# Patient Record
Sex: Male | Born: 1950 | Race: White | Hispanic: No | Marital: Married | Smoking: Former smoker
Health system: Southern US, Community
[De-identification: ages and names within clinical notes are randomized; demographics above are authoritative.]

## PROBLEM LIST (undated history)

## (undated) DIAGNOSIS — N400 Enlarged prostate without lower urinary tract symptoms: Secondary | ICD-10-CM

## (undated) DIAGNOSIS — K7290 Hepatic failure, unspecified without coma: Secondary | ICD-10-CM

## (undated) DIAGNOSIS — I10 Essential (primary) hypertension: Secondary | ICD-10-CM

## (undated) DIAGNOSIS — C801 Malignant (primary) neoplasm, unspecified: Secondary | ICD-10-CM

## (undated) DIAGNOSIS — J189 Pneumonia, unspecified organism: Secondary | ICD-10-CM

## (undated) DIAGNOSIS — E785 Hyperlipidemia, unspecified: Secondary | ICD-10-CM

## (undated) DIAGNOSIS — I2510 Atherosclerotic heart disease of native coronary artery without angina pectoris: Secondary | ICD-10-CM

## (undated) HISTORY — DX: Benign prostatic hyperplasia without lower urinary tract symptoms: N40.0

## (undated) HISTORY — DX: Essential (primary) hypertension: I10

## (undated) HISTORY — PX: CORONARY ARTERY BYPASS GRAFT: SHX141

## (undated) HISTORY — PX: LIVER TRANSPLANT: SHX410

## (undated) HISTORY — PX: COLONOSCOPY W/ POLYPECTOMY: SHX1380

## (undated) HISTORY — DX: Atherosclerotic heart disease of native coronary artery without angina pectoris: I25.10

## (undated) HISTORY — DX: Hyperlipidemia, unspecified: E78.5

---

## 2009-07-09 HISTORY — PX: CORONARY ARTERY BYPASS GRAFT: SHX141

## 2009-07-26 ENCOUNTER — Encounter: Payer: Self-pay | Admitting: Cardiothoracic Surgery

## 2009-07-26 ENCOUNTER — Ambulatory Visit: Payer: Self-pay | Admitting: Cardiothoracic Surgery

## 2009-07-26 ENCOUNTER — Ambulatory Visit (HOSPITAL_COMMUNITY): Admission: RE | Admit: 2009-07-26 | Discharge: 2009-07-26 | Payer: Self-pay | Admitting: Cardiology

## 2009-07-31 ENCOUNTER — Inpatient Hospital Stay (HOSPITAL_COMMUNITY): Admission: RE | Admit: 2009-07-31 | Discharge: 2009-08-05 | Payer: Self-pay | Admitting: Cardiothoracic Surgery

## 2009-08-10 ENCOUNTER — Ambulatory Visit: Payer: Self-pay | Admitting: Cardiothoracic Surgery

## 2009-08-17 ENCOUNTER — Ambulatory Visit: Payer: Self-pay | Admitting: Cardiothoracic Surgery

## 2009-08-24 ENCOUNTER — Encounter: Admission: RE | Admit: 2009-08-24 | Discharge: 2009-08-24 | Payer: Self-pay | Admitting: Cardiothoracic Surgery

## 2009-08-24 ENCOUNTER — Ambulatory Visit: Payer: Self-pay | Admitting: Cardiothoracic Surgery

## 2009-08-27 ENCOUNTER — Ambulatory Visit: Payer: Self-pay | Admitting: Oncology

## 2009-08-30 ENCOUNTER — Encounter (HOSPITAL_COMMUNITY): Admission: RE | Admit: 2009-08-30 | Discharge: 2009-09-07 | Payer: Self-pay | Admitting: Cardiology

## 2009-08-30 LAB — CBC WITH DIFFERENTIAL/PLATELET
BASO%: 1.5 % (ref 0.0–2.0)
Basophils Absolute: 0.1 10*3/uL (ref 0.0–0.1)
EOS%: 14 % — ABNORMAL HIGH (ref 0.0–7.0)
Eosinophils Absolute: 1.1 10*3/uL — ABNORMAL HIGH (ref 0.0–0.5)
HCT: 38.5 % (ref 38.4–49.9)
HGB: 12.9 g/dL — ABNORMAL LOW (ref 13.0–17.1)
LYMPH%: 27.8 % (ref 14.0–49.0)
MCH: 31.8 pg (ref 27.2–33.4)
MCHC: 33.5 g/dL (ref 32.0–36.0)
MCV: 94.9 fL (ref 79.3–98.0)
MONO#: 0.9 10*3/uL (ref 0.1–0.9)
MONO%: 11.4 % (ref 0.0–14.0)
NEUT#: 3.5 10*3/uL (ref 1.5–6.5)
NEUT%: 45.3 % (ref 39.0–75.0)
Platelets: 240 10*3/uL (ref 140–400)
RBC: 4.05 10*6/uL — ABNORMAL LOW (ref 4.20–5.82)
RDW: 14.3 % (ref 11.0–14.6)
WBC: 7.7 10*3/uL (ref 4.0–10.3)
lymph#: 2.1 10*3/uL (ref 0.9–3.3)

## 2009-09-02 LAB — PROTHROMBIN TIME
INR: 1.28 (ref <1.50)
Prothrombin Time: 15.9 seconds — ABNORMAL HIGH (ref 11.6–15.2)

## 2009-09-02 LAB — HEPATIC FUNCTION PANEL
ALT: 24 U/L (ref 0–53)
AST: 30 U/L (ref 0–37)
Albumin: 3.9 g/dL (ref 3.5–5.2)
Alkaline Phosphatase: 92 U/L (ref 39–117)
Bilirubin, Direct: 0.2 mg/dL (ref 0.0–0.3)
Indirect Bilirubin: 0.4 mg/dL (ref 0.0–0.9)
Total Bilirubin: 0.6 mg/dL (ref 0.3–1.2)
Total Protein: 7.4 g/dL (ref 6.0–8.3)

## 2009-09-02 LAB — VON WILLEBRAND PANEL
Factor-VIII Activity: 120 % (ref 50–150)
Ristocetin-Cofactor: 99 % (ref 50–150)
Von Willebrand Ag: 246 % normal — ABNORMAL HIGH (ref 61–164)

## 2009-09-02 LAB — APTT: aPTT: 33 seconds (ref 24–37)

## 2009-09-02 LAB — GAMMA GT: GGT: 199 U/L — ABNORMAL HIGH (ref 7–51)

## 2009-09-02 LAB — FIBRINOGEN: Fibrinogen: 396 mg/dL (ref 204–475)

## 2009-09-03 ENCOUNTER — Ambulatory Visit: Payer: Self-pay | Admitting: Cardiothoracic Surgery

## 2009-09-27 ENCOUNTER — Ambulatory Visit: Payer: Self-pay | Admitting: Oncology

## 2009-10-03 ENCOUNTER — Ambulatory Visit: Payer: Self-pay | Admitting: Cardiothoracic Surgery

## 2010-12-11 LAB — PREPARE CRYOPRECIPITATE

## 2010-12-11 LAB — CBC
HCT: 22.4 % — ABNORMAL LOW (ref 39.0–52.0)
HCT: 23.1 % — ABNORMAL LOW (ref 39.0–52.0)
HCT: 23.8 % — ABNORMAL LOW (ref 39.0–52.0)
HCT: 24.1 % — ABNORMAL LOW (ref 39.0–52.0)
HCT: 24.4 % — ABNORMAL LOW (ref 39.0–52.0)
HCT: 25.5 % — ABNORMAL LOW (ref 39.0–52.0)
HCT: 25.8 % — ABNORMAL LOW (ref 39.0–52.0)
HCT: 26.1 % — ABNORMAL LOW (ref 39.0–52.0)
HCT: 26.3 % — ABNORMAL LOW (ref 39.0–52.0)
HCT: 26.5 % — ABNORMAL LOW (ref 39.0–52.0)
HCT: 27.1 % — ABNORMAL LOW (ref 39.0–52.0)
HCT: 29.5 % — ABNORMAL LOW (ref 39.0–52.0)
HCT: 43.8 % (ref 39.0–52.0)
Hemoglobin: 10.6 g/dL — ABNORMAL LOW (ref 13.0–17.0)
Hemoglobin: 15.4 g/dL (ref 13.0–17.0)
Hemoglobin: 8 g/dL — ABNORMAL LOW (ref 13.0–17.0)
Hemoglobin: 8.2 g/dL — ABNORMAL LOW (ref 13.0–17.0)
Hemoglobin: 8.5 g/dL — ABNORMAL LOW (ref 13.0–17.0)
Hemoglobin: 8.5 g/dL — ABNORMAL LOW (ref 13.0–17.0)
Hemoglobin: 8.7 g/dL — ABNORMAL LOW (ref 13.0–17.0)
Hemoglobin: 8.9 g/dL — ABNORMAL LOW (ref 13.0–17.0)
Hemoglobin: 9.1 g/dL — ABNORMAL LOW (ref 13.0–17.0)
Hemoglobin: 9.1 g/dL — ABNORMAL LOW (ref 13.0–17.0)
Hemoglobin: 9.3 g/dL — ABNORMAL LOW (ref 13.0–17.0)
Hemoglobin: 9.3 g/dL — ABNORMAL LOW (ref 13.0–17.0)
Hemoglobin: 9.5 g/dL — ABNORMAL LOW (ref 13.0–17.0)
MCHC: 34.7 g/dL (ref 30.0–36.0)
MCHC: 34.8 g/dL (ref 30.0–36.0)
MCHC: 35 g/dL (ref 30.0–36.0)
MCHC: 35 g/dL (ref 30.0–36.0)
MCHC: 35.3 g/dL (ref 30.0–36.0)
MCHC: 35.3 g/dL (ref 30.0–36.0)
MCHC: 35.3 g/dL (ref 30.0–36.0)
MCHC: 35.5 g/dL (ref 30.0–36.0)
MCHC: 35.5 g/dL (ref 30.0–36.0)
MCHC: 35.6 g/dL (ref 30.0–36.0)
MCHC: 35.6 g/dL (ref 30.0–36.0)
MCHC: 35.7 g/dL (ref 30.0–36.0)
MCHC: 35.8 g/dL (ref 30.0–36.0)
MCV: 93.6 fL (ref 78.0–100.0)
MCV: 94.1 fL (ref 78.0–100.0)
MCV: 94.1 fL (ref 78.0–100.0)
MCV: 94.1 fL (ref 78.0–100.0)
MCV: 94.5 fL (ref 78.0–100.0)
MCV: 94.6 fL (ref 78.0–100.0)
MCV: 95 fL (ref 78.0–100.0)
MCV: 95.1 fL (ref 78.0–100.0)
MCV: 95.3 fL (ref 78.0–100.0)
MCV: 95.5 fL (ref 78.0–100.0)
MCV: 95.6 fL (ref 78.0–100.0)
MCV: 96.1 fL (ref 78.0–100.0)
MCV: 96.5 fL (ref 78.0–100.0)
Platelets: 105 10*3/uL — ABNORMAL LOW (ref 150–400)
Platelets: 108 10*3/uL — ABNORMAL LOW (ref 150–400)
Platelets: 113 10*3/uL — ABNORMAL LOW (ref 150–400)
Platelets: 115 10*3/uL — ABNORMAL LOW (ref 150–400)
Platelets: 115 10*3/uL — ABNORMAL LOW (ref 150–400)
Platelets: 119 10*3/uL — ABNORMAL LOW (ref 150–400)
Platelets: 121 10*3/uL — ABNORMAL LOW (ref 150–400)
Platelets: 124 10*3/uL — ABNORMAL LOW (ref 150–400)
Platelets: 130 10*3/uL — ABNORMAL LOW (ref 150–400)
Platelets: 148 10*3/uL — ABNORMAL LOW (ref 150–400)
Platelets: 167 10*3/uL (ref 150–400)
Platelets: 172 10*3/uL (ref 150–400)
Platelets: 92 10*3/uL — ABNORMAL LOW (ref 150–400)
RBC: 2.34 MIL/uL — ABNORMAL LOW (ref 4.22–5.81)
RBC: 2.42 MIL/uL — ABNORMAL LOW (ref 4.22–5.81)
RBC: 2.51 MIL/uL — ABNORMAL LOW (ref 4.22–5.81)
RBC: 2.58 MIL/uL — ABNORMAL LOW (ref 4.22–5.81)
RBC: 2.58 MIL/uL — ABNORMAL LOW (ref 4.22–5.81)
RBC: 2.71 MIL/uL — ABNORMAL LOW (ref 4.22–5.81)
RBC: 2.72 MIL/uL — ABNORMAL LOW (ref 4.22–5.81)
RBC: 2.74 MIL/uL — ABNORMAL LOW (ref 4.22–5.81)
RBC: 2.74 MIL/uL — ABNORMAL LOW (ref 4.22–5.81)
RBC: 2.79 MIL/uL — ABNORMAL LOW (ref 4.22–5.81)
RBC: 2.87 MIL/uL — ABNORMAL LOW (ref 4.22–5.81)
RBC: 3.14 MIL/uL — ABNORMAL LOW (ref 4.22–5.81)
RBC: 4.55 MIL/uL (ref 4.22–5.81)
RDW: 12.1 % (ref 11.5–15.5)
RDW: 12.2 % (ref 11.5–15.5)
RDW: 12.2 % (ref 11.5–15.5)
RDW: 12.3 % (ref 11.5–15.5)
RDW: 12.3 % (ref 11.5–15.5)
RDW: 12.4 % (ref 11.5–15.5)
RDW: 12.5 % (ref 11.5–15.5)
RDW: 13.1 % (ref 11.5–15.5)
RDW: 13.8 % (ref 11.5–15.5)
RDW: 13.8 % (ref 11.5–15.5)
RDW: 13.9 % (ref 11.5–15.5)
RDW: 13.9 % (ref 11.5–15.5)
RDW: 14.1 % (ref 11.5–15.5)
WBC: 10.6 10*3/uL — ABNORMAL HIGH (ref 4.0–10.5)
WBC: 11.4 10*3/uL — ABNORMAL HIGH (ref 4.0–10.5)
WBC: 11.4 10*3/uL — ABNORMAL HIGH (ref 4.0–10.5)
WBC: 5.8 10*3/uL (ref 4.0–10.5)
WBC: 5.9 10*3/uL (ref 4.0–10.5)
WBC: 6.3 10*3/uL (ref 4.0–10.5)
WBC: 6.4 10*3/uL (ref 4.0–10.5)
WBC: 6.6 10*3/uL (ref 4.0–10.5)
WBC: 6.6 10*3/uL (ref 4.0–10.5)
WBC: 7.3 10*3/uL (ref 4.0–10.5)
WBC: 8.7 10*3/uL (ref 4.0–10.5)
WBC: 8.9 10*3/uL (ref 4.0–10.5)
WBC: 9.5 10*3/uL (ref 4.0–10.5)

## 2010-12-11 LAB — POCT I-STAT, CHEM 8
BUN: 11 mg/dL (ref 6–23)
BUN: 12 mg/dL (ref 6–23)
BUN: 18 mg/dL (ref 6–23)
BUN: 9 mg/dL (ref 6–23)
Calcium, Ion: 1.02 mmol/L — ABNORMAL LOW (ref 1.12–1.32)
Calcium, Ion: 1.09 mmol/L — ABNORMAL LOW (ref 1.12–1.32)
Calcium, Ion: 1.09 mmol/L — ABNORMAL LOW (ref 1.12–1.32)
Calcium, Ion: 1.16 mmol/L (ref 1.12–1.32)
Chloride: 103 mEq/L (ref 96–112)
Chloride: 103 mEq/L (ref 96–112)
Chloride: 103 mEq/L (ref 96–112)
Chloride: 95 mEq/L — ABNORMAL LOW (ref 96–112)
Creatinine, Ser: 0.6 mg/dL (ref 0.4–1.5)
Creatinine, Ser: 0.7 mg/dL (ref 0.4–1.5)
Creatinine, Ser: 0.8 mg/dL (ref 0.4–1.5)
Creatinine, Ser: 0.8 mg/dL (ref 0.4–1.5)
Glucose, Bld: 113 mg/dL — ABNORMAL HIGH (ref 70–99)
Glucose, Bld: 116 mg/dL — ABNORMAL HIGH (ref 70–99)
Glucose, Bld: 122 mg/dL — ABNORMAL HIGH (ref 70–99)
Glucose, Bld: 125 mg/dL — ABNORMAL HIGH (ref 70–99)
HCT: 22 % — ABNORMAL LOW (ref 39.0–52.0)
HCT: 22 % — ABNORMAL LOW (ref 39.0–52.0)
HCT: 26 % — ABNORMAL LOW (ref 39.0–52.0)
HCT: 28 % — ABNORMAL LOW (ref 39.0–52.0)
Hemoglobin: 7.5 g/dL — ABNORMAL LOW (ref 13.0–17.0)
Hemoglobin: 7.5 g/dL — ABNORMAL LOW (ref 13.0–17.0)
Hemoglobin: 8.8 g/dL — ABNORMAL LOW (ref 13.0–17.0)
Hemoglobin: 9.5 g/dL — ABNORMAL LOW (ref 13.0–17.0)
Potassium: 3.6 mEq/L (ref 3.5–5.1)
Potassium: 4.1 mEq/L (ref 3.5–5.1)
Potassium: 4.4 mEq/L (ref 3.5–5.1)
Potassium: 4.5 mEq/L (ref 3.5–5.1)
Sodium: 139 mEq/L (ref 135–145)
Sodium: 139 mEq/L (ref 135–145)
Sodium: 139 mEq/L (ref 135–145)
Sodium: 141 mEq/L (ref 135–145)
TCO2: 25 mmol/L (ref 0–100)
TCO2: 26 mmol/L (ref 0–100)
TCO2: 27 mmol/L (ref 0–100)
TCO2: 31 mmol/L (ref 0–100)

## 2010-12-11 LAB — POCT I-STAT 4, (NA,K, GLUC, HGB,HCT)
Glucose, Bld: 102 mg/dL — ABNORMAL HIGH (ref 70–99)
Glucose, Bld: 102 mg/dL — ABNORMAL HIGH (ref 70–99)
Glucose, Bld: 104 mg/dL — ABNORMAL HIGH (ref 70–99)
Glucose, Bld: 114 mg/dL — ABNORMAL HIGH (ref 70–99)
Glucose, Bld: 115 mg/dL — ABNORMAL HIGH (ref 70–99)
Glucose, Bld: 119 mg/dL — ABNORMAL HIGH (ref 70–99)
Glucose, Bld: 151 mg/dL — ABNORMAL HIGH (ref 70–99)
Glucose, Bld: 159 mg/dL — ABNORMAL HIGH (ref 70–99)
Glucose, Bld: 94 mg/dL (ref 70–99)
HCT: 20 % — ABNORMAL LOW (ref 39.0–52.0)
HCT: 28 % — ABNORMAL LOW (ref 39.0–52.0)
HCT: 30 % — ABNORMAL LOW (ref 39.0–52.0)
HCT: 31 % — ABNORMAL LOW (ref 39.0–52.0)
HCT: 31 % — ABNORMAL LOW (ref 39.0–52.0)
HCT: 32 % — ABNORMAL LOW (ref 39.0–52.0)
HCT: 33 % — ABNORMAL LOW (ref 39.0–52.0)
HCT: 41 % (ref 39.0–52.0)
HCT: 43 % (ref 39.0–52.0)
Hemoglobin: 10.2 g/dL — ABNORMAL LOW (ref 13.0–17.0)
Hemoglobin: 10.5 g/dL — ABNORMAL LOW (ref 13.0–17.0)
Hemoglobin: 10.5 g/dL — ABNORMAL LOW (ref 13.0–17.0)
Hemoglobin: 10.9 g/dL — ABNORMAL LOW (ref 13.0–17.0)
Hemoglobin: 11.2 g/dL — ABNORMAL LOW (ref 13.0–17.0)
Hemoglobin: 13.9 g/dL (ref 13.0–17.0)
Hemoglobin: 14.6 g/dL (ref 13.0–17.0)
Hemoglobin: 6.8 g/dL — CL (ref 13.0–17.0)
Hemoglobin: 9.5 g/dL — ABNORMAL LOW (ref 13.0–17.0)
Potassium: 3.9 mEq/L (ref 3.5–5.1)
Potassium: 4.1 mEq/L (ref 3.5–5.1)
Potassium: 4.2 mEq/L (ref 3.5–5.1)
Potassium: 4.7 mEq/L (ref 3.5–5.1)
Potassium: 4.7 mEq/L (ref 3.5–5.1)
Potassium: 4.7 mEq/L (ref 3.5–5.1)
Potassium: 4.8 mEq/L (ref 3.5–5.1)
Potassium: 5.4 mEq/L — ABNORMAL HIGH (ref 3.5–5.1)
Potassium: 5.5 mEq/L — ABNORMAL HIGH (ref 3.5–5.1)
Sodium: 132 mEq/L — ABNORMAL LOW (ref 135–145)
Sodium: 132 mEq/L — ABNORMAL LOW (ref 135–145)
Sodium: 133 mEq/L — ABNORMAL LOW (ref 135–145)
Sodium: 134 mEq/L — ABNORMAL LOW (ref 135–145)
Sodium: 138 mEq/L (ref 135–145)
Sodium: 138 mEq/L (ref 135–145)
Sodium: 138 mEq/L (ref 135–145)
Sodium: 138 mEq/L (ref 135–145)
Sodium: 139 mEq/L (ref 135–145)

## 2010-12-11 LAB — BASIC METABOLIC PANEL
BUN: 11 mg/dL (ref 6–23)
BUN: 15 mg/dL (ref 6–23)
BUN: 9 mg/dL (ref 6–23)
CO2: 27 mEq/L (ref 19–32)
CO2: 28 mEq/L (ref 19–32)
CO2: 33 mEq/L — ABNORMAL HIGH (ref 19–32)
Calcium: 7.2 mg/dL — ABNORMAL LOW (ref 8.4–10.5)
Calcium: 7.5 mg/dL — ABNORMAL LOW (ref 8.4–10.5)
Calcium: 7.5 mg/dL — ABNORMAL LOW (ref 8.4–10.5)
Chloride: 104 mEq/L (ref 96–112)
Chloride: 107 mEq/L (ref 96–112)
Chloride: 99 mEq/L (ref 96–112)
Creatinine, Ser: 0.73 mg/dL (ref 0.4–1.5)
Creatinine, Ser: 0.81 mg/dL (ref 0.4–1.5)
Creatinine, Ser: 0.83 mg/dL (ref 0.4–1.5)
GFR calc Af Amer: >60 mL/min (ref >60)
GFR calc Af Amer: >60 mL/min (ref >60)
GFR calc Af Amer: >60 mL/min (ref >60)
GFR calc non Af Amer: >60 mL/min (ref >60)
GFR calc non Af Amer: >60 mL/min (ref >60)
GFR calc non Af Amer: >60 mL/min (ref >60)
Glucose, Bld: 119 mg/dL — ABNORMAL HIGH (ref 70–99)
Glucose, Bld: 128 mg/dL — ABNORMAL HIGH (ref 70–99)
Glucose, Bld: 146 mg/dL — ABNORMAL HIGH (ref 70–99)
Potassium: 3.3 mEq/L — ABNORMAL LOW (ref 3.5–5.1)
Potassium: 3.6 mEq/L (ref 3.5–5.1)
Potassium: 4.1 mEq/L (ref 3.5–5.1)
Sodium: 137 mEq/L (ref 135–145)
Sodium: 137 mEq/L (ref 135–145)
Sodium: 139 mEq/L (ref 135–145)

## 2010-12-11 LAB — PROTIME-INR
INR: 1.18 (ref 0.00–1.49)
INR: 1.47 (ref 0.00–1.49)
INR: 1.62 — ABNORMAL HIGH (ref 0.00–1.49)
Prothrombin Time: 14.9 seconds (ref 11.6–15.2)
Prothrombin Time: 17.7 seconds — ABNORMAL HIGH (ref 11.6–15.2)
Prothrombin Time: 19.1 seconds — ABNORMAL HIGH (ref 11.6–15.2)

## 2010-12-11 LAB — TYPE AND SCREEN
ABO/RH(D): O POS
Antibody Screen: NEGATIVE

## 2010-12-11 LAB — PREPARE FRESH FROZEN PLASMA

## 2010-12-11 LAB — POCT I-STAT 3, ART BLOOD GAS (G3+)
Acid-Base Excess: 1 mmol/L (ref 0.0–2.0)
Acid-Base Excess: 2 mmol/L (ref 0.0–2.0)
Acid-Base Excess: 2 mmol/L (ref 0.0–2.0)
Acid-Base Excess: 3 mmol/L — ABNORMAL HIGH (ref 0.0–2.0)
Acid-Base Excess: 4 mmol/L — ABNORMAL HIGH (ref 0.0–2.0)
Acid-base deficit: 1 mmol/L (ref 0.0–2.0)
Acid-base deficit: 1 mmol/L (ref 0.0–2.0)
Acid-base deficit: 2 mmol/L (ref 0.0–2.0)
Acid-base deficit: 3 mmol/L — ABNORMAL HIGH (ref 0.0–2.0)
Bicarbonate: 22.4 mEq/L (ref 20.0–24.0)
Bicarbonate: 23.7 mEq/L (ref 20.0–24.0)
Bicarbonate: 24.2 mEq/L — ABNORMAL HIGH (ref 20.0–24.0)
Bicarbonate: 24.6 mEq/L — ABNORMAL HIGH (ref 20.0–24.0)
Bicarbonate: 25.1 mEq/L — ABNORMAL HIGH (ref 20.0–24.0)
Bicarbonate: 25.3 mEq/L — ABNORMAL HIGH (ref 20.0–24.0)
Bicarbonate: 26 mEq/L — ABNORMAL HIGH (ref 20.0–24.0)
Bicarbonate: 26.1 mEq/L — ABNORMAL HIGH (ref 20.0–24.0)
Bicarbonate: 26.7 mEq/L — ABNORMAL HIGH (ref 20.0–24.0)
Bicarbonate: 28.1 mEq/L — ABNORMAL HIGH (ref 20.0–24.0)
Bicarbonate: 28.2 mEq/L — ABNORMAL HIGH (ref 20.0–24.0)
O2 Saturation: 100 %
O2 Saturation: 100 %
O2 Saturation: 100 %
O2 Saturation: 80 %
O2 Saturation: 91 %
O2 Saturation: 92 %
O2 Saturation: 94 %
O2 Saturation: 98 %
O2 Saturation: 98 %
O2 Saturation: 98 %
O2 Saturation: 98 %
Patient temperature: 36.7
Patient temperature: 36.8
Patient temperature: 37
Patient temperature: 37.3
Patient temperature: 37.4
Patient temperature: 37.6
Patient temperature: 99.5
TCO2: 24 mmol/L (ref 0–100)
TCO2: 25 mmol/L (ref 0–100)
TCO2: 25 mmol/L (ref 0–100)
TCO2: 26 mmol/L (ref 0–100)
TCO2: 26 mmol/L (ref 0–100)
TCO2: 26 mmol/L (ref 0–100)
TCO2: 27 mmol/L (ref 0–100)
TCO2: 28 mmol/L (ref 0–100)
TCO2: 28 mmol/L (ref 0–100)
TCO2: 29 mmol/L (ref 0–100)
TCO2: 30 mmol/L (ref 0–100)
pCO2 arterial: 38.6 mmHg (ref 35.0–45.0)
pCO2 arterial: 39 mmHg (ref 35.0–45.0)
pCO2 arterial: 40.3 mmHg (ref 35.0–45.0)
pCO2 arterial: 40.9 mmHg (ref 35.0–45.0)
pCO2 arterial: 40.9 mmHg (ref 35.0–45.0)
pCO2 arterial: 41.8 mmHg (ref 35.0–45.0)
pCO2 arterial: 42.4 mmHg (ref 35.0–45.0)
pCO2 arterial: 42.6 mmHg (ref 35.0–45.0)
pCO2 arterial: 44.8 mmHg (ref 35.0–45.0)
pCO2 arterial: 45 mmHg (ref 35.0–45.0)
pCO2 arterial: 48.9 mmHg — ABNORMAL HIGH (ref 35.0–45.0)
pH, Arterial: 7.33 — ABNORMAL LOW (ref 7.350–7.450)
pH, Arterial: 7.335 — ABNORMAL LOW (ref 7.350–7.450)
pH, Arterial: 7.347 — ABNORMAL LOW (ref 7.350–7.450)
pH, Arterial: 7.371 (ref 7.350–7.450)
pH, Arterial: 7.38 (ref 7.350–7.450)
pH, Arterial: 7.384 (ref 7.350–7.450)
pH, Arterial: 7.408 (ref 7.350–7.450)
pH, Arterial: 7.426 (ref 7.350–7.450)
pH, Arterial: 7.432 (ref 7.350–7.450)
pH, Arterial: 7.432 (ref 7.350–7.450)
pH, Arterial: 7.435 (ref 7.350–7.450)
pO2, Arterial: 103 mmHg — ABNORMAL HIGH (ref 80.0–100.0)
pO2, Arterial: 105 mmHg — ABNORMAL HIGH (ref 80.0–100.0)
pO2, Arterial: 111 mmHg — ABNORMAL HIGH (ref 80.0–100.0)
pO2, Arterial: 112 mmHg — ABNORMAL HIGH (ref 80.0–100.0)
pO2, Arterial: 174 mmHg — ABNORMAL HIGH (ref 80.0–100.0)
pO2, Arterial: 174 mmHg — ABNORMAL HIGH (ref 80.0–100.0)
pO2, Arterial: 323 mmHg — ABNORMAL HIGH (ref 80.0–100.0)
pO2, Arterial: 48 mmHg — ABNORMAL LOW (ref 80.0–100.0)
pO2, Arterial: 64 mmHg — ABNORMAL LOW (ref 80.0–100.0)
pO2, Arterial: 65 mmHg — ABNORMAL LOW (ref 80.0–100.0)
pO2, Arterial: 76 mmHg — ABNORMAL LOW (ref 80.0–100.0)

## 2010-12-11 LAB — URINALYSIS, ROUTINE W REFLEX MICROSCOPIC
Bilirubin Urine: NEGATIVE
Glucose, UA: NEGATIVE mg/dL
Hgb urine dipstick: NEGATIVE
Ketones, ur: NEGATIVE mg/dL
Nitrite: NEGATIVE
Protein, ur: NEGATIVE mg/dL
Specific Gravity, Urine: 1.014 (ref 1.005–1.030)
Urobilinogen, UA: 0.2 mg/dL (ref 0.0–1.0)
pH: 5 (ref 5.0–8.0)

## 2010-12-11 LAB — CROSSMATCH
ABO/RH(D): O POS
Antibody Screen: NEGATIVE

## 2010-12-11 LAB — BLOOD GAS, ARTERIAL
Acid-Base Excess: 0.2 mmol/L (ref 0.0–2.0)
Bicarbonate: 24 mEq/L (ref 20.0–24.0)
Drawn by: 206361
FIO2: 0.21 %
O2 Saturation: 95.5 %
Patient temperature: 98.6
TCO2: 25.1 mmol/L (ref 0–100)
pCO2 arterial: 36.7 mmHg (ref 35.0–45.0)
pH, Arterial: 7.432 (ref 7.350–7.450)
pO2, Arterial: 73.7 mmHg — ABNORMAL LOW (ref 80.0–100.0)

## 2010-12-11 LAB — MAGNESIUM
Magnesium: 2.1 mg/dL (ref 1.5–2.5)
Magnesium: 2.1 mg/dL (ref 1.5–2.5)
Magnesium: 2.1 mg/dL (ref 1.5–2.5)
Magnesium: 2.6 mg/dL — ABNORMAL HIGH (ref 1.5–2.5)
Magnesium: 2.6 mg/dL — ABNORMAL HIGH (ref 1.5–2.5)

## 2010-12-11 LAB — COMPREHENSIVE METABOLIC PANEL
ALT: 45 U/L (ref 0–53)
AST: 50 U/L — ABNORMAL HIGH (ref 0–37)
Albumin: 3.6 g/dL (ref 3.5–5.2)
Alkaline Phosphatase: 56 U/L (ref 39–117)
BUN: 11 mg/dL (ref 6–23)
CO2: 23 mEq/L (ref 19–32)
Calcium: 8.6 mg/dL (ref 8.4–10.5)
Chloride: 110 mEq/L (ref 96–112)
Creatinine, Ser: 0.71 mg/dL (ref 0.4–1.5)
GFR calc Af Amer: >60 mL/min (ref >60)
GFR calc non Af Amer: >60 mL/min (ref >60)
Glucose, Bld: 144 mg/dL — ABNORMAL HIGH (ref 70–99)
Potassium: 3.9 mEq/L (ref 3.5–5.1)
Sodium: 139 mEq/L (ref 135–145)
Total Bilirubin: 1.4 mg/dL — ABNORMAL HIGH (ref 0.3–1.2)
Total Protein: 7.1 g/dL (ref 6.0–8.3)

## 2010-12-11 LAB — CREATININE, SERUM
Creatinine, Ser: 0.73 mg/dL (ref 0.4–1.5)
Creatinine, Ser: 0.78 mg/dL (ref 0.4–1.5)
Creatinine, Ser: 0.82 mg/dL (ref 0.4–1.5)
GFR calc Af Amer: >60 mL/min (ref >60)
GFR calc Af Amer: >60 mL/min (ref >60)
GFR calc Af Amer: >60 mL/min (ref >60)
GFR calc non Af Amer: >60 mL/min (ref >60)
GFR calc non Af Amer: >60 mL/min (ref >60)
GFR calc non Af Amer: >60 mL/min (ref >60)

## 2010-12-11 LAB — GLUCOSE, CAPILLARY
Glucose-Capillary: 109 mg/dL — ABNORMAL HIGH (ref 70–99)
Glucose-Capillary: 111 mg/dL — ABNORMAL HIGH (ref 70–99)
Glucose-Capillary: 113 mg/dL — ABNORMAL HIGH (ref 70–99)
Glucose-Capillary: 115 mg/dL — ABNORMAL HIGH (ref 70–99)
Glucose-Capillary: 116 mg/dL — ABNORMAL HIGH (ref 70–99)
Glucose-Capillary: 118 mg/dL — ABNORMAL HIGH (ref 70–99)
Glucose-Capillary: 121 mg/dL — ABNORMAL HIGH (ref 70–99)
Glucose-Capillary: 123 mg/dL — ABNORMAL HIGH (ref 70–99)
Glucose-Capillary: 123 mg/dL — ABNORMAL HIGH (ref 70–99)
Glucose-Capillary: 124 mg/dL — ABNORMAL HIGH (ref 70–99)
Glucose-Capillary: 129 mg/dL — ABNORMAL HIGH (ref 70–99)
Glucose-Capillary: 130 mg/dL — ABNORMAL HIGH (ref 70–99)
Glucose-Capillary: 134 mg/dL — ABNORMAL HIGH (ref 70–99)
Glucose-Capillary: 139 mg/dL — ABNORMAL HIGH (ref 70–99)
Glucose-Capillary: 144 mg/dL — ABNORMAL HIGH (ref 70–99)
Glucose-Capillary: 145 mg/dL — ABNORMAL HIGH (ref 70–99)
Glucose-Capillary: 88 mg/dL (ref 70–99)
Glucose-Capillary: 97 mg/dL (ref 70–99)

## 2010-12-11 LAB — APTT
aPTT: 34 seconds (ref 24–37)
aPTT: 35 seconds (ref 24–37)
aPTT: 40 seconds — ABNORMAL HIGH (ref 24–37)

## 2010-12-11 LAB — MRSA PCR SCREENING: MRSA by PCR: NEGATIVE

## 2010-12-11 LAB — DIFFERENTIAL
Basophils Absolute: 0 10*3/uL (ref 0.0–0.1)
Basophils Relative: 1 % (ref 0–1)
Eosinophils Absolute: 0.4 10*3/uL (ref 0.0–0.7)
Eosinophils Relative: 6 % — ABNORMAL HIGH (ref 0–5)
Lymphocytes Relative: 24 % (ref 12–46)
Lymphs Abs: 1.8 10*3/uL (ref 0.7–4.0)
Monocytes Absolute: 1.1 10*3/uL — ABNORMAL HIGH (ref 0.1–1.0)
Monocytes Relative: 15 % — ABNORMAL HIGH (ref 3–12)
Neutro Abs: 4 10*3/uL (ref 1.7–7.7)
Neutrophils Relative %: 55 % (ref 43–77)

## 2010-12-11 LAB — HEMOGLOBIN AND HEMATOCRIT, BLOOD
HCT: 29.7 % — ABNORMAL LOW (ref 39.0–52.0)
Hemoglobin: 10.6 g/dL — ABNORMAL LOW (ref 13.0–17.0)

## 2010-12-11 LAB — PREPARE PLATELETS

## 2010-12-11 LAB — DIC (DISSEMINATED INTRAVASCULAR COAGULATION)PANEL
D-Dimer, Quant: 0.79 ug/mL-FEU — ABNORMAL HIGH (ref 0.00–0.48)
Fibrinogen: 184 mg/dL — ABNORMAL LOW (ref 204–475)
INR: 1.41 (ref 0.00–1.49)
Platelets: 116 10*3/uL — ABNORMAL LOW (ref 150–400)
Prothrombin Time: 17.1 seconds — ABNORMAL HIGH (ref 11.6–15.2)
Smear Review: NONE SEEN
aPTT: 38 seconds — ABNORMAL HIGH (ref 24–37)

## 2010-12-11 LAB — PREPARE RBC (CROSSMATCH)

## 2010-12-11 LAB — HEPATIC FUNCTION PANEL
ALT: 36 U/L (ref 0–53)
AST: 40 U/L — ABNORMAL HIGH (ref 0–37)
Albumin: 2.9 g/dL — ABNORMAL LOW (ref 3.5–5.2)
Alkaline Phosphatase: 56 U/L (ref 39–117)
Bilirubin, Direct: 0.5 mg/dL — ABNORMAL HIGH (ref 0.0–0.3)
Indirect Bilirubin: 0.8 mg/dL (ref 0.3–0.9)
Total Bilirubin: 1.3 mg/dL — ABNORMAL HIGH (ref 0.3–1.2)
Total Protein: 5.6 g/dL — ABNORMAL LOW (ref 6.0–8.3)

## 2010-12-11 LAB — ABO/RH: ABO/RH(D): O POS

## 2010-12-11 LAB — HEMOGLOBIN A1C
Hgb A1c MFr Bld: 5.5 % (ref 4.6–6.1)
Mean Plasma Glucose: 111 mg/dL

## 2010-12-11 LAB — PLATELET COUNT: Platelets: 142 10*3/uL — ABNORMAL LOW (ref 150–400)

## 2011-01-21 NOTE — Assessment & Plan Note (Signed)
OFFICE VISIT   Brandon [name], Brandon [name]  DOB:  02/17/1951                                        September 03, 2009  CHART #:  [id number]   HISTORY OF PRESENT ILLNESS:  The patient comes in today for followup  wound check.  He is status post coronary artery bypass grafting on  July 31, 2009, with a return to the operating room the following day  for reexploration for bleeding.  He was seen in the office by Dr. Donata Clay approximately 10 days ago and at that time was noted to have some  mild erythema and tenderness of the leg incision and lower sternal  incision.  He was treated with a 7-day course of Keflex 500 mg t.i.d.  Since that time, the patient has noted significant improvement in his  wounds.  He has been cleaning them daily as directed and had no further  tenderness in the area.  He is ambulating more and actually is attending  his first cardiac rehab session today.  He has seen the cardiologist,  and his amiodarone dose has been decreased to daily.  He is maintaining  normal sinus rhythm.  He does have a followup appointment in January for  an additional cardiology reevaluation and hopefully at that time they  will be able to discontinue the amiodarone altogether.  He also  completed and a course of Lasix and states that since that time he has  had no further issues with lower extremity edema.  Overall, he is  progressing well.   PHYSICAL EXAMINATION:  Vital Signs:  Blood pressure is 129/75, pulse is  70 and regular, respirations 18, O2 sat 95% on room air.  Extremities:  Examination of the sternal and leg wounds reveal no erythema or  drainage.  The wounds are healing well.  The leg wound has almost  completely granulated and is only superficially separated at this point.  Lower extremity show very minimal if any edema.  Heart:  Regular rate  and rhythm without murmurs, rubs or gallops.  Sternum is stable to  palpation.  Lungs:  Clear.   ASSESSMENT  AND PLAN:  The patient is doing well status post coronary  artery bypass grafting and reexploration for bleeding.  His wounds  appear to be healed at this time, and there is no further evidence of  infection.  He is asked to continue his current medication regimen until  he sees the cardiologist in January.  We will plan on seeing him back on  a p.r.n. basis unless any other problems arise.  He may start cardiac  rehab today as planned.  He is also asked to continue  with his weight restriction of no lifting anything over 20 pounds until  a full 3 months after surgery.   Kerin Perna, M.D.  Electronically Signed   GC/MEDQ  D:  09/03/2009  T:  09/04/2009  Job:  [id number]   cc:   Jake Bathe, MD  Emeterio Reeve, MD

## 2011-01-21 NOTE — Assessment & Plan Note (Signed)
OFFICE VISIT   Brandon [name], CART  DOB:  05-17-1951                                        August 10, 2009  CHART #:  [id number]   The patient comes in today to the office complaining of discomfort  around his [facility] site on the right leg.  He was discharged home on  August 05, 2009 and at the time of discharge he was significantly  volume overloaded with marked swelling in his lower extremities.  He was  discharged home on Lasix 40 mg daily x5 days and has completed that  course at this point.  He states that his weight has been trending down,  although he remains about 4-5 pounds above his regular baseline weight.  He does report continued lower extremity edema.  His main complaints are  shooting pains around his EVH site and in the anterior calf between the  [facility] incisions.  He states that when he walks, initially upon  weightbearing, he will have burning and stinging in this area, which  ultimately resolves as he ambulates.  He has had no posterior calf  tenderness.  No fevers, chills, or erythema from the area.   PHYSICAL EXAMINATION:  Vital Signs:  Blood pressure is 110/62, pulse is  68, respirations 16, temperature 98.5, and O2 sat 97%.  Extremities:  His right lower extremity EVH incisions are healing well.  There is an  area of ecchymosis between the incision, just below the knee and the  smaller stab incision of the mid calf, which is tender to palpation.  There is no obvious fluctuance and no posterior calf tenderness.  There  is still 1+ edema of the right lower extremity with only minimal left  lower extremity edema.   ASSESSMENT/PLAN:  The patient is generally doing well, status post  coronary artery bypass grafting.  I feel that the discomfort he is  feeling in his lower leg is related to his EVH tunnel site and there is  probably a fluid collection or small hematoma in the lower leg.  This  does not appear to be infected and it appears to be  exacerbated by his  general volume overload.  I have written him a prescription for an  additional 5 days worth of Lasix and potassium and asked him to keep his  legs elevated when he is not ambulating.  He also has not been taking  his pain medication and I have encouraged him to use that as needed to  help with the discomfort.  I have also asked him to monitor for any  further swelling, any posterior tenderness, shortness of breath, or  fever.  He has a followup appointment with his cardiologist in about 10  days and with Dr. Donata Clay the following week and I have asked him to  keep these appointments.  He also may call our office at any time in the  interim if he experiences further difficulties.   Kerin Perna, M.D.  Electronically Signed   GC/MEDQ  D:  08/10/2009  T:  08/10/2009  Job:  [id number]

## 2011-01-21 NOTE — Assessment & Plan Note (Signed)
OFFICE VISIT   Brandon, [name]  DOB:  1951/01/27                                        August 24, 2009  CHART #:  [id number]   CURRENT PROBLEMS:  1. Status post urgent multivessel bypass grafting, July 31, 2009,      for class III angina and left main with three-vessel coronary      artery disease.  2. Postoperative coagulopathy requiring re-exploration for bleeding.  3. History of hypertension.   PRESENT ILLNESS:  The patient is a very nice 60 year old Caucasian male,  ex-smoker who was found to have left main three-vessel disease, on  cardiac catheterization by Dr. Anne Fu.  He was brought in for bypass  grafting.  At which time, the left IMA was grafted to his LAD and vein  grafts were placed to his posterior descending, obtuse marginal, and  ramus intermediate branch of the left coronary artery.  He had a  significant coagulopathy despite normalization of his clotting factors  and required re-exploration for evacuation of mediastinal hematoma.  Since returning home, he has shown significant improvement.  He has had  no recurrent angina and the surgical incisions are healing.  He has had  no shortness of breath or fluid retention and he has maintained a sinus  rhythm.   MEDICATIONS:  He has been on aspirin 325 mg without any bleeding  problems.  His other discharge medications include amiodarone 200 mg  b.i.d., metoprolol 25 mg b.i.d., Crestor 20 mg daily, lisinopril 10 mg  daily, multivitamin 1 daily, and p.r.n. pain medication.   PHYSICAL EXAMINATION:  Blood pressure 108/68, pulse 63, respirations 18,  and saturation 98% on room air.  He is alert and pleasant.  Breath  sounds are clear and equal.  The sternum is stable and well healed.  There is a small suture granuloma at the lower aspect of the incision,  which is cleaned with peroxide and Neosporin Band-Aid dressing.  His  heart rhythm is regular without S3 gallop or murmur.  The leg  incision  has an eschar with some surrounding erythema and tenderness, but no  drainage.  This is cleaned with peroxide and saline and a Neosporin  dressing applied.  There is minimal if any ankle edema.   PA and lateral chest x-ray reveals clear lung fields, no pleural  effusions and the cardiac silhouette is stable.   IMPRESSION AND PLAN:  The patient has had good progress in the first 3  weeks following discharge from the hospital.  He is instructed to resume  driving and light activities and to begin outpatient rehab.  He knows  not to lift more than 20 pounds until 3 months after surgery.  Because  he had such a significant coagulopathy without any preoperative Plavix  or other unusual precipitating factors, he will be referred to the  Hematology Clinic for evaluation of possible von Willebrand disease.  Otherwise, he will return here in approximately 10 days for a wound  check of his leg and lower sternum.  He was given a course of oral  Keflex 500 mg p.o. t.i.d. for 7 days.   Kerin Perna, M.D.  Electronically Signed   PV/MEDQ  D:  08/24/2009  T:  08/25/2009  Job:  [id number]   cc:   Jake Bathe, MD  Emeterio Reeve, MD

## 2011-01-21 NOTE — Assessment & Plan Note (Signed)
OFFICE VISIT   Brandon [name]  DOB:  01-26-1951                                        October 03, 2009  CHART #:  [id number]   HISTORY:  The patient is a 60 year old white male who underwent coronary  artery bypass grafting on July 31, 2009.  He is seen on today's date  in the office due to a small amount of drainage from his sternal  incision that occurred 2 days ago.  He denies fevers, chills, or other  constitutional symptoms.  He states the drainage was pink and green in  discoloration.  He placed a Band-Aid on it and the drainage stopped.  He  denies noting any sternal instability or clicking.   PHYSICAL EXAMINATION:  Vital Signs:  Blood pressure 138/78, pulse 74,  respirations 16, and oxygen saturation is 97% on room air.  Chest:  The  sternal incision is inspected.  There is a small, approximately 1 mm  area of scabbing in the midportion of the sternal incision.  This scab  was removed and a small portion of suture material was removed.  There  is no obvious purulence.  There is no erythema.  The sternum is stable  to palpation with no click.  Cardiac:  Regular rate and rhythm.  Normal  S1 and S2.  Pulmonary:  Clear breath sounds throughout.  Lower  Extremities:  No edema.  The endoscopic vein harvest site is well  healed.   ASSESSMENT:  A small stitch abscess in the sternal incision, now status  post debridement.   Plan at this time is to follow on a p.r.n. basis.   Brandon [name], P.A.-C.   Brandon [name]  D:  10/03/2009  T:  10/04/2009  Job:  [id number]   cc:   Jake Bathe, MD  Emeterio Reeve, MD

## 2011-06-08 IMAGING — CR DG CHEST 1V PORT
1 series · 1 of 1 positions shown · non-contrast
Comparison: 07/26/2009.

CLINICAL DATA: Coronary artery disease.  CABG.

PORTABLE CHEST - 1 VIEW

[view not recorded]
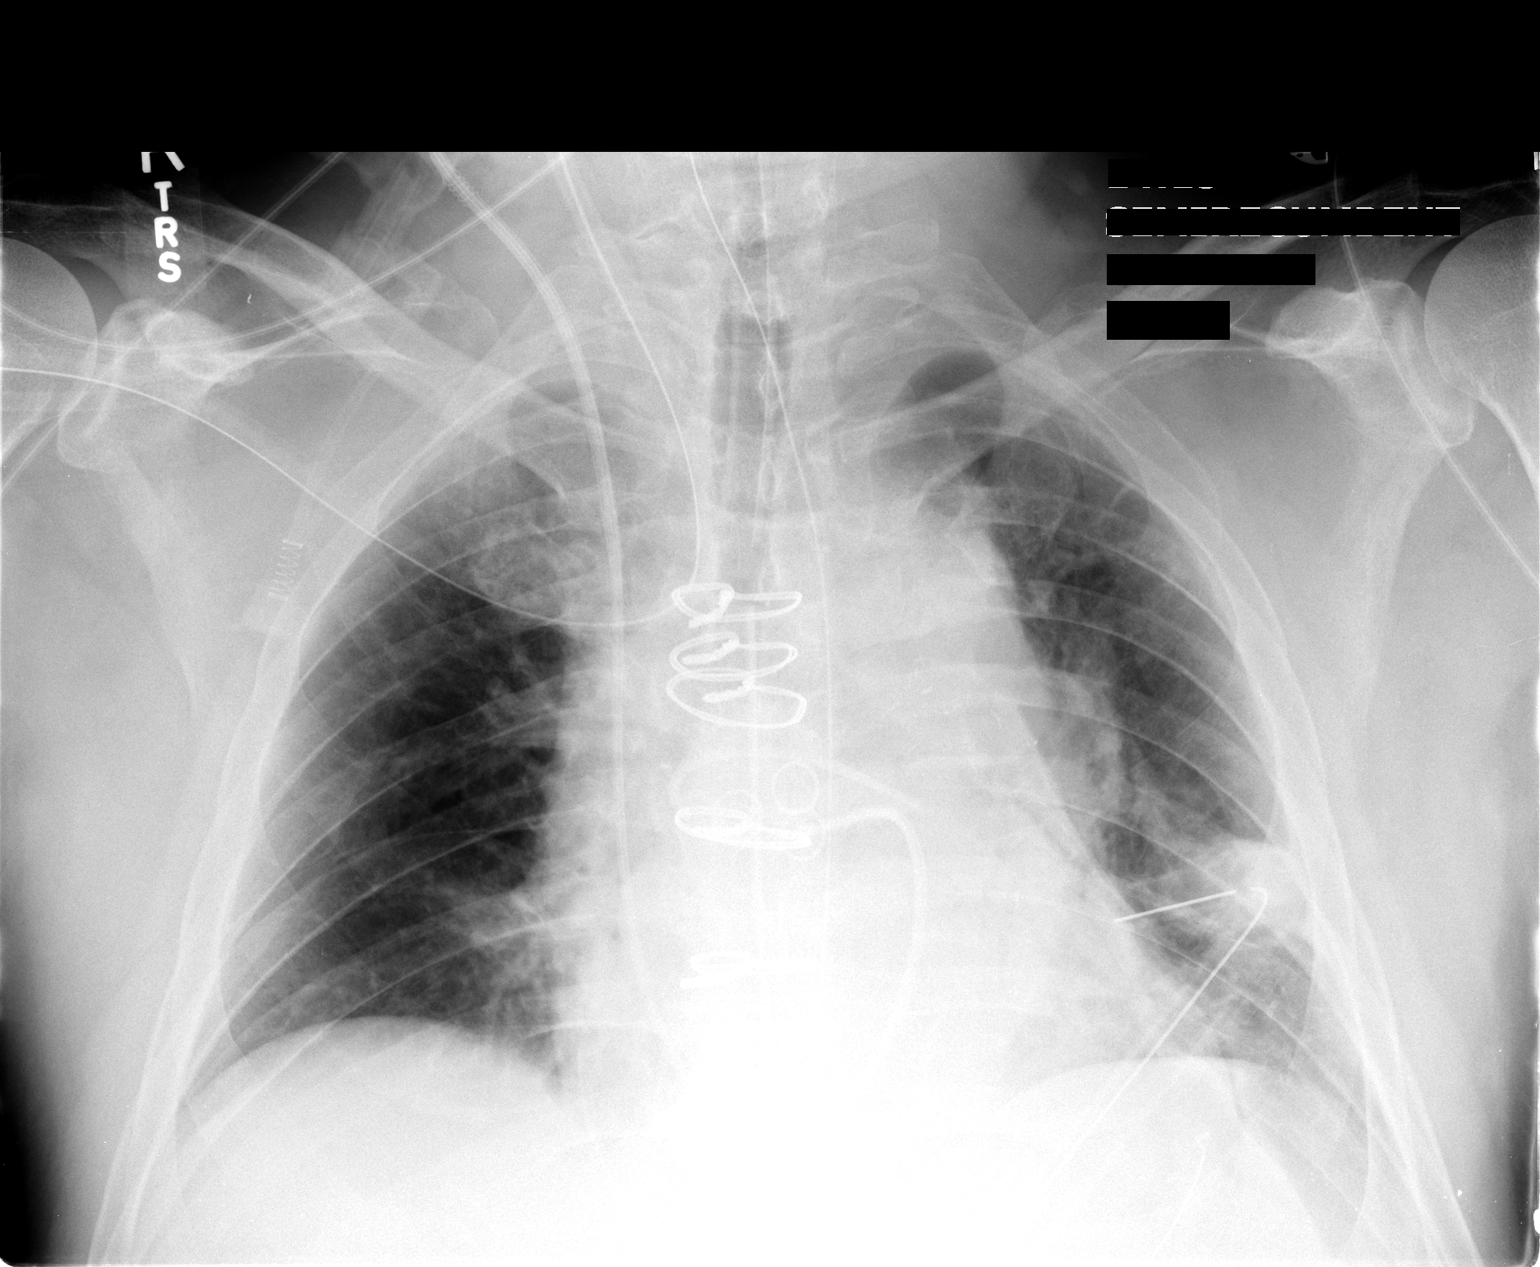

[1 of 1 positions shown; findings below may reference images not displayed]

FINDINGS: Lung volumes are lower than on the preoperative
examination.  There is an endotracheal tube in good position with
the tip 2.7 cm from the carina.  Mediastinal drain and left
thoracostomy tube are present.  Right IJ Swan-Ganz catheter is
present with the tip in the left descending pulmonary artery.
Interval changes of CABG.  Mild bibasilar atelectasis.  There is
present along the left heart border, likely representing a small
amount of pneumopericardium. Nasogastric tube present within the
esophagus.
IMPRESSION: 1.  Postoperative changes of CABG.
2.  Support apparatus as described above in good position.  Swan-
Ganz catheter is in the left pulmonary artery.

## 2011-06-09 IMAGING — CR DG CHEST 1V PORT
1 series · 1 of 1 positions shown · non-contrast
Comparison: 07/31/2009.

CLINICAL DATA: Post CABG.

PORTABLE CHEST - 1 VIEW

[AP]
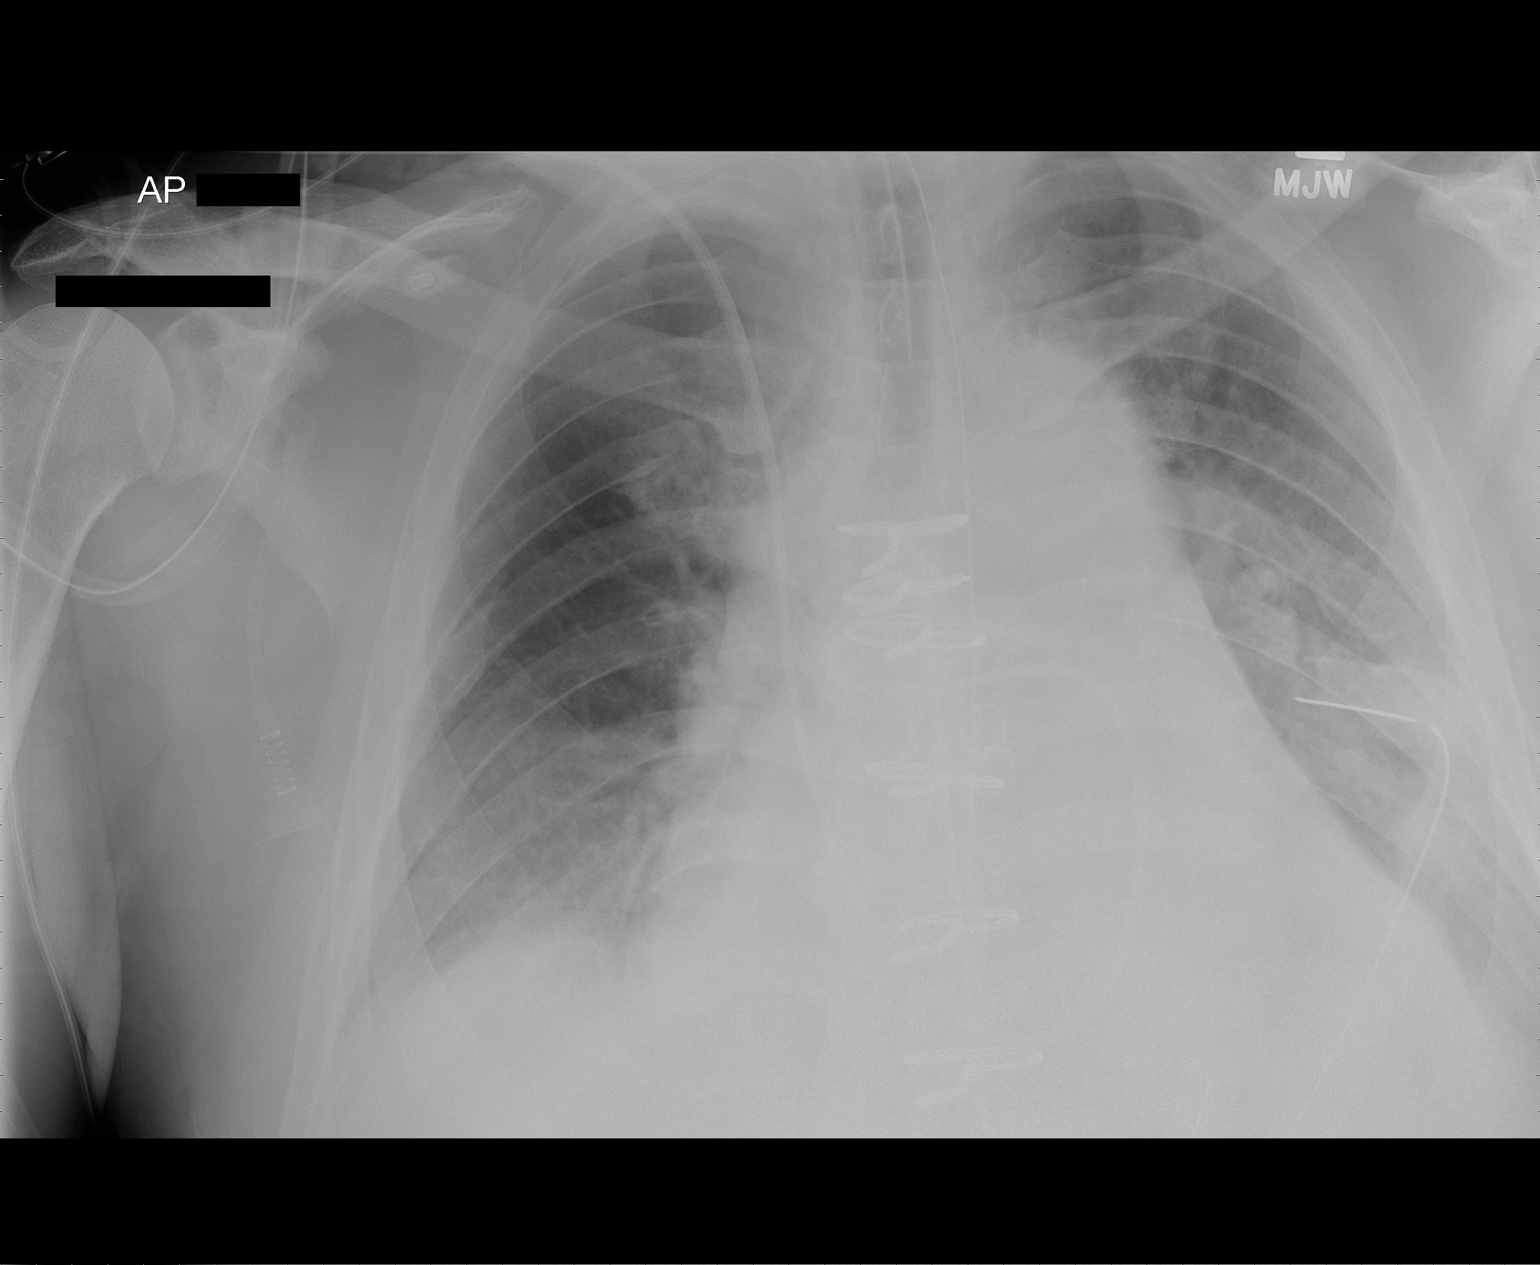

[1 of 1 positions shown; findings below may reference images not displayed]

FINDINGS: Interval increase in bibasilar atelectasis.  No
pneumothorax or definite heart failure.  The cardiac silhouette
remains enlarged.

Support apparatus unchanged.
IMPRESSION: 1.  Stable cardiomegaly without definite heart failure.
2.  Interval increase in bibasilar atelectasis.

## 2011-06-10 IMAGING — CR DG CHEST 1V PORT
1 series · 1 of 1 positions shown · non-contrast
Comparison: the previous day's study

CLINICAL DATA: Coronary bypass grafting

PORTABLE CHEST - 1 VIEW

[AP]
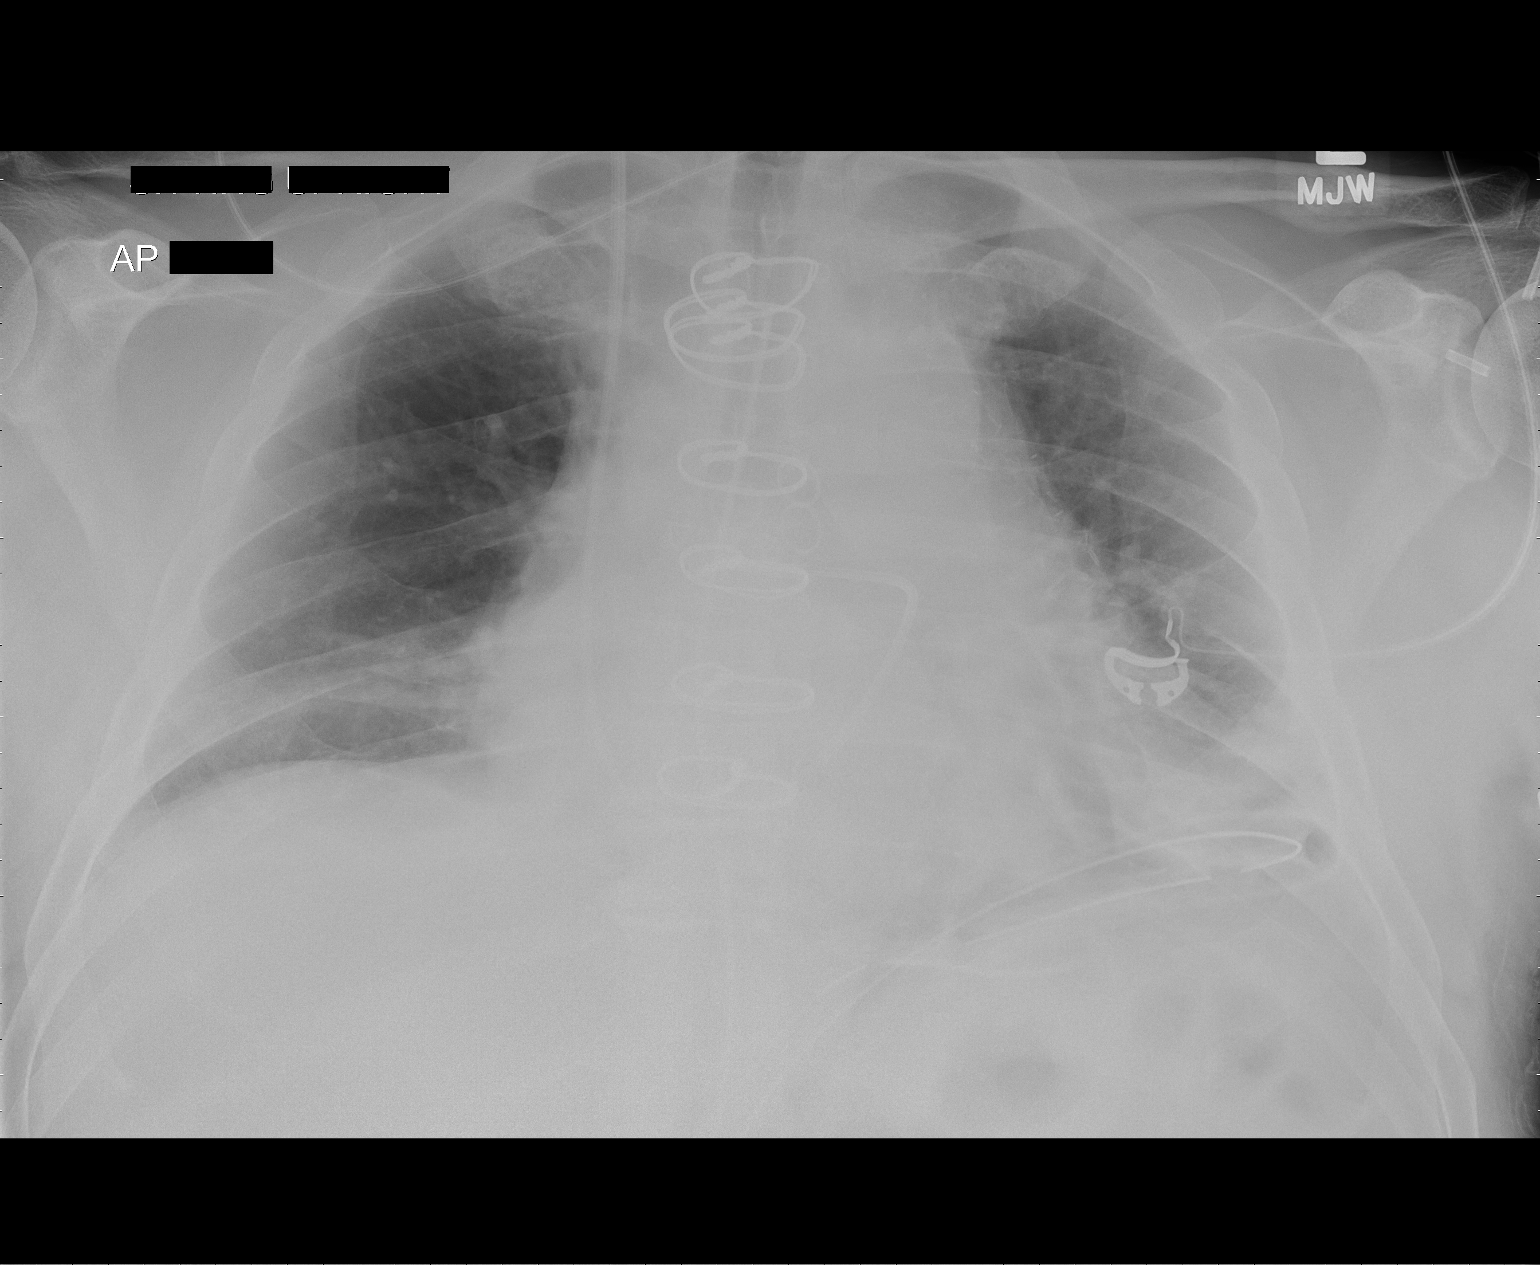

[1 of 1 positions shown; findings below may reference images not displayed]

FINDINGS: The patient has been extubated and the nasogastric tube
removed.  Left chest tube remains in place with no pneumothorax.
Right IJ Fabiana and mediastinal drain are stable in position.
Changes of CABG.  Low lung volumes.  Patchy perihilar and bibasilar
subsegmental atelectasis or infiltrates, left greater than right,
not significantly changed from the prior study.  No definite
effusion.
IMPRESSION: 1.  Extubation with little change in asymmetric bibasilar
infiltrates or atelectasis.
2.  Remaining   hardware stable in position.

## 2015-09-09 HISTORY — PX: LIVER TRANSPLANT: SHX410

## 2015-11-01 DIAGNOSIS — Z944 Liver transplant status: Secondary | ICD-10-CM | POA: Insufficient documentation

## 2017-12-10 ENCOUNTER — Encounter: Payer: Self-pay | Admitting: Internal Medicine

## 2018-04-15 DIAGNOSIS — G4733 Obstructive sleep apnea (adult) (pediatric): Secondary | ICD-10-CM | POA: Diagnosis not present

## 2018-04-25 DIAGNOSIS — S91011A Laceration without foreign body, right ankle, initial encounter: Secondary | ICD-10-CM | POA: Diagnosis not present

## 2018-04-25 DIAGNOSIS — S99912A Unspecified injury of left ankle, initial encounter: Secondary | ICD-10-CM | POA: Diagnosis not present

## 2018-04-25 DIAGNOSIS — Z7901 Long term (current) use of anticoagulants: Secondary | ICD-10-CM | POA: Diagnosis not present

## 2018-04-25 DIAGNOSIS — Z944 Liver transplant status: Secondary | ICD-10-CM | POA: Diagnosis not present

## 2018-04-25 DIAGNOSIS — S91012A Laceration without foreign body, left ankle, initial encounter: Secondary | ICD-10-CM | POA: Diagnosis not present

## 2018-05-12 DIAGNOSIS — Z4802 Encounter for removal of sutures: Secondary | ICD-10-CM | POA: Diagnosis not present

## 2018-05-14 DIAGNOSIS — E7219 Other disorders of sulfur-bearing amino-acid metabolism: Secondary | ICD-10-CM | POA: Diagnosis not present

## 2018-05-14 DIAGNOSIS — G4733 Obstructive sleep apnea (adult) (pediatric): Secondary | ICD-10-CM | POA: Diagnosis not present

## 2018-05-14 DIAGNOSIS — J398 Other specified diseases of upper respiratory tract: Secondary | ICD-10-CM | POA: Diagnosis not present

## 2018-06-15 DIAGNOSIS — C221 Intrahepatic bile duct carcinoma: Secondary | ICD-10-CM | POA: Diagnosis not present

## 2018-06-15 DIAGNOSIS — Z944 Liver transplant status: Secondary | ICD-10-CM | POA: Diagnosis not present

## 2018-06-21 DIAGNOSIS — W1789XA Other fall from one level to another, initial encounter: Secondary | ICD-10-CM | POA: Diagnosis not present

## 2018-06-21 DIAGNOSIS — Z888 Allergy status to other drugs, medicaments and biological substances status: Secondary | ICD-10-CM | POA: Diagnosis not present

## 2018-06-21 DIAGNOSIS — W1839XA Other fall on same level, initial encounter: Secondary | ICD-10-CM | POA: Diagnosis not present

## 2018-06-21 DIAGNOSIS — S2241XA Multiple fractures of ribs, right side, initial encounter for closed fracture: Secondary | ICD-10-CM | POA: Diagnosis not present

## 2018-06-21 DIAGNOSIS — R0781 Pleurodynia: Secondary | ICD-10-CM | POA: Diagnosis not present

## 2018-06-21 DIAGNOSIS — Z87891 Personal history of nicotine dependence: Secondary | ICD-10-CM | POA: Diagnosis not present

## 2018-06-21 DIAGNOSIS — S2231XA Fracture of one rib, right side, initial encounter for closed fracture: Secondary | ICD-10-CM | POA: Diagnosis not present

## 2018-07-16 ENCOUNTER — Ambulatory Visit (INDEPENDENT_AMBULATORY_CARE_PROVIDER_SITE_OTHER): Payer: Medicare Other | Admitting: Family Medicine

## 2018-07-16 ENCOUNTER — Encounter: Payer: Self-pay | Admitting: Family Medicine

## 2018-07-16 VITALS — BP 138/74 | HR 88 | Temp 98.7°F | Ht 70.0 in | Wt 239.8 lb

## 2018-07-16 DIAGNOSIS — N401 Enlarged prostate with lower urinary tract symptoms: Secondary | ICD-10-CM

## 2018-07-16 DIAGNOSIS — N3943 Post-void dribbling: Secondary | ICD-10-CM

## 2018-07-16 DIAGNOSIS — Z951 Presence of aortocoronary bypass graft: Secondary | ICD-10-CM

## 2018-07-16 DIAGNOSIS — R519 Headache, unspecified: Secondary | ICD-10-CM | POA: Insufficient documentation

## 2018-07-16 DIAGNOSIS — R51 Headache: Secondary | ICD-10-CM | POA: Diagnosis not present

## 2018-07-16 DIAGNOSIS — Z944 Liver transplant status: Secondary | ICD-10-CM | POA: Diagnosis not present

## 2018-07-16 DIAGNOSIS — I2510 Atherosclerotic heart disease of native coronary artery without angina pectoris: Secondary | ICD-10-CM

## 2018-07-16 DIAGNOSIS — G4733 Obstructive sleep apnea (adult) (pediatric): Secondary | ICD-10-CM

## 2018-07-16 DIAGNOSIS — N529 Male erectile dysfunction, unspecified: Secondary | ICD-10-CM | POA: Diagnosis not present

## 2018-07-16 DIAGNOSIS — Z23 Encounter for immunization: Secondary | ICD-10-CM

## 2018-07-16 MED ORDER — TADALAFIL 5 MG PO TABS: 5.0000 mg | ORAL_TABLET | Freq: Every day | ORAL | 5 refills | Status: DC

## 2018-07-16 NOTE — Assessment & Plan Note (Signed)
Stable.  Referral placed to sleep medicine.

## 2018-07-16 NOTE — Assessment & Plan Note (Signed)
Stable.  Will be following up with [facility].

## 2018-07-16 NOTE — Assessment & Plan Note (Signed)
Start Cialis 5 mg daily given concurrent BPH.  Discussed possible side effects and reasons to return to care.

## 2018-07-16 NOTE — Assessment & Plan Note (Signed)
Stable.  Continue Topamax 25 mg daily. 

## 2018-07-16 NOTE — Assessment & Plan Note (Signed)
Start Cialis as noted above.  Discussed reasons to return to care.

## 2018-07-16 NOTE — Patient Instructions (Addendum)
It was very nice to see you today!  Please start the Cialis.  Please take 1 pill daily.    No other medication changes today.  I will refer you to cardiology and sleep medicine.  Come back to see me in the spring for your annual physical with blood work.  Come back to see me sooner as needed.  Take care, Dr Jerline Pain

## 2018-07-16 NOTE — Assessment & Plan Note (Signed)
Continue aspirin.  Not a candidate for statins due to liver transplant.  Referral placed to cardiology.

## 2018-07-16 NOTE — Progress Notes (Signed)
Subjective:  Brandon [name] is a 67 y.o. male who presents today with a chief complaint of Erectile Dysfunction and to establish care.   HPI:  Erectile dysfunction, chronic problem, new to provider Started about a year ago.  He has had increased urination and some dribbling.  No hesitancy.  No treatments tried in the past.  No penile discharge.  He is able to get normal erections during the morning.  No ejaculatory symptoms.  CAD status post CABG Currently on aspirin 81 mg daily.  Not on statin due to his history of liver transplant.  Has not seen a cardiologist for about a year.  Obesity He has lost about 100 pounds over the last year.  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  Currently on mycophenolate and Zortress.  He will be establishing with [facility] for monitoring.  Obstructive sleep apnea Several year history.  He has tried CPAP in the past but has not tolerated.  He would like referral to another sleep specialist here in [place] since he recently moved here.  Frequent headaches Several year history.  On Topamax 25 mg daily and tolerating well.  ROS: Per HPI, otherwise a complete review of systems was negative.   PMH:  The following were reviewed and entered/updated in epic: Past Medical History:  Diagnosis Date  . BPH (benign prostatic hyperplasia)   . Coronary artery disease   . Hyperlipidemia   . Hypertension    Patient Active Problem List   Diagnosis Date Noted  . OSA (obstructive sleep apnea) 07/16/2018  . Erectile dysfunction 07/16/2018  . Benign prostatic hyperplasia with post-void dribbling 07/16/2018  . Frequent headaches 07/16/2018  . Coronary artery disease without angina pectoris 07/16/2018  . S/P CABG (coronary artery bypass graft) 07/16/2018  . Liver transplant recipient [name] 11/01/2015   Past Surgical History:  Procedure Laterality Date  . CORONARY ARTERY BYPASS GRAFT    . LIVER TRANSPLANT       Family History  Problem Relation Age of Onset  . Heart disease Sister   . Heart disease Brother   . Prostate cancer Brother     Medications- reviewed and updated Current Outpatient Medications  Medication Sig Dispense Refill  . aspirin EC 81 MG tablet Take 81 mg by mouth daily.    . calcium gluconate 500 MG tablet Take 1 tablet by mouth 3 (three) times daily.    . Everolimus (ZORTRESS) 0.25 MG TABS Take 0.75 mg by mouth 2 (two) times daily.    . furosemide (LASIX) 40 MG tablet Take by mouth.    . Multiple Vitamin (MULTIVITAMIN WITH MINERALS) TABS tablet Take 1 tablet by mouth daily.    . Mycophenolate Sodium (MYCOPHENOLIC ACID PO) Take 676 mg by mouth 2 (two) times daily.    [name] Kitchen topiramate (TOPAMAX) 25 MG tablet Take by mouth.    . tadalafil (CIALIS) 5 MG tablet Take 1 tablet (5 mg total) by mouth daily. 30 tablet 5   No current facility-administered medications for this visit.     Allergies-reviewed and updated Allergies  Allergen Reactions  . Nsaids     Liver transplant 09/13/2015 Liver transplant 09/13/2015 Liver transplant 09/13/2015     Social History   Socioeconomic History  . Marital status: Married    Spouse name: Not on file  . Number of children: Not on file  . Years of education: Not on file  . Highest education level: Not on file  Occupational History  .  Not on file  Social Needs  . Financial resource strain: Not on file  . Food insecurity:    Worry: Not on file    Inability: Not on file  . Transportation needs:    Medical: Not on file    Non-medical: Not on file  Tobacco Use  . Smoking status: Former Research scientist (life sciences)  . Smokeless tobacco: Never Used  Substance and Sexual Activity  . Alcohol use: Not on file  . Drug use: Never  . Sexual activity: Yes  Lifestyle  . Physical activity:    Days per week: Not on file    Minutes per session: Not on file  . Stress: Not on file  Relationships  . Social connections:    Talks on phone: Not on file    Gets  together: Not on file    Attends religious service: Not on file    Active member of club or organization: Not on file    Attends meetings of clubs or organizations: Not on file    Relationship status: Not on file  Other Topics Concern  . Not on file  Social History Narrative  . Not on file   Objective:  Physical Exam: BP 138/74 (BP Location: Left Arm, Patient Position: Sitting, Cuff Size: Normal)   Pulse 88   Temp 98.7 F (37.1 C) (Oral)   Ht 5\' 10"  (1.778 m)   Wt 239 lb 12.8 oz (108.8 kg)   SpO2 95%   BMI 34.41 kg/m   Gen: NAD, resting comfortably CV: RRR with no murmurs appreciated Pulm: NWOB, CTAB with no crackles, wheezes, or rhonchi GI: Normal bowel sounds present. Soft, Nontender, Nondistended. MSK: No edema, cyanosis, or clubbing noted Skin: Warm, dry Neuro: Grossly normal, moves all extremities Psych: Normal affect and thought content  Assessment/Plan:  S/P CABG (coronary artery bypass graft) Continue aspirin.  Not a candidate for statins due to liver transplant.  Referral placed to cardiology.  OSA (obstructive sleep apnea) Stable.  Referral placed to sleep medicine.  Liver transplant recipient [facility]) Stable.  Will be following up with [facility].  Frequent headaches Stable.  Continue Topamax 25 mg daily.  Erectile dysfunction Start Cialis 5 mg daily given concurrent BPH.  Discussed possible side effects and reasons to return to care.  Benign prostatic hyperplasia with post-void dribbling Start Cialis as noted above.  Discussed reasons to return to care.  Preventative Healthcare Flu shot given today.  Patient will return soon for annual physical with blood work.  Algis Greenhouse. Jerline Pain, MD 07/16/2018 5:39 PM

## 2018-09-10 ENCOUNTER — Encounter: Payer: Self-pay | Admitting: Family Medicine

## 2018-09-10 ENCOUNTER — Ambulatory Visit (INDEPENDENT_AMBULATORY_CARE_PROVIDER_SITE_OTHER): Payer: Medicare Other | Admitting: Family Medicine

## 2018-09-10 VITALS — BP 128/74 | HR 78 | Temp 98.6°F | Ht 70.0 in | Wt 244.8 lb

## 2018-09-10 DIAGNOSIS — Z23 Encounter for immunization: Secondary | ICD-10-CM | POA: Diagnosis not present

## 2018-09-10 DIAGNOSIS — Z13220 Encounter for screening for lipoid disorders: Secondary | ICD-10-CM | POA: Diagnosis not present

## 2018-09-10 DIAGNOSIS — R21 Rash and other nonspecific skin eruption: Secondary | ICD-10-CM

## 2018-09-10 MED ORDER — TRIAMCINOLONE ACETONIDE 0.5 % EX OINT: 1.0000 "application " | TOPICAL_OINTMENT | Freq: Two times a day (BID) | CUTANEOUS | 0 refills | Status: DC

## 2018-09-10 NOTE — Progress Notes (Signed)
   Subjective:  Brandon [name] is a 68 y.o. male who presents today for same-day appointment with a chief complaint of rash .   HPI:  Rash, Acute problem Started about a week ago.  Located on right side of forehead and anterior scalp.  Associated with a burning sensation.  No pruritus.  He has been remodeling his house and has been working under his crawlspace as well.  He is not sure he has had any new exposures.  He has not tried anything for it.  Rash located to forehead scalp, and posterior ear.  No fevers or chills.  No recent illnesses.  No other obvious alleviating or aggravating factors.   ROS: Per HPI  PMH: He reports that he has quit smoking. He has never used smokeless tobacco. He reports that he does not use drugs. No history on file for alcohol.  Objective:  Physical Exam: BP 128/74 (BP Location: Left Arm, Patient Position: Sitting, Cuff Size: Large)   Pulse 78   Temp 98.6 F (37 C) (Oral)   Ht 5\' 10"  (1.778 m)   Wt 244 lb 12 oz (111 kg)   SpO2 99%   BMI 35.12 kg/m   Gen: NAD, resting comfortably HEEN: Scattered, indurated, erythematous lesions involving right forehead and right anterior scalp.  A few excoriations noted.  Approximately 8 x 10 cm erythematous lesion on superior scalp.  No areas of fluctuance.  No drainage.  Assessment/Plan:  Rash History and area distribution most consistent with contact dermatitis.  Will start topical triamcinolone cream twice daily for the next 1 to 2 weeks.  No red flags.  Distribution of rash not consistent with systemic reaction or drug reaction.  If does not improve with topical steroids, would consider punch biopsy versus dermatology referral.  Preventative Healthcare Patient will return for lipid panel.  Pneumovax given today. Health Maintenance Due  Topic Date Due  . Hepatitis C Screening  1950-11-22  . PNA vac Low Risk Adult (2 of 2 - PPSV23) 08/03/2018      Yazmeen Woolf M. Jerline Pain, MD 09/10/2018 10:38 AM

## 2018-09-10 NOTE — Patient Instructions (Addendum)
It was very nice to see you today!  Please start the triamcinolone twice daily for 1 week.  Let me know if worsening or not improving in the next week or so.  Please come back soon to have your cholesterol level checked.  We will give your flu shot today.  Take care, Dr Jerline Pain

## 2018-09-13 ENCOUNTER — Ambulatory Visit (INDEPENDENT_AMBULATORY_CARE_PROVIDER_SITE_OTHER): Payer: Medicare Other | Admitting: Family Medicine

## 2018-09-13 ENCOUNTER — Encounter: Payer: Self-pay | Admitting: Family Medicine

## 2018-09-13 VITALS — BP 132/74 | HR 77 | Temp 97.7°F | Ht 70.0 in | Wt 244.0 lb

## 2018-09-13 DIAGNOSIS — H01024 Squamous blepharitis left upper eyelid: Secondary | ICD-10-CM | POA: Diagnosis not present

## 2018-09-13 DIAGNOSIS — H01021 Squamous blepharitis right upper eyelid: Secondary | ICD-10-CM | POA: Diagnosis not present

## 2018-09-13 DIAGNOSIS — H01025 Squamous blepharitis left lower eyelid: Secondary | ICD-10-CM | POA: Diagnosis not present

## 2018-09-13 DIAGNOSIS — H25813 Combined forms of age-related cataract, bilateral: Secondary | ICD-10-CM | POA: Diagnosis not present

## 2018-09-13 DIAGNOSIS — B0230 Zoster ocular disease, unspecified: Secondary | ICD-10-CM

## 2018-09-13 DIAGNOSIS — B0239 Other herpes zoster eye disease: Secondary | ICD-10-CM | POA: Diagnosis not present

## 2018-09-13 DIAGNOSIS — H01022 Squamous blepharitis right lower eyelid: Secondary | ICD-10-CM | POA: Diagnosis not present

## 2018-09-13 MED ORDER — PREDNISONE 50 MG PO TABS: ORAL_TABLET | ORAL | 0 refills | Status: DC

## 2018-09-13 MED ORDER — VALACYCLOVIR HCL 1 G PO TABS: 1000.0000 mg | ORAL_TABLET | Freq: Three times a day (TID) | ORAL | 0 refills | Status: AC

## 2018-09-13 NOTE — Patient Instructions (Signed)
It was very nice to see you today!  You have shingles.  Please take the Valtrex and prednisone.  We will get you into see an eye doctor to make sure there is been no eye complications.  Please let me know if your symptoms worsen or do not improve within the next week on the medications.  Take care, Dr Jerline Pain

## 2018-09-13 NOTE — Progress Notes (Signed)
   Subjective:  Brandon [name] is a 68 y.o. male who presents today with a chief complaint of rash.   HPI:  Rash, new problem Seen 3 days ago. Started on triamcinolone 0.5% ointment bid for presumed contact dermatitis after working under his house for several days.  Unfortunately, symptoms have worsened since then.  He now has some involvement into his right eyelid.  No blurred vision.  No vision changes.  Lesions are very painful and associated with a burning sensation.  No pruritus.  No other treatments tried.  Pain is much worse when anything brushes against it.  No other obvious alleviating or aggravating factors.  ROS: Per HPI  PMH: He reports that he has quit smoking. He has never used smokeless tobacco. He reports that he does not use drugs. No history on file for alcohol.  Objective:  Physical Exam: BP 132/74 (BP Location: Left Arm, Patient Position: Sitting, Cuff Size: Normal)   Pulse 77   Temp 97.7 F (36.5 C) (Oral)   Ht 5\' 10"  (1.778 m)   Wt 244 lb (110.7 kg)   SpO2 97%   BMI 35.01 kg/m   Gen: NAD, resting comfortably HEENT: Scattered, indurated, erythematous lesions involving the right forehead, right anterior scalp, and right upper eyelid.  Few excoriated lesions noted.  No areas of fluctuance.  No drainage.  No vesicles.  Assessment/Plan:  Shingles History and distribution of rash concerning for possible zoster outbreak, even though he has no apparent vesicles.  He has some ocular involvement at this time as well.  Given that his rash seems to be worsening and his upper eyelid involvement, we will start a course of Valtrex 1000 mg 3 times daily x7 days and prednisone 50 mg daily x5 days.  We will also place urgent referral to ophthalmology.  Discussed reasons to return to care and seek emergent care.   Algis Greenhouse. Jerline Pain, MD 09/13/2018 2:48 PM

## 2018-09-14 ENCOUNTER — Telehealth: Payer: Self-pay | Admitting: Family Medicine

## 2018-09-14 NOTE — Telephone Encounter (Signed)
See note  Copied from [place] [phone]. Topic: General - Other >> Sep 14, 2018  8:09 AM Conception Chancy, NT wrote: Gwenlyn Perking, [place], is calling from [facility] requesting to speak with Dr. Jerline Pain in regards to seeing this patient yesterday 09/13/18.  [phone]

## 2018-09-14 NOTE — Telephone Encounter (Signed)
Discussed plan with PA. Pt has no signs of ocular involvement but they would like to extend course of valtrex for several more weeks and have him return in about a month for repeat assessment.  Algis Greenhouse. Jerline Pain, MD 09/14/2018 9:18 AM

## 2018-09-14 NOTE — Telephone Encounter (Signed)
[place] and Dr. Jerline Pain spoke with Gwenlyn Perking, [place].

## 2018-09-20 DIAGNOSIS — B0239 Other herpes zoster eye disease: Secondary | ICD-10-CM | POA: Diagnosis not present

## 2018-09-21 DIAGNOSIS — Z944 Liver transplant status: Secondary | ICD-10-CM | POA: Diagnosis not present

## 2018-09-27 ENCOUNTER — Ambulatory Visit (INDEPENDENT_AMBULATORY_CARE_PROVIDER_SITE_OTHER): Payer: Medicare Other | Admitting: Neurology

## 2018-09-27 ENCOUNTER — Encounter: Payer: Self-pay | Admitting: Neurology

## 2018-09-27 VITALS — BP 145/90 | HR 73 | Ht 72.0 in | Wt 238.0 lb

## 2018-09-27 DIAGNOSIS — R351 Nocturia: Secondary | ICD-10-CM | POA: Diagnosis not present

## 2018-09-27 DIAGNOSIS — Z951 Presence of aortocoronary bypass graft: Secondary | ICD-10-CM | POA: Diagnosis not present

## 2018-09-27 DIAGNOSIS — Z789 Other specified health status: Secondary | ICD-10-CM

## 2018-09-27 DIAGNOSIS — G4733 Obstructive sleep apnea (adult) (pediatric): Secondary | ICD-10-CM

## 2018-09-27 DIAGNOSIS — E669 Obesity, unspecified: Secondary | ICD-10-CM

## 2018-09-27 DIAGNOSIS — R51 Headache: Secondary | ICD-10-CM | POA: Diagnosis not present

## 2018-09-27 DIAGNOSIS — Z944 Liver transplant status: Secondary | ICD-10-CM | POA: Diagnosis not present

## 2018-09-27 DIAGNOSIS — G4719 Other hypersomnia: Secondary | ICD-10-CM | POA: Diagnosis not present

## 2018-09-27 DIAGNOSIS — R519 Headache, unspecified: Secondary | ICD-10-CM

## 2018-09-27 NOTE — Progress Notes (Signed)
Subjective:    Patient ID: Brandon [name] is a 68 y.o. male.  HPI     Star Age, MD, PhD [facility] [address] [place],  [address]   Dear Dr. Jerline Pain,  I saw your patient, Brandon [name], upon your kind request in my sleep clinic today for initial consultation of his sleep disorder, in particular, his prior diagnosis of OSA. The patient is unaccompanied today. As you know, Brandon [name] is a 68 year old right-handed gentleman with an underlying medical history of liver cirrhosis, s/p Liver Tx at [facility] in 2016, hypertension, hyperlipidemia, coronary artery disease, s/p 4 vessel CABG, BPH, recent shingles and obesity, who was previously diagnosed with obstructive sleep apnea and placed on CPAP therapy. He reports having difficulty with CPAP. He had prior home sleep testing out of state in [place]. Test results were reviewed today, he had a home sleep test on 12/10/2017 with a total recording time of 345.8 minutes, total AHI was 58 per hour, snoring was noted, O2 nadir was 74%. He was advised to proceed with a CPAP titration study. He reports that he no longer has a CPAP machine, he could not tolerate it. I also reviewed your office note from 09/13/2018. As I understand, he was placed on AutoPap therapy, tried a nasal mask and a full facemask could not tolerate the mask on his face. He does not believe he tried nasal pillows. He was also evaluated for the Inspire procedure and his insurance approved it, he was supposed to have surgery in September or October 2019 for this but postponed the surgery as they were in the process of moving. His bedtime is around 10:30 or 11, rise time between 6:30 and 7. He has nocturia about 2-3 times per average night. He has occasional morning headaches. He has a strong family history of OSA with 2 brothers and 2 sisters affected by OSA on CPAP therapies. He tried the AutoPap for about 3 months he believes. Of note, he  has been able to lose a significant amount of weight since last year, in the realm of 30 pounds. He goes to the gym about 3-4 times a week. He retired also. His Epworth sleepiness score is 13 out of 24, fatigue score is 12 out of 63. 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 He quit smoking about 21 years ago and does not drink alcohol any longer, drinks caffeine in the form of coffee, 1-2 cups per day on average. Has a daughter in college at [employer]. He has an appointment pending tomorrow with the [facility] liver transplant specialist. He is transferring care from [facility] to [place].  His Past Medical History Is Significant For: Past Medical History:  Diagnosis Date  . BPH (benign prostatic hyperplasia)   . Coronary artery disease   . Hyperlipidemia   . Hypertension     His Past Surgical History Is Significant For: Past Surgical History:  Procedure Laterality Date  . CORONARY ARTERY BYPASS GRAFT    . LIVER TRANSPLANT      His Family History Is Significant For: Family History  Problem Relation Age of Onset  . Heart disease Sister   . Heart disease Brother   . Prostate cancer Brother     His Social History Is Significant For: Social History   Socioeconomic History  . Marital status: Married    Spouse name: Not on file  . Number of children: Not  on file  . Years of education: Not on file  . Highest education level: Not on file  Occupational History  . Not on file  Social Needs  . Financial resource strain: Not on file  . Food insecurity:    Worry: Not on file    Inability: Not on file  . Transportation needs:    Medical: Not on file    Non-medical: Not on file  Tobacco Use  . Smoking status: Former Research scientist (life sciences)  . Smokeless tobacco: Never Used  Substance and Sexual Activity  . Alcohol use: Not on file  . Drug use: Never  . Sexual activity: Yes  Lifestyle  . Physical activity:    Days per week: Not on file    Minutes per  session: Not on file  . Stress: Not on file  Relationships  . Social connections:    Talks on phone: Not on file    Gets together: Not on file    Attends religious service: Not on file    Active member of club or organization: Not on file    Attends meetings of clubs or organizations: Not on file    Relationship status: Not on file  Other Topics Concern  . Not on file  Social History Narrative  . Not on file    His Allergies Are:  Allergies  Allergen Reactions  . Nsaids     Liver transplant 09/13/2015 Liver transplant 09/13/2015 Liver transplant 09/13/2015   :   His Current Medications Are:  Outpatient Encounter Medications as of 09/27/2018  Medication Sig  . aspirin EC 81 MG tablet Take 81 mg by mouth daily.  . calcium gluconate 500 MG tablet Take 1 tablet by mouth 3 (three) times daily.  . Everolimus (ZORTRESS) 0.25 MG TABS Take 0.75 mg by mouth 2 (two) times daily.  . furosemide (LASIX) 40 MG tablet Take by mouth.  . Multiple Vitamin (MULTIVITAMIN WITH MINERALS) TABS tablet Take 1 tablet by mouth daily.  . Mycophenolate Sodium (MYCOPHENOLIC ACID PO) Take 315 mg by mouth 2 (two) times daily.  . predniSONE (DELTASONE) 50 MG tablet Take 1 tablet daily for 5 days.  . tadalafil (CIALIS) 5 MG tablet Take 1 tablet (5 mg total) by mouth daily.  [name] Kitchen topiramate (TOPAMAX) 25 MG tablet Take by mouth.  . triamcinolone ointment (KENALOG) 0.5 % Apply 1 application topically 2 (two) times daily.  . valGANciclovir HCl (VALCYTE PO) Take by mouth.   No facility-administered encounter medications on file as of 09/27/2018.   :  Review of Systems:  Out of a complete 14 point review of systems, all are reviewed and negative with the exception of these symptoms as listed below:  Review of Systems  Neurological:       Pt presents today to discuss the Inspire procedure. Pt does not have a cpap. He could not tolerate the cpap. Pt had an HST in April of 2019.  Epworth Sleepiness Scale 0= would  never doze 1= slight chance of dozing 2= moderate chance of dozing 3= high chance of dozing  Sitting and reading: 2 Watching TV: 2 Sitting inactive in a public place (ex. Theater or meeting): 1 As a passenger in a car for an hour without a break: 3 Lying down to rest in the afternoon: 3 Sitting and talking to someone: 0 Sitting quietly after lunch (no alcohol): 2 In a car, while stopped in traffic: 0 Total: 13     Objective:  Neurological Exam  Physical Exam Physical  Examination:   Vitals:   09/27/18 0907  BP: (!) 145/90  Pulse: 73    General Examination: The patient is a very pleasant 68 y.o. male in no acute distress. He appears well-developed and well-nourished and well groomed.   HEENT: Normocephalic, atraumatic, pupils are equal, round and reactive to light and accommodation. Corrective eyeglasses in place. Extraocular tracking is good without limitation to gaze excursion or nystagmus noted. Normal smooth pursuit is noted. Hearing is grossly intact. Face is symmetric with normal facial animation and normal facial sensation. Speech is clear with no dysarthria noted. There is no hypophonia. There is no lip, neck/head, jaw or voice tremor. Neck shows FROM. There are no carotid bruits on auscultation. Oropharynx exam reveals: moderate mouth dryness, adequate dental hygiene and moderate airway crowding, due to large appearing tongue and larger uvula. Tonsils are absent. Mallampati is class I. Tongue protrudes centrally and palate elevates symmetrically. Neck size is 19 inches. He has a nearly absent overbite.   Chest: Clear to auscultation without wheezing, rhonchi or crackles noted.  Heart: S1+S2+0, regular and normal without murmurs, rubs or gallops noted.   Abdomen: Soft, non-tender and non-distended with normal bowel sounds appreciated on auscultation.  Extremities: There is 1+ pitting edema in the distal lower extremities bilaterally.  Skin: Warm and dry without trophic  changes noted.  Musculoskeletal: exam reveals no obvious joint deformities, tenderness or joint swelling or erythema.   Neurologically:  Mental status: The patient is awake, alert and oriented in all 4 spheres. His immediate and remote memory, attention, language skills and fund of knowledge are appropriate. There is no evidence of aphasia, agnosia, apraxia or anomia. Speech is clear with normal prosody and enunciation. Thought process is linear. Mood is normal and affect is normal.  Cranial nerves II - XII are as described above under HEENT exam. In addition: shoulder shrug is normal with equal shoulder height noted. Motor exam: Normal bulk, strength and tone is noted. There is no drift, tremor or rebound. Romberg is negative. Fine motor skills and coordination: grossly intact.  Cerebellar testing: No dysmetria or intention tremor on finger to nose testing. Heel to shin is unremarkable bilaterally. There is no truncal or gait ataxia.  Sensory exam: intact to light touch in the upper and lower extremities.  Gait, station and balance: He stands easily. No veering to one side is noted. No leaning to one side is noted. Posture is age-appropriate and stance is narrow based. Gait shows normal stride length and normal pace. No problems turning are noted.   Assessment and Plan:   In summary, Nahmir Zeidman is a very pleasant 68 y.o.-year old male with an underlying medical history of liver cirrhosis, s/p Liver Tx at [facility] in 2016, hypertension, hyperlipidemia, coronary artery disease, s/p 4 vessel CABG, BPH, recent shingles and obesity, who presents for evaluation of his sleep disordered breathing. A home sleep test in April 2019 indicated severe sleep apnea and he reports intolerance to AutoPap therapy at the time. I would recommend reevaluation especially in light of his history of heart disease, and in light of his weight loss. A laboratory attended studies indicated in his case. I had a long chat with  the patient about my findings and the diagnosis of OSA, its prognosis and treatment options. We talked about medical treatments, surgical interventions and non-pharmacological approaches. I explained in particular the risks and ramifications of untreated moderate to severe OSA, especially with respect to developing cardiovascular disease down the Road, including congestive heart  failure, difficult to treat hypertension, cardiac arrhythmias, or stroke. Even type 2 diabetes has, in part, been linked to untreated OSA. Symptoms of untreated OSA include daytime sleepiness, memory problems, mood irritability and mood disorder such as depression and anxiety, lack of energy, as well as recurrent headaches, especially morning headaches. We talked about trying to maintain a healthy lifestyle in general, as well as the importance of weight control. I encouraged the patient to eat healthy, exercise daily and keep well hydrated, to keep a scheduled bedtime and wake time routine, to not skip any meals and eat healthy snacks in between meals. I advised the patient not to drive when feeling sleepy. I recommended the following at this time: sleep study with potential positive airway pressure titration. (We will score hypopneas at 4%).   I explained the sleep test procedure to the patient and also outlined possible surgical and non-surgical treatment options of OSA, including the use of a custom-made dental device (which would require a referral to a specialist dentist or oral surgeon), upper airway surgical options, such as pillar implants, radiofrequency surgery, tongue base surgery, and UPPP (which would involve a referral to an ENT surgeon) and the [facility] implantable device. Rarely, jaw surgery such as mandibular advancement may be considered.  I also explained the CPAP treatment option to the patient, who indicated that he would be willing to try CPAP if the need arises. I explained the importance of being compliant with  PAP treatment, not only for insurance purposes but primarily to improve His symptoms, and for the patient's long term health benefit, including to reduce His cardiovascular risks. I answered all his questions today and the patient was in agreement. I plan to see him back after the sleep study is completed and encouraged him to call with any interim questions, concerns, problems or updates.   Thank you very much for allowing me to participate in the care of this nice patient. If I can be of any further assistance to you please do not hesitate to call me at [phone].  Sincerely,   Star Age, MD, PhD

## 2018-09-27 NOTE — Patient Instructions (Signed)
Thank you for choosing [facility] for your sleep related care! It was nice to meet you today! I appreciate that you entrust me with your sleep related healthcare concerns. I hope, I was able to address at least some of your concerns today, and that I can help you feel reassured and also get better.    Here is what we discussed today and what we came up with as our plan for you:    Based on your symptoms and your exam I believe you are still at risk for obstructive sleep apnea and would benefit from reevaluation with an attended sleep study.  Therefore, I think we should proceed with a sleep study to determine how severe your sleep apnea is. If you have more than mild OSA, I want you to consider ongoing treatment with CPAP. Please remember, the risks and ramifications of moderate to severe obstructive sleep apnea or OSA are: Cardiovascular disease, including congestive heart failure, stroke, difficult to control hypertension, arrhythmias, and even type 2 diabetes has been linked to untreated OSA. Sleep apnea causes disruption of sleep and sleep deprivation in most cases, which, in turn, can cause recurrent headaches, problems with memory, mood, concentration, focus, and vigilance. Most people with untreated sleep apnea report excessive daytime sleepiness, which can affect their ability to drive. Please do not drive if you feel sleepy.   I will likely see you back after your sleep study to go over the test results and where to go from there. We will call you after your sleep study to advise about the results (most likely, you will hear from [place], my nurse) and to set up an appointment at the time, as necessary.    Our sleep lab administrative assistant will call you to schedule your sleep study. If you don't hear back from her by about 2 weeks from now, please feel free to call her at [phone]. You can leave a message with your phone number and concerns, if you get the voicemail  box. She will call back as soon as possible.

## 2018-09-28 DIAGNOSIS — C220 Liver cell carcinoma: Secondary | ICD-10-CM | POA: Diagnosis not present

## 2018-09-28 DIAGNOSIS — D899 Disorder involving the immune mechanism, unspecified: Secondary | ICD-10-CM | POA: Diagnosis not present

## 2018-09-28 DIAGNOSIS — Z944 Liver transplant status: Secondary | ICD-10-CM | POA: Diagnosis not present

## 2018-09-28 DIAGNOSIS — K7030 Alcoholic cirrhosis of liver without ascites: Secondary | ICD-10-CM | POA: Diagnosis not present

## 2018-10-21 DIAGNOSIS — H0102B Squamous blepharitis left eye, upper and lower eyelids: Secondary | ICD-10-CM | POA: Diagnosis not present

## 2018-10-21 DIAGNOSIS — H2513 Age-related nuclear cataract, bilateral: Secondary | ICD-10-CM | POA: Diagnosis not present

## 2018-10-21 DIAGNOSIS — H0102A Squamous blepharitis right eye, upper and lower eyelids: Secondary | ICD-10-CM | POA: Diagnosis not present

## 2018-10-21 DIAGNOSIS — B0239 Other herpes zoster eye disease: Secondary | ICD-10-CM | POA: Diagnosis not present

## 2018-11-01 ENCOUNTER — Other Ambulatory Visit: Payer: Self-pay

## 2018-11-01 ENCOUNTER — Telehealth: Payer: Self-pay | Admitting: Family Medicine

## 2018-11-01 ENCOUNTER — Telehealth: Payer: Self-pay

## 2018-11-01 DIAGNOSIS — G4733 Obstructive sleep apnea (adult) (pediatric): Secondary | ICD-10-CM

## 2018-11-01 NOTE — Telephone Encounter (Signed)
Copied from [place] [phone]. Topic: Referral - Request for Referral >> Nov 01, 2018 10:11 AM Carolyn Stare wrote:  Pt cal lto say he would like to be referred to someone else for a sleep study, he has been trying to reach [facility] but phone is saying no longer in service.

## 2018-11-01 NOTE — Telephone Encounter (Signed)
Pt has cancelled sleep study with [name] for tonight. Pt stated he doesn't feel comfortable performing sleep study. Pt stated he was unable to reach [name]. Tried explaining to pt that our phones lines have been cut and we're trying to get them fixed asap.

## 2018-11-01 NOTE — Telephone Encounter (Signed)
Referral has been placed. 

## 2018-11-04 DIAGNOSIS — H0102A Squamous blepharitis right eye, upper and lower eyelids: Secondary | ICD-10-CM | POA: Diagnosis not present

## 2018-11-04 DIAGNOSIS — H52223 Regular astigmatism, bilateral: Secondary | ICD-10-CM | POA: Diagnosis not present

## 2018-11-04 DIAGNOSIS — H25813 Combined forms of age-related cataract, bilateral: Secondary | ICD-10-CM | POA: Diagnosis not present

## 2018-11-04 DIAGNOSIS — H40013 Open angle with borderline findings, low risk, bilateral: Secondary | ICD-10-CM | POA: Diagnosis not present

## 2018-11-04 DIAGNOSIS — H0102B Squamous blepharitis left eye, upper and lower eyelids: Secondary | ICD-10-CM | POA: Diagnosis not present

## 2018-11-04 DIAGNOSIS — H524 Presbyopia: Secondary | ICD-10-CM | POA: Diagnosis not present

## 2018-11-04 DIAGNOSIS — H5203 Hypermetropia, bilateral: Secondary | ICD-10-CM | POA: Diagnosis not present

## 2018-11-04 DIAGNOSIS — B0239 Other herpes zoster eye disease: Secondary | ICD-10-CM | POA: Diagnosis not present

## 2018-11-08 ENCOUNTER — Telehealth: Payer: Self-pay | Admitting: Family Medicine

## 2018-11-08 NOTE — Telephone Encounter (Signed)
Rx request. Please advise

## 2018-11-08 NOTE — Telephone Encounter (Signed)
Ok to send in 1000mg  tid x 7 days.  He needs to see ophtho again if it is in the same area as last time.  Algis Greenhouse. Jerline Pain, MD 11/08/2018 5:27 PM

## 2018-11-08 NOTE — Telephone Encounter (Signed)
See note

## 2018-11-08 NOTE — Telephone Encounter (Signed)
Copied from [place] [id number]. Topic: Quick Communication - See Telephone Encounter >> Nov 08, 2018  4:13 PM Blase Mess A wrote: CRM for notification. See Telephone encounter for: 11/08/18.  Patient is calling because he is thinking that he is having another flair up of shingles. Requesting if Dr. Jerline Pain can call in the same thing that he called in previously for him please. Thank you. CB [phone] [id number] [place], [place] [phone] (Phone) [phone] (Fax)

## 2018-11-09 ENCOUNTER — Other Ambulatory Visit: Payer: Self-pay

## 2018-11-09 MED ORDER — VALACYCLOVIR HCL 1 G PO TABS: 1000.0000 mg | ORAL_TABLET | Freq: Three times a day (TID) | ORAL | 0 refills | Status: AC

## 2018-11-09 NOTE — Telephone Encounter (Signed)
Rx sent to pharmacy   

## 2018-12-14 ENCOUNTER — Encounter: Payer: Self-pay | Admitting: Family Medicine

## 2018-12-14 NOTE — Patient Instructions (Signed)
Health Maintenance Due  Topic Date Due  . Hepatitis C Screening  1950-09-18    Depression screen PHQ 2/9 07/16/2018  Decreased Interest 0  Down, Depressed, Hopeless 0  PHQ - 2 Score 0

## 2018-12-15 ENCOUNTER — Encounter: Payer: Self-pay | Admitting: Family Medicine

## 2018-12-15 ENCOUNTER — Ambulatory Visit (INDEPENDENT_AMBULATORY_CARE_PROVIDER_SITE_OTHER): Payer: Medicare Other | Admitting: Family Medicine

## 2018-12-15 VITALS — BP 132/76 | HR 62 | Temp 97.5°F | Ht 72.0 in | Wt 242.6 lb

## 2018-12-15 DIAGNOSIS — N401 Enlarged prostate with lower urinary tract symptoms: Secondary | ICD-10-CM | POA: Diagnosis not present

## 2018-12-15 DIAGNOSIS — I2510 Atherosclerotic heart disease of native coronary artery without angina pectoris: Secondary | ICD-10-CM | POA: Diagnosis not present

## 2018-12-15 DIAGNOSIS — G4733 Obstructive sleep apnea (adult) (pediatric): Secondary | ICD-10-CM | POA: Diagnosis not present

## 2018-12-15 DIAGNOSIS — N3943 Post-void dribbling: Secondary | ICD-10-CM

## 2018-12-15 DIAGNOSIS — N529 Male erectile dysfunction, unspecified: Secondary | ICD-10-CM

## 2018-12-15 DIAGNOSIS — L578 Other skin changes due to chronic exposure to nonionizing radiation: Secondary | ICD-10-CM | POA: Diagnosis not present

## 2018-12-15 DIAGNOSIS — R51 Headache: Secondary | ICD-10-CM | POA: Diagnosis not present

## 2018-12-15 DIAGNOSIS — R519 Headache, unspecified: Secondary | ICD-10-CM

## 2018-12-15 MED ORDER — TADALAFIL 10 MG PO TABS: 10.0000 mg | ORAL_TABLET | Freq: Every day | ORAL | 5 refills | Status: DC | PRN

## 2018-12-15 NOTE — Assessment & Plan Note (Signed)
Stable.  Continue management per cardiology.  Has an appointment in a few months.

## 2018-12-15 NOTE — Assessment & Plan Note (Signed)
Stable.  Continue Topamax 25 mg daily.

## 2018-12-15 NOTE — Assessment & Plan Note (Signed)
Will refer for sleep study

## 2018-12-15 NOTE — Assessment & Plan Note (Signed)
Increase Cialis to 10 mg daily for worsening symptoms.  Consider trial of Flomax or further escalating dose of symptoms not improved with this.

## 2018-12-15 NOTE — Assessment & Plan Note (Signed)
We will increase Cialis to 10 mg daily for BPH with LUTS.

## 2018-12-15 NOTE — Progress Notes (Signed)
    Chief Complaint:  Brandon [name] is a 69 y.o. male who presents today for a virtual office visit with a chief complaint of BPH.   Assessment/Plan:  OSA (obstructive sleep apnea) Will refer for sleep study.  Erectile dysfunction We will increase Cialis to 10 mg daily for BPH with LUTS.  BPH Increase Cialis to 10 mg daily for worsening symptoms.  Consider trial of Flomax or further escalating dose of symptoms not improved with this.  CAD s/p CABG Stable.  Continue management per cardiology.  Has an appointment in a few months.  Frequent headaches Stable.  Continue Topamax 25 mg daily.  [facility] referral to dermatology.    Subjective:  HPI:  His  chronic medical conditions are outlined below:   # BPH / ED -On Cialis 5 mg daily.  Tolerating well. -Has noticed worsening nocturia for the past couple of weeks.  Also has noticed worsening weak stream. -No dysuria.  No hematuria.  No abdominal pain.  # OSA  -Was previously referred to sleep medicine however has not yet seen them.  Request another referral today.  # Frequent Headaches -On Topamax 25 mg daily.  Tolerating well.  % CAD s/p CABG -Follows with cardiology -On aspirin 81 mg daily.  Not on statin due to history of liver transplant.  ROS: Per HPI  PMH: He reports that he has quit smoking. He has never used smokeless tobacco. He reports that he does not use drugs. No history on file for alcohol.      Objective/Observations  Physical Exam: BP 132/76   Pulse 62   Temp (!) 97.5 F (36.4 C)   Ht 6' (1.829 m)   Wt 242 lb 9.6 oz (110 kg)   BMI 32.90 kg/m   Gen: NAD, resting comfortably Pulm: Normal work of breathing Neuro: Grossly normal, moves all extremities Psych: Normal affect and thought content  Virtual Visit via Video   I connected with Brandon [name] on 12/15/18 at  8:00 AM EDT by a video enabled telemedicine application and verified that I am speaking with the correct person using  two identifiers. I discussed the limitations of evaluation and management by telemedicine and the availability of in person appointments. The patient expressed understanding and agreed to proceed.   Patient location: Home Provider location: [place] participating in the virtual visit: Myself and patient     Algis Greenhouse. Jerline Pain, MD 12/15/2018 8:57 AM

## 2019-01-13 ENCOUNTER — Ambulatory Visit (INDEPENDENT_AMBULATORY_CARE_PROVIDER_SITE_OTHER): Payer: Medicare Other | Admitting: Internal Medicine

## 2019-01-13 ENCOUNTER — Other Ambulatory Visit: Payer: Self-pay

## 2019-01-13 ENCOUNTER — Encounter: Payer: Self-pay | Admitting: Internal Medicine

## 2019-01-13 VITALS — BP 118/60 | HR 74 | Ht 72.0 in | Wt 244.2 lb

## 2019-01-13 DIAGNOSIS — G4733 Obstructive sleep apnea (adult) (pediatric): Secondary | ICD-10-CM | POA: Diagnosis not present

## 2019-01-13 DIAGNOSIS — Z944 Liver transplant status: Secondary | ICD-10-CM | POA: Diagnosis not present

## 2019-01-13 DIAGNOSIS — I2510 Atherosclerotic heart disease of native coronary artery without angina pectoris: Secondary | ICD-10-CM

## 2019-01-13 NOTE — Patient Instructions (Signed)
If you can find it, please get [name] a copy of your original diagnostic sleep study and we will see if we can come up with a treatment approach that works for you.   Please call as needed

## 2019-01-13 NOTE — Progress Notes (Signed)
01/13/2019- 89 yoM former smoker for sleep evaluation referred by Dr. Dimas Chyle (PCP), states it's his second referral to a pulmonologist, had sleep test in [place], [place], meant to bring it but couldn't find it; approved for pulsed machine, but chose not to use, not currently on CPAP, could get comfortable with it Medical problem list includes CAD/CABG, OSA, BPH, Liver Transplant 2017, headaches,  Body weight today 244 lbs Epworth score 11 HST ([place], [place])12/10/2017 - AHI 58/ hr, desaturation to 74%, body weight 269 lbs. He admits loud snoring, daytime sleepiness if not stimulated. Couldn't tolerate brief trial of CPAP in [place] because mask uncomfortable. Denies parasomnias.  Discussion had begun in [place] about possible INSPIRE surgery, before he moved here.  ENT surgery+ tonsils.  Liver transplant now 3 years ago ( cirrhosis and cancer), CAD/ CABG [place] in the fall of 2019 and broke 5 R ribs. 5 of 7 siblings on CPAP.  Prior to Admission medications   Medication Sig Start Date End Date Taking? Authorizing Provider  aspirin EC 81 MG tablet Take 81 mg by mouth daily.   Yes [provider]  calcium gluconate 500 MG tablet Take 1 tablet by mouth 3 (three) times daily.   Yes [provider]  Everolimus (ZORTRESS) 0.25 MG TABS Take 0.75 mg by mouth 2 (two) times daily.   Yes [provider]  furosemide (LASIX) 40 MG tablet Take by mouth. 11/09/14  Yes [provider]  Multiple Vitamin (MULTIVITAMIN WITH MINERALS) TABS tablet Take 1 tablet by mouth daily.   Yes [provider]  Mycophenolate Sodium (MYCOPHENOLIC ACID PO) Take 009 mg by mouth 2 (two) times daily.   Yes [provider]  tadalafil (CIALIS) 10 MG tablet Take 1 tablet (10 mg total) by mouth daily as needed for erectile dysfunction. 12/15/18  Yes [name], MD  triamcinolone ointment (KENALOG) 0.5 % Apply 1 application topically 2 (two) times daily. 09/10/18  Yes [name], MD   valGANciclovir HCl (VALCYTE PO) Take by mouth.   Yes [provider]  topiramate (TOPAMAX) 25 MG tablet Take by mouth. 09/16/17   [provider]   Past Medical History:  Diagnosis Date  . BPH (benign prostatic hyperplasia)   . Coronary artery disease   . Hyperlipidemia   . Hypertension    Past Surgical History:  Procedure Laterality Date  . CORONARY ARTERY BYPASS GRAFT    . LIVER TRANSPLANT     Family History  Problem Relation Age of Onset  . Heart disease Sister   . Heart disease Brother   . Prostate cancer Brother    Social History   Socioeconomic History  . Marital status: Married    Spouse name: Not on file  . Number of children: Not on file  . Years of education: Not on file  . Highest education level: Not on file  Occupational History  . Not on file  Social Needs  . Financial resource strain: Not on file  . Food insecurity:    Worry: Not on file    Inability: Not on file  . Transportation needs:    Medical: Not on file    Non-medical: Not on file  Tobacco Use  . Smoking status: Former Smoker    Packs/day: 1.00    Types: Cigarettes    Last attempt to quit: 01/12/1998    Years since quitting: 21.0  . Smokeless tobacco: Never Used  Substance and Sexual Activity  . Alcohol use: Not on file  .  Drug use: Never  . Sexual activity: Yes  Lifestyle  . Physical activity:    Days per week: Not on file    Minutes per session: Not on file  . Stress: Not on file  Relationships  . Social connections:    Talks on phone: Not on file    Gets together: Not on file    Attends religious service: Not on file    Active member of club or organization: Not on file    Attends meetings of clubs or organizations: Not on file    Relationship status: Not on file  . Intimate partner violence:    Fear of current or ex partner: Not on file    Emotionally abused: Not on file    Physically abused: Not on file    Forced sexual activity: Not on file  Other Topics  Concern  . Not on file  Social History Narrative  . Not on file   ROS-see HPI   + = positive Constitutional:    +weight loss, night sweats, fevers, chills, +fatigue, lassitude. HEENT:    headaches, difficulty swallowing, tooth/dental problems, sore throat,       sneezing, itching, ear ache, nasal congestion, post nasal drip, snoring CV:    chest pain, orthopnea, PND, swelling in lower extremities, anasarca,                                  dizziness, palpitations Resp:   shortness of breath with exertion or at rest.                productive cough,   non-productive cough, coughing up of blood.              change in color of mucus.  wheezing.   Skin:    rash or lesions. GI:  No-   heartburn, indigestion, abdominal pain, nausea, vomiting, diarrhea,                 change in bowel habits, loss of appetite GU: dysuria, change in color of urine, no urgency or frequency.   flank pain. MS:   joint pain, stiffness, decreased range of motion, back pain. Neuro-     nothing unusual Psych:  change in mood or affect.  depression or anxiety.   memory loss.  OBJ- Physical Exam General- Alert, Oriented, Affect-appropriate, Distress- none acute, + overweight Skin- rash-none, lesions- none, excoriation- none Lymphadenopathy- none Head- atraumatic            Eyes- Gross vision intact, PERRLA, conjunctivae and secretions clear            Ears- Hearing, canals-normal            Nose- Clear, no-Septal dev, mucus, polyps, erosion, perforation             Throat- Mallampati II-III , mucosa clear , drainage- none, tonsils- atrophic Neck- flexible , trachea midline, no stridor , thyroid nl, carotid no bruit Chest - symmetrical excursion , unlabored           Heart/CV- RRR , no murmur , no gallop  , no rub, nl s1 s2                           - JVD- none , edema- none, stasis changes- none, varices- none           Lung- clear  to P&A, wheeze- none, cough- none , dullness-none, rub- none           Chest wall-   Abd-  Br/ Gen/ Rectal- Not done, not indicated Extrem- cyanosis- none, clubbing, none, atrophy- none, strength- nl Neuro- grossly intact to observation

## 2019-01-25 NOTE — Assessment & Plan Note (Signed)
He says this is stable, since transplant for cirrhosis and cancer in 2017.

## 2019-01-25 NOTE — Assessment & Plan Note (Signed)
He has dieted off about 25 lbs since his initial sleep study in 2019, in [place]. He didn't tolerate CPAP auto 4-20 there, but it may have been a mask fit issue.He knows some about Inspire surgery, but before we refer him there, I think he should have another chance to try CPAP with an effort at good mask fit.  Plan- referral to new DME for new CPAP auto 5-20, mask of choice

## 2019-01-26 ENCOUNTER — Telehealth: Payer: Self-pay | Admitting: Internal Medicine

## 2019-01-26 DIAGNOSIS — G4733 Obstructive sleep apnea (adult) (pediatric): Secondary | ICD-10-CM

## 2019-01-26 NOTE — Telephone Encounter (Signed)
Called & spoke w/ patient in regards to CPAP recommendations per CY. Pt last seen 01/13/2019 for sleep consultation and stated he would provide [name] with a copy of his diagnostic sleep study from his previous sleep doctor in [place]. At the time of this appt, I had pt agree to and sign a release of information form to receive the study directly from [facility] in [place]. We received his sleep study via fax, CY reviewed it, and made the recommendations of new DME, new CPAP auto 5-20, mask of choice, humidifier, supplies, AirView/card.   I informed pt over the phone of the above information. Pt verbalized understanding and agreed to these measures. I have placed an order for new DME/CPAP start per CY. Will keep pt's sleep study handy for [facility] review and to be put to scan. Nothing further needed at this time.

## 2019-02-14 ENCOUNTER — Other Ambulatory Visit: Payer: Self-pay

## 2019-02-14 ENCOUNTER — Telehealth: Payer: Self-pay

## 2019-02-14 ENCOUNTER — Telehealth: Payer: Self-pay | Admitting: Internal Medicine

## 2019-02-14 DIAGNOSIS — Z944 Liver transplant status: Secondary | ICD-10-CM

## 2019-02-14 DIAGNOSIS — D849 Immunodeficiency, unspecified: Secondary | ICD-10-CM

## 2019-02-14 NOTE — Telephone Encounter (Signed)
LMTCB

## 2019-02-14 NOTE — Telephone Encounter (Signed)
There was a message sent here on 01/26/19 to [facility] that pt was non compliant on cpap before and will need a new cpap I was told that the [email or web address] told the pt this also Joellen Jersey

## 2019-02-14 NOTE — Telephone Encounter (Signed)
Left VM  with patient in regards to coming in to have lab work drawn. When lab work is resulted will need to be faxed to [place]

## 2019-02-14 NOTE — Telephone Encounter (Signed)
Spoke to [email or web address] she is checkin on this order Joellen [place]

## 2019-02-14 NOTE — Telephone Encounter (Signed)
5/20 Emily placed order for new cpap and had copy of last sleep study faxed over. Not sure what else is needed. Pt moved here and needs new cpap machine.

## 2019-02-14 NOTE — Telephone Encounter (Signed)
Sent a message to [facility] to get an update on the order that was faxed on 01/26/2019.  Will give update once I have received a reply.

## 2019-02-14 NOTE — Telephone Encounter (Signed)
Order placed 5/20 for cpap. Pt checking status as he has not heard from [facility].

## 2019-02-15 NOTE — Telephone Encounter (Signed)
Alen Bleacher sent to [name]; [facility] HAS THE PATIENT INFORMATION TO APPROVE. ACCORDING TO PT CHART NOTES, HE HAS BEEN CONTACTED AND KNOWS WHAT WE ARE WAITING ON TO BE ABLE TO SCHEDULE HIM FOR A CPAP SET UP.   [place]

## 2019-02-16 ENCOUNTER — Telehealth: Payer: Self-pay | Admitting: Internal Medicine

## 2019-02-16 DIAGNOSIS — G4733 Obstructive sleep apnea (adult) (pediatric): Secondary | ICD-10-CM

## 2019-02-16 NOTE — Telephone Encounter (Signed)
Dr. Annamaria Boots, please advise if you are okay with [name] ordering a new sleep study for pt per [facility]? Thanks!

## 2019-02-16 NOTE — Telephone Encounter (Signed)
Ok to schedule HST, or in-center [facility] if insurance requires in-center,    Dx OSA Need to note that we copy results to his dME so they can proceed with PAP order.

## 2019-02-16 NOTE — Telephone Encounter (Signed)
HST ordered. I called pt to let him know and nothing further is needed.

## 2019-02-16 NOTE — Telephone Encounter (Signed)
Pt states [facility] contacted him and notified sleep documents not acceptable. [facility] can you please verify if new sleep study is needed?

## 2019-02-16 NOTE — Telephone Encounter (Signed)
It looks like pt will need to complete a new sleep study per [facility].  Will you see if CY will order the sleep study?

## 2019-02-17 ENCOUNTER — Other Ambulatory Visit: Payer: Self-pay

## 2019-02-17 ENCOUNTER — Other Ambulatory Visit (INDEPENDENT_AMBULATORY_CARE_PROVIDER_SITE_OTHER): Payer: Medicare Other

## 2019-02-17 DIAGNOSIS — Z944 Liver transplant status: Secondary | ICD-10-CM

## 2019-02-17 DIAGNOSIS — D899 Disorder involving the immune mechanism, unspecified: Secondary | ICD-10-CM | POA: Diagnosis not present

## 2019-02-17 DIAGNOSIS — Z13220 Encounter for screening for lipoid disorders: Secondary | ICD-10-CM

## 2019-02-17 DIAGNOSIS — D849 Immunodeficiency, unspecified: Secondary | ICD-10-CM

## 2019-02-17 LAB — CBC
HCT: 42.8 % (ref 39.0–52.0)
Hemoglobin: 14.6 g/dL (ref 13.0–17.0)
MCHC: 34 g/dL (ref 30.0–36.0)
MCV: 87.5 fl (ref 78.0–100.0)
Platelets: 201 10*3/uL (ref 150.0–400.0)
RBC: 4.89 Mil/uL (ref 4.22–5.81)
RDW: 13.8 % (ref 11.5–15.5)
WBC: 5.4 10*3/uL (ref 4.0–10.5)

## 2019-02-17 LAB — COMPREHENSIVE METABOLIC PANEL
ALT: 25 U/L (ref 0–53)
AST: 21 U/L (ref 0–37)
Albumin: 4.1 g/dL (ref 3.5–5.2)
Alkaline Phosphatase: 79 U/L (ref 39–117)
BUN: 16 mg/dL (ref 6–23)
CO2: 27 mEq/L (ref 19–32)
Calcium: 9.3 mg/dL (ref 8.4–10.5)
Chloride: 105 mEq/L (ref 96–112)
Creatinine, Ser: 1.13 mg/dL (ref 0.40–1.50)
GFR: 64.71 mL/min (ref >60.00)
Glucose, Bld: 100 mg/dL — ABNORMAL HIGH (ref 70–99)
Potassium: 4.5 mEq/L (ref 3.5–5.1)
Sodium: 139 mEq/L (ref 135–145)
Total Bilirubin: 0.6 mg/dL (ref 0.2–1.2)
Total Protein: 6.4 g/dL (ref 6.0–8.3)

## 2019-02-17 LAB — LIPID PANEL
Cholesterol: 246 mg/dL — ABNORMAL HIGH (ref 0–200)
HDL: 38.9 mg/dL — ABNORMAL LOW (ref >39.00)
LDL Cholesterol: 175 mg/dL — ABNORMAL HIGH (ref 0–99)
NonHDL: 207.01
Total CHOL/HDL Ratio: 6
Triglycerides: 158 mg/dL — ABNORMAL HIGH (ref 0.0–149.0)
VLDL: 31.6 mg/dL (ref 0.0–40.0)

## 2019-02-17 LAB — BASIC METABOLIC PANEL
BUN: 16 (ref 4–21)
Creatinine: 1.1 (ref 0.6–1.3)
Glucose: 100
Sodium: 139 (ref 137–147)

## 2019-02-17 LAB — HEPATIC FUNCTION PANEL: Bilirubin, Total: 0.6

## 2019-02-17 LAB — CBC AND DIFFERENTIAL: WBC: 5.4

## 2019-02-17 NOTE — Addendum Note (Signed)
Addended by: Francis Dowse T on: 02/17/2019 02:41 PM   Modules accepted: Orders

## 2019-02-17 NOTE — Addendum Note (Signed)
Addended by: Francis Dowse T on: 02/17/2019 08:48 AM   Modules accepted: Orders

## 2019-02-20 LAB — EVEROLIMUS: Everolimus: 3.9 ng/mL (ref 3.0–8.0)

## 2019-02-21 NOTE — Progress Notes (Signed)
Please inform patient of the following:  Cholesterol levels are high but everything else looks good. His results should be available on mychart so his doctors at [facility] can also see them.

## 2019-02-24 ENCOUNTER — Other Ambulatory Visit: Payer: Self-pay

## 2019-02-24 DIAGNOSIS — I2510 Atherosclerotic heart disease of native coronary artery without angina pectoris: Secondary | ICD-10-CM

## 2019-02-24 DIAGNOSIS — H9190 Unspecified hearing loss, unspecified ear: Secondary | ICD-10-CM

## 2019-02-28 ENCOUNTER — Encounter: Payer: Self-pay | Admitting: Family Medicine

## 2019-02-28 ENCOUNTER — Other Ambulatory Visit: Payer: Self-pay

## 2019-02-28 MED ORDER — VALACYCLOVIR HCL 1 G PO TABS: 1000.0000 mg | ORAL_TABLET | Freq: Three times a day (TID) | ORAL | 0 refills | Status: AC

## 2019-03-05 ENCOUNTER — Encounter: Payer: Self-pay | Admitting: Family Medicine

## 2019-03-07 ENCOUNTER — Other Ambulatory Visit: Payer: Self-pay

## 2019-03-07 ENCOUNTER — Other Ambulatory Visit: Payer: Self-pay | Admitting: Physical Therapy

## 2019-03-07 DIAGNOSIS — L089 Local infection of the skin and subcutaneous tissue, unspecified: Secondary | ICD-10-CM | POA: Diagnosis not present

## 2019-03-07 DIAGNOSIS — L080 Pyoderma: Secondary | ICD-10-CM | POA: Diagnosis not present

## 2019-03-07 DIAGNOSIS — B029 Zoster without complications: Secondary | ICD-10-CM | POA: Diagnosis not present

## 2019-03-07 DIAGNOSIS — L0291 Cutaneous abscess, unspecified: Secondary | ICD-10-CM | POA: Diagnosis not present

## 2019-03-07 DIAGNOSIS — D229 Melanocytic nevi, unspecified: Secondary | ICD-10-CM | POA: Diagnosis not present

## 2019-03-07 DIAGNOSIS — Z944 Liver transplant status: Secondary | ICD-10-CM

## 2019-03-07 DIAGNOSIS — L570 Actinic keratosis: Secondary | ICD-10-CM | POA: Diagnosis not present

## 2019-03-07 DIAGNOSIS — D849 Immunodeficiency, unspecified: Secondary | ICD-10-CM

## 2019-03-08 NOTE — Telephone Encounter (Signed)
Rodena Piety has signed out pt's sleep study.  I made her aware pt has sent in MyChart message.

## 2019-03-08 NOTE — Telephone Encounter (Signed)
Patient was wondering when they will have the HST.  Will forward to pcc pool for update.

## 2019-03-08 NOTE — Telephone Encounter (Signed)
Brandon [name] has pt scheduled for hst for tomorrow.  Will send back to triage to close out note.

## 2019-03-08 NOTE — Telephone Encounter (Signed)
Sent MyChart message to pt reminding him of his HST tomorrow 03/09/2019 at 3:30 PM with Rodena Piety. Nothing further needed at this time.

## 2019-03-09 ENCOUNTER — Ambulatory Visit: Payer: Medicare Other

## 2019-03-09 ENCOUNTER — Other Ambulatory Visit: Payer: Self-pay

## 2019-03-09 ENCOUNTER — Encounter: Payer: Self-pay | Admitting: Family Medicine

## 2019-03-09 DIAGNOSIS — G4733 Obstructive sleep apnea (adult) (pediatric): Secondary | ICD-10-CM

## 2019-03-15 DIAGNOSIS — G4733 Obstructive sleep apnea (adult) (pediatric): Secondary | ICD-10-CM

## 2019-03-24 ENCOUNTER — Telehealth: Payer: Self-pay | Admitting: Internal Medicine

## 2019-03-24 ENCOUNTER — Institutional Professional Consult (permissible substitution): Payer: Medicare Other | Admitting: Internal Medicine

## 2019-03-24 DIAGNOSIS — G4733 Obstructive sleep apnea (adult) (pediatric): Secondary | ICD-10-CM

## 2019-03-24 DIAGNOSIS — H93293 Other abnormal auditory perceptions, bilateral: Secondary | ICD-10-CM | POA: Diagnosis not present

## 2019-03-24 NOTE — Telephone Encounter (Signed)
Patient called back. I let him know we were waiting for the results and we could call him as soon as we had them. Patient voiced understanding.

## 2019-03-24 NOTE — Telephone Encounter (Signed)
ATC patient unable to reach, doesn't look like it has been resulted yet.  Will route message to Dr. Ninetta Lights patient calls back.  Dr. Annamaria Boots please advise on sleep study results

## 2019-03-25 NOTE — Telephone Encounter (Signed)
Pt aware of results of HST. Pt is willing to try cpap therapy again as he is not in favor of surgery. Order placed and f/u appt scheduled 11/5 Pt made aware he needed to be seen before 90th day on cpap and that is CY 1st available. Pt did not want to see a NP as I offered appt with TP, pt declined. Nothing further needed.

## 2019-03-25 NOTE — Telephone Encounter (Signed)
LMTCB x 1 

## 2019-03-25 NOTE — Telephone Encounter (Signed)
Pt is calling back [phone]

## 2019-03-25 NOTE — Telephone Encounter (Signed)
His home sleep test showed severe obstructive sleep apnea, averaging over 43 apneas/ hour, with drops in blood oxygen level. I know he didn't do well with CPAP before, and he has some interest in Inspire surgery. I would suggest he let us retry CPAP with better mask fit.        If he agrees,  Order new DME, new CPAP auto 5-20, mask of choice, humidifier, supplies, airView/ card,               And return OV in 31-90 days  If he would prefer to be referred now to look into Inspire surgery, then:             Order  referral to ENT Dr Redmond Baseman  To consider Inspire surgery for OSA

## 2019-04-04 ENCOUNTER — Encounter: Payer: Self-pay | Admitting: Family Medicine

## 2019-04-05 ENCOUNTER — Other Ambulatory Visit: Payer: Self-pay

## 2019-04-06 ENCOUNTER — Ambulatory Visit (INDEPENDENT_AMBULATORY_CARE_PROVIDER_SITE_OTHER): Payer: Medicare Other | Admitting: Endocrinology

## 2019-04-06 ENCOUNTER — Encounter: Payer: Self-pay | Admitting: Endocrinology

## 2019-04-06 DIAGNOSIS — I2510 Atherosclerotic heart disease of native coronary artery without angina pectoris: Secondary | ICD-10-CM

## 2019-04-06 DIAGNOSIS — E785 Hyperlipidemia, unspecified: Secondary | ICD-10-CM | POA: Diagnosis not present

## 2019-04-06 MED ORDER — ROSUVASTATIN CALCIUM 5 MG PO TABS: 5.0000 mg | ORAL_TABLET | Freq: Every day | ORAL | 3 refills | Status: DC

## 2019-04-06 NOTE — Progress Notes (Signed)
Subjective:    Patient ID: Brandon [name], male    DOB: 1951/02/23, 68 y.o.   MRN: [id number]  HPI Pt is referred for Dr Jerline Pain, dyslipidemia.  This was dx'ed in 2010.  He had liver transplant in 8185, for alcoholic cirrhosis.  He denies h/o pancreatitis, diabetes, steroids, HIV, SLE, HCTZ, retin-A, B-blocker, thyroid problems, or kidney disease.  He had CABG in 2009.  He has not recently consumed alcohol.  He has slight intermitt headache throughout the head, and assoc urinary frequency.  Pt says he has never been on lipid meds.   Past Medical History:  Diagnosis Date  . BPH (benign prostatic hyperplasia)   . Coronary artery disease   . Hyperlipidemia   . Hypertension     Past Surgical History:  Procedure Laterality Date  . CORONARY ARTERY BYPASS GRAFT    . LIVER TRANSPLANT      Social History   Socioeconomic History  . Marital status: Married    Spouse name: Not on file  . Number of children: Not on file  . Years of education: Not on file  . Highest education level: Not on file  Occupational History  . Not on file  Social Needs  . Financial resource strain: Not on file  . Food insecurity    Worry: Not on file    Inability: Not on file  . Transportation needs    Medical: Not on file    Non-medical: Not on file  Tobacco Use  . Smoking status: Former Smoker    Packs/day: 1.00    Types: Cigarettes    Quit date: 01/12/1998    Years since quitting: 21.2  . Smokeless tobacco: Never Used  Substance and Sexual Activity  . Alcohol use: Not on file  . Drug use: Never  . Sexual activity: Yes  Lifestyle  . Physical activity    Days per week: Not on file    Minutes per session: Not on file  . Stress: Not on file  Relationships  . Social Herbalist on phone: Not on file    Gets together: Not on file    Attends religious service: Not on file    Active member of club or organization: Not on file    Attends meetings of clubs or organizations: Not on file     Relationship status: Not on file  . Intimate partner violence    Fear of current or ex partner: Not on file    Emotionally abused: Not on file    Physically abused: Not on file    Forced sexual activity: Not on file  Other Topics Concern  . Not on file  Social History Narrative  . Not on file    Current Outpatient Medications on File Prior to Visit  Medication Sig Dispense Refill  . aspirin EC 81 MG tablet Take 81 mg by mouth daily.    . calcium gluconate 500 MG tablet Take 1 tablet by mouth 3 (three) times daily.    . Everolimus (ZORTRESS) 0.25 MG TABS Take 0.75 mg by mouth 2 (two) times daily.    . furosemide (LASIX) 40 MG tablet Take by mouth.    . Multiple Vitamin (MULTIVITAMIN WITH MINERALS) TABS tablet Take 1 tablet by mouth daily.    . Mycophenolate Sodium (MYCOPHENOLIC ACID PO) Take 631 mg by mouth 2 (two) times daily.    . tadalafil (CIALIS) 10 MG tablet Take 1 tablet (10 mg total) by mouth daily as needed  for erectile dysfunction. 30 tablet 5   No current facility-administered medications on file prior to visit.     Allergies  Allergen Reactions  . Nsaids     Liver transplant 09/13/2015 Liver transplant 09/13/2015 Liver transplant 09/13/2015     Family History  Problem Relation Age of Onset  . Heart disease Sister   . Heart disease Brother   . Prostate cancer Brother   . Hyperlipidemia Mother   . Hyperlipidemia Father     BP 120/82 (BP Location: Right Arm, Patient Position: Sitting, Cuff Size: Large)   Pulse 73   Ht 6' (1.829 m)   Wt 244 lb (110.7 kg)   SpO2 93%   BMI 33.09 kg/m     Review of Systems Denies numbness, chest pain, skin rash, dyspnea, eye irritation, neck swelling, abd pain, depression, arthralgias, rhinorrhea, cold intolerance, and syncope.  He has lost a few lbs, due to his efforts.      Objective:   Physical Exam VS: see vs page GEN: no distress HEAD: head: no deformity eyes: no periorbital swelling, no proptosis external nose and  ears are normal.   NECK: supple, thyroid is not enlarged CHEST WALL: no deformity.  Old healed surgical scar.  LUNGS: clear to auscultation.  CV: reg rate and rhythm, no murmur.  ABD: abdomen is soft, nontender.  no hepatosplenomegaly.  not distended.  Self-reducing ventral hernia.  MUSCULOSKELETAL: muscle bulk and strength are grossly normal.  no obvious joint swelling.  gait is normal and steady EXTEMITIES: no deformity.  2+ bilat leg edema PULSES: no carotid bruit NEURO:  cn 2-12 grossly intact.   readily moves all 4's.  sensation is intact to touch on all 4's SKIN:  Normal texture and temperature.  No rash or suspicious lesion is visible.  No stigmata of dyslipidemia are seen NODES:  None palpable at the neck PSYCH: alert, well-oriented.  Does not appear anxious nor depressed.     Lab Results  Component Value Date   CHOL 246 (H) 02/17/2019   HDL 38.90 (L) 02/17/2019   LDLCALC 175 (H) 02/17/2019   TRIG 158.0 (H) 02/17/2019   CHOLHDL 6 02/17/2019     Lab Results  Component Value Date   CREATININE 1.13 02/17/2019   BUN 16 02/17/2019   NA 139 02/17/2019   K 4.5 02/17/2019   CL 105 02/17/2019   CO2 27 02/17/2019    I have reviewed outside records, and summarized:  Pt was noted to have dyslipidemia, and referred here.  He also had hepatocellular carcinoma prior to the transplant     Assessment & Plan:  Dyslipidemia, usually familial combined.  We'll rx LDL first Liver transplant: this was after dyslipidemia was dx'ed, so not due to everolimus (however, this med could worsen it) Alcoholism, resolved.  However, dyslipidemia could have been due to this at the time.      Patient Instructions  I have sent a prescription to your pharmacy, for the cholesterol.  Please redo the blood tests in 1 month, fasting.       Fat and Cholesterol Restricted Eating Plan Eating a diet that limits fat and cholesterol may help lower your risk for heart disease and other conditions. Your  body needs fat and cholesterol for basic functions, but eating too much of these things can be harmful to your health. Your health care provider may order lab tests to check your blood fat (lipid) and cholesterol levels. This helps your health care provider understand your risk for  certain conditions and whether you need to make diet changes. Work with your health care provider or dietitian to make an eating plan that is right for you. Your plan includes:  Limit your fat intake to 30 % or less of your total calories a day.  Limit your saturated fat intake to 10 % or less of your total calories a day.  Limit the amount of cholesterol in your diet to less than 300 mg a day.  Eat 10 g of fiber a day. What are tips for following this plan? General guidelines   If you are overweight, work with your health care provider to lose weight safely. Losing just 5-10% of your body weight can improve your overall health and help prevent diseases such as diabetes and heart disease.  Avoid: ? Foods with added sugar. ? Fried foods. ? Foods that contain partially hydrogenated oils, including stick margarine, some tub margarines, cookies, crackers, and other baked goods.  Limit alcohol intake to no more than 1 drink a day for nonpregnant women and 2 drinks a day for men. One drink equals 12 oz of beer, 5 oz of wine, or 1 oz of hard liquor. Reading food labels  Check food labels for: ? Trans fats, partially hydrogenated oils, or high amounts of saturated fat. Avoid foods that contain saturated fat and trans fat. ? The amount of cholesterol in each serving. Try to eat no more than 200 mg of cholesterol each day. ? The amount of fiber in each serving. Try to eat at least 20-30 g of fiber each day.  Choose foods with healthy fats, such as: ? Monounsaturated and polyunsaturated fats. These include olive and canola oil, flaxseeds, walnuts, almonds, and seeds. ? Omega-3 fats. These are found in foods such as  salmon, mackerel, sardines, tuna, flaxseed oil, and ground flaxseeds.  Choose grain products that have whole grains. Look for the word "whole" as the first word in the ingredient list. Cooking  Cook foods using methods other than frying. Baking, boiling, grilling, and broiling are some healthy options.  Eat more home-cooked food and less restaurant, buffet, and fast food.  Avoid cooking using saturated fats. ? Animal sources of saturated fats include meats, butter, and cream. ? Plant sources of saturated fats include palm oil, palm kernel oil, and coconut oil. Meal planning   At meals, imagine dividing your plate into fourths: ? Fill one-half of your plate with vegetables and green salads. ? Fill one-fourth of your plate with whole grains. ? Fill one-fourth of your plate with lean protein foods.  Eat fish that is high in omega-3 fats at least two times a week.  Eat more foods that contain fiber, such as whole grains, beans, apples, broccoli, carrots, peas, and barley. These foods help promote healthy cholesterol levels in the blood. Recommended foods Grains  Whole grains, such as whole wheat or whole grain breads, crackers, cereals, and pasta. Unsweetened oatmeal, bulgur, barley, quinoa, or brown rice. Corn or whole wheat flour tortillas. Vegetables  Fresh or frozen vegetables (raw, steamed, roasted, or grilled). Green salads. Fruits  All fresh, canned (in natural juice), or frozen fruits. Meats and other protein foods  Ground beef (85% or leaner), grass-fed beef, or beef trimmed of fat. Skinless chicken or [place]. Ground chicken or [place]. Pork trimmed of fat. All fish and seafood. Egg whites. Dried beans, peas, or lentils. Unsalted nuts or seeds. Unsalted canned beans. Natural nut butters without added sugar and oil. Dairy  Low-fat or nonfat  dairy products, such as skim or 1% milk, 2% or reduced-fat cheeses, low-fat and fat-free ricotta or cottage cheese, or plain low-fat and  nonfat yogurt. Fats and oils  Tub margarine without trans fats. Light or reduced-fat mayonnaise and salad dressings. Avocado. Olive, canola, sesame, or safflower oils. The items listed above may not be a complete list of recommended foods or beverages. Contact your dietitian for more options. Foods to avoid Grains  White bread. White pasta. White rice. Cornbread. Bagels, pastries, and croissants. Crackers and snack foods that contain trans fat and hydrogenated oils. Vegetables  Vegetables cooked in cheese, cream, or butter sauce. Fried vegetables. Fruits  Canned fruit in heavy syrup. Fruit in cream or butter sauce. Fried fruit. Meats and other protein foods  Fatty cuts of meat. Ribs, chicken wings, bacon, sausage, bologna, salami, chitterlings, fatback, hot dogs, bratwurst, and packaged lunch meats. Liver and organ meats. Whole eggs and egg yolks. Chicken and [place] with skin. Fried meat. Dairy  Whole or 2% milk, cream, half-and-half, and cream cheese. Whole milk cheeses. Whole-fat or sweetened yogurt. Full-fat cheeses. Nondairy creamers and whipped toppings. Processed cheese, cheese spreads, and cheese curds. Beverages  Alcohol. Sugar-sweetened drinks such as sodas, lemonade, and fruit drinks. Fats and oils  Butter, stick margarine, lard, shortening, ghee, or bacon fat. Coconut, palm kernel, and palm oils. Sweets and desserts  Corn syrup, sugars, honey, and molasses. Candy. Jam and jelly. Syrup. Sweetened cereals. Cookies, pies, cakes, donuts, muffins, and ice cream. The items listed above may not be a complete list of foods and beverages to avoid. Contact your dietitian for more information. Summary  Your body needs fat and cholesterol for basic functions. However, eating too much of these things can be harmful to your health.  Work with your health care provider and dietitian to follow a diet low in fat and cholesterol. Doing this may help lower your risk for heart disease and  other conditions.  Choose healthy fats, such as monounsaturated and polyunsaturated fats, and foods high in omega-3 fatty acids.  Eat fiber-rich foods, such as whole grains, beans, peas, fruits, and vegetables.  Limit or avoid alcohol, fried foods, and foods high in saturated fats, partially hydrogenated oils, and sugar. This information is not intended to replace advice given to you by your health care provider. Make sure you discuss any questions you have with your health care provider. Document Released: 08/25/2005 Document Revised: 08/07/2017 Document Reviewed: 05/12/2017 Elsevier Patient Education  2020 [employer].

## 2019-04-06 NOTE — Patient Instructions (Addendum)
I have sent a prescription to your pharmacy, for the cholesterol.  Please redo the blood tests in 1 month, fasting.       Fat and Cholesterol Restricted Eating Plan Eating a diet that limits fat and cholesterol may help lower your risk for heart disease and other conditions. Your body needs fat and cholesterol for basic functions, but eating too much of these things can be harmful to your health. Your health care provider may order lab tests to check your blood fat (lipid) and cholesterol levels. This helps your health care provider understand your risk for certain conditions and whether you need to make diet changes. Work with your health care provider or dietitian to make an eating plan that is right for you. Your plan includes:  Limit your fat intake to 30 % or less of your total calories a day.  Limit your saturated fat intake to 10 % or less of your total calories a day.  Limit the amount of cholesterol in your diet to less than 300 mg a day.  Eat 10 g of fiber a day. What are tips for following this plan? General guidelines   If you are overweight, work with your health care provider to lose weight safely. Losing just 5-10% of your body weight can improve your overall health and help prevent diseases such as diabetes and heart disease.  Avoid: ? Foods with added sugar. ? Fried foods. ? Foods that contain partially hydrogenated oils, including stick margarine, some tub margarines, cookies, crackers, and other baked goods.  Limit alcohol intake to no more than 1 drink a day for nonpregnant women and 2 drinks a day for men. One drink equals 12 oz of beer, 5 oz of wine, or 1 oz of hard liquor. Reading food labels  Check food labels for: ? Trans fats, partially hydrogenated oils, or high amounts of saturated fat. Avoid foods that contain saturated fat and trans fat. ? The amount of cholesterol in each serving. Try to eat no more than 200 mg of cholesterol each day. ? The amount  of fiber in each serving. Try to eat at least 20-30 g of fiber each day.  Choose foods with healthy fats, such as: ? Monounsaturated and polyunsaturated fats. These include olive and canola oil, flaxseeds, walnuts, almonds, and seeds. ? Omega-3 fats. These are found in foods such as salmon, mackerel, sardines, tuna, flaxseed oil, and ground flaxseeds.  Choose grain products that have whole grains. Look for the word "whole" as the first word in the ingredient list. Cooking  Cook foods using methods other than frying. Baking, boiling, grilling, and broiling are some healthy options.  Eat more home-cooked food and less restaurant, buffet, and fast food.  Avoid cooking using saturated fats. ? Animal sources of saturated fats include meats, butter, and cream. ? Plant sources of saturated fats include palm oil, palm kernel oil, and coconut oil. Meal planning   At meals, imagine dividing your plate into fourths: ? Fill one-half of your plate with vegetables and green salads. ? Fill one-fourth of your plate with whole grains. ? Fill one-fourth of your plate with lean protein foods.  Eat fish that is high in omega-3 fats at least two times a week.  Eat more foods that contain fiber, such as whole grains, beans, apples, broccoli, carrots, peas, and barley. These foods help promote healthy cholesterol levels in the blood. Recommended foods Grains  Whole grains, such as whole wheat or whole grain breads, crackers, cereals,  and pasta. Unsweetened oatmeal, bulgur, barley, quinoa, or brown rice. Corn or whole wheat flour tortillas. Vegetables  Fresh or frozen vegetables (raw, steamed, roasted, or grilled). Green salads. Fruits  All fresh, canned (in natural juice), or frozen fruits. Meats and other protein foods  Ground beef (85% or leaner), grass-fed beef, or beef trimmed of fat. Skinless chicken or [place]. Ground chicken or [place]. Pork trimmed of fat. All fish and seafood. Egg whites.  Dried beans, peas, or lentils. Unsalted nuts or seeds. Unsalted canned beans. Natural nut butters without added sugar and oil. Dairy  Low-fat or nonfat dairy products, such as skim or 1% milk, 2% or reduced-fat cheeses, low-fat and fat-free ricotta or cottage cheese, or plain low-fat and nonfat yogurt. Fats and oils  Tub margarine without trans fats. Light or reduced-fat mayonnaise and salad dressings. Avocado. Olive, canola, sesame, or safflower oils. The items listed above may not be a complete list of recommended foods or beverages. Contact your dietitian for more options. Foods to avoid Grains  White bread. White pasta. White rice. Cornbread. Bagels, pastries, and croissants. Crackers and snack foods that contain trans fat and hydrogenated oils. Vegetables  Vegetables cooked in cheese, cream, or butter sauce. Fried vegetables. Fruits  Canned fruit in heavy syrup. Fruit in cream or butter sauce. Fried fruit. Meats and other protein foods  Fatty cuts of meat. Ribs, chicken wings, bacon, sausage, bologna, salami, chitterlings, fatback, hot dogs, bratwurst, and packaged lunch meats. Liver and organ meats. Whole eggs and egg yolks. Chicken and [place] with skin. Fried meat. Dairy  Whole or 2% milk, cream, half-and-half, and cream cheese. Whole milk cheeses. Whole-fat or sweetened yogurt. Full-fat cheeses. Nondairy creamers and whipped toppings. Processed cheese, cheese spreads, and cheese curds. Beverages  Alcohol. Sugar-sweetened drinks such as sodas, lemonade, and fruit drinks. Fats and oils  Butter, stick margarine, lard, shortening, ghee, or bacon fat. Coconut, palm kernel, and palm oils. Sweets and desserts  Corn syrup, sugars, honey, and molasses. Candy. Jam and jelly. Syrup. Sweetened cereals. Cookies, pies, cakes, donuts, muffins, and ice cream. The items listed above may not be a complete list of foods and beverages to avoid. Contact your dietitian for more  information. Summary  Your body needs fat and cholesterol for basic functions. However, eating too much of these things can be harmful to your health.  Work with your health care provider and dietitian to follow a diet low in fat and cholesterol. Doing this may help lower your risk for heart disease and other conditions.  Choose healthy fats, such as monounsaturated and polyunsaturated fats, and foods high in omega-3 fatty acids.  Eat fiber-rich foods, such as whole grains, beans, peas, fruits, and vegetables.  Limit or avoid alcohol, fried foods, and foods high in saturated fats, partially hydrogenated oils, and sugar. This information is not intended to replace advice given to you by your health care provider. Make sure you discuss any questions you have with your health care provider. Document Released: 08/25/2005 Document Revised: 08/07/2017 Document Reviewed: 05/12/2017 Elsevier Patient Education  2020 [employer].

## 2019-04-15 ENCOUNTER — Ambulatory Visit: Payer: Medicare Other | Admitting: Adult Health

## 2019-04-19 ENCOUNTER — Ambulatory Visit: Payer: Medicare Other | Admitting: Adult Health

## 2019-04-22 ENCOUNTER — Other Ambulatory Visit: Payer: Self-pay

## 2019-04-22 ENCOUNTER — Emergency Department (HOSPITAL_COMMUNITY): Payer: Medicare Other

## 2019-04-22 ENCOUNTER — Encounter (HOSPITAL_COMMUNITY): Payer: Self-pay | Admitting: Emergency Medicine

## 2019-04-22 ENCOUNTER — Emergency Department (HOSPITAL_COMMUNITY)
Admission: EM | Admit: 2019-04-22 | Discharge: 2019-04-22 | Disposition: A | Discharge status: Home / Self Care | Payer: Medicare Other | Attending: Emergency Medicine | Admitting: Emergency Medicine

## 2019-04-22 DIAGNOSIS — Y9355 Activity, bike riding: Secondary | ICD-10-CM | POA: Diagnosis not present

## 2019-04-22 DIAGNOSIS — Z944 Liver transplant status: Secondary | ICD-10-CM | POA: Insufficient documentation

## 2019-04-22 DIAGNOSIS — Y999 Unspecified external cause status: Secondary | ICD-10-CM | POA: Insufficient documentation

## 2019-04-22 DIAGNOSIS — S42032A Displaced fracture of lateral end of left clavicle, initial encounter for closed fracture: Secondary | ICD-10-CM | POA: Insufficient documentation

## 2019-04-22 DIAGNOSIS — I2510 Atherosclerotic heart disease of native coronary artery without angina pectoris: Secondary | ICD-10-CM | POA: Insufficient documentation

## 2019-04-22 DIAGNOSIS — Z79899 Other long term (current) drug therapy: Secondary | ICD-10-CM | POA: Diagnosis not present

## 2019-04-22 DIAGNOSIS — Z951 Presence of aortocoronary bypass graft: Secondary | ICD-10-CM | POA: Diagnosis not present

## 2019-04-22 DIAGNOSIS — Z7982 Long term (current) use of aspirin: Secondary | ICD-10-CM | POA: Diagnosis not present

## 2019-04-22 DIAGNOSIS — Z87891 Personal history of nicotine dependence: Secondary | ICD-10-CM | POA: Diagnosis not present

## 2019-04-22 DIAGNOSIS — S4992XA Unspecified injury of left shoulder and upper arm, initial encounter: Secondary | ICD-10-CM | POA: Diagnosis present

## 2019-04-22 DIAGNOSIS — Y92414 Local residential or business street as the place of occurrence of the external cause: Secondary | ICD-10-CM | POA: Diagnosis not present

## 2019-04-22 DIAGNOSIS — I10 Essential (primary) hypertension: Secondary | ICD-10-CM | POA: Diagnosis not present

## 2019-04-22 MED ORDER — HYDROCODONE-ACETAMINOPHEN 5-325 MG PO TABS: 1.0000 | ORAL_TABLET | Freq: Four times a day (QID) | ORAL | 0 refills | Status: DC | PRN

## 2019-04-22 MED ORDER — HYDROCODONE-ACETAMINOPHEN 5-325 MG PO TABS
1.0000 | ORAL_TABLET | Freq: Once | ORAL | Status: AC
  Administered 2019-04-22: 1 via ORAL
  Filled 2019-04-22: qty 1

## 2019-04-22 NOTE — ED Notes (Signed)
Patient verbally understood discharge instructions and was in stable condition.
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
## 2019-04-22 NOTE — ED Notes (Signed)
Sling placed on pt for immobilization and comfort in triage.

## 2019-04-22 NOTE — ED Notes (Signed)
While preparing for discharge, patient reports left hand feeling cold. Radial pulses 2+ bilaterally, cap refill <3 seconds bilaterally, sensation intact. MD Bero and PA Hina made aware and assessed patient as well and cleared him for discharge.

## 2019-04-25 ENCOUNTER — Encounter: Payer: Self-pay | Admitting: Family Medicine

## 2019-04-25 ENCOUNTER — Ambulatory Visit (INDEPENDENT_AMBULATORY_CARE_PROVIDER_SITE_OTHER): Payer: Medicare Other | Admitting: Family Medicine

## 2019-04-25 VITALS — BP 163/100 | Temp 96.4°F

## 2019-04-25 DIAGNOSIS — R0789 Other chest pain: Secondary | ICD-10-CM | POA: Diagnosis not present

## 2019-04-25 DIAGNOSIS — S42032D Displaced fracture of lateral end of left clavicle, subsequent encounter for fracture with routine healing: Secondary | ICD-10-CM | POA: Diagnosis not present

## 2019-04-25 MED ORDER — HYDROCODONE-ACETAMINOPHEN 10-325 MG PO TABS: 1.0000 | ORAL_TABLET | Freq: Three times a day (TID) | ORAL | 0 refills | Status: DC | PRN
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
## 2019-04-27 ENCOUNTER — Encounter: Payer: Self-pay | Admitting: Family Medicine

## 2019-04-27 DIAGNOSIS — S42002A Fracture of unspecified part of left clavicle, initial encounter for closed fracture: Secondary | ICD-10-CM | POA: Diagnosis not present

## 2019-04-28 ENCOUNTER — Other Ambulatory Visit: Payer: Self-pay

## 2019-04-28 ENCOUNTER — Encounter (HOSPITAL_COMMUNITY): Payer: Self-pay | Admitting: *Deleted

## 2019-04-28 ENCOUNTER — Other Ambulatory Visit (HOSPITAL_COMMUNITY)
Admission: RE | Admit: 2019-04-28 | Discharge: 2019-04-28 | Disposition: A | Discharge status: Home / Self Care | Payer: Medicare Other | Source: Ambulatory Visit | Attending: Orthopedic Surgery | Admitting: Orthopedic Surgery

## 2019-04-28 DIAGNOSIS — Z01812 Encounter for preprocedural laboratory examination: Secondary | ICD-10-CM | POA: Diagnosis not present

## 2019-04-28 DIAGNOSIS — Z20828 Contact with and (suspected) exposure to other viral communicable diseases: Secondary | ICD-10-CM | POA: Insufficient documentation

## 2019-04-28 LAB — SARS CORONAVIRUS 2 (TAT 6-24 HRS): SARS Coronavirus 2: NEGATIVE

## 2019-04-28 NOTE — Anesthesia Preprocedure Evaluation (Addendum)
Anesthesia Evaluation  Patient identified by MRN, date of birth, ID band Patient awake    Reviewed: Allergy & Precautions, NPO status , Patient's Chart, lab work & pertinent test results  Airway Mallampati: II  TM Distance: >3 FB Neck ROM: Full    Dental no notable dental hx.    Pulmonary sleep apnea and Continuous Positive Airway Pressure Ventilation , pneumonia, former smoker,    Pulmonary exam normal breath sounds clear to auscultation       Cardiovascular hypertension, + CAD  Normal cardiovascular exam Rhythm:Regular Rate:Normal     Neuro/Psych  Headaches, negative psych ROS   GI/Hepatic negative GI ROS, Cirrhosis- alcoholic- Liver transplant 2017   Endo/Other  negative endocrine ROS  Renal/GU negative Renal ROS  negative genitourinary   Musculoskeletal negative musculoskeletal ROS (+)   Abdominal   Peds negative pediatric ROS (+)  Hematology negative hematology ROS (+)   Anesthesia Other Findings   Reproductive/Obstetrics negative OB ROS                           Anesthesia Physical Anesthesia Plan  ASA: III  Anesthesia Plan: General   Post-op Pain Management:    Induction: Intravenous  PONV Risk Score and Plan: 2 and Ondansetron and Treatment may vary due to age or medical condition  Airway Management Planned: Oral ETT and LMA  Additional Equipment:   Intra-op Plan:   Post-operative Plan: Extubation in OR  Informed Consent:   Plan Discussed with: Anesthesiologist, CRNA and Surgeon  Anesthesia Plan Comments: (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 Follows with transplant team at [facility]. Per telephone encounter 04/27/19 they are aware of surgery plans.  CAD S/p CABG 2010 at [facility]. Pt moved to [place] and subsequent cardiac care was all done there. He reports his pre-transplant cardiac workup was benign however we  do not have the records. He has not seen a cardiologist here since moving back about a year ago (currently scheduled to re-establish in October). He reports good functional status. Bikes recreationally, walks most days, plays several rounds of golf per week. He denies any CV c/p, no angina, no DOE.  Cardiac records were requested from [place] listed in [place], received response stating no cardiac records on file.  Case discussed with Dr. Valma Cava. He advised that given pt report of good functional status can proceed as planned. Will need DOS eval.  Note from Dr. Harden Mo 08/07/2014 in care everywhere states "Echocardiogram is done in my office shows a normal ejection fraction of 60 percent. He had only mild to moderate diastolic dysfunction." )    Anesthesia Quick Evaluation
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
## 2019-04-28 NOTE — Progress Notes (Addendum)
Brandon [name] denies chest pain or shortness of  breath.  Brandon [name] denies that he nor family have had any s/s of Covid, [patient was tested this am and is in quarantine with his wife. PCP is Dr Henrene Pastor.  Brandon Som[place] has not seen a cardiologist in a long time.  Patient had CABG in 2010, patient has an appointment in September with Dr Marlou Porch (wife said this is who he saw in the past). I asked anesthesia PA-C to review chart.

## 2019-04-29 ENCOUNTER — Ambulatory Visit (HOSPITAL_COMMUNITY): Payer: Medicare Other

## 2019-04-29 ENCOUNTER — Encounter (HOSPITAL_COMMUNITY)
Admission: RE | Disposition: A | Discharge status: Home / Self Care | Payer: Self-pay | Source: Home / Self Care | Attending: Orthopedic Surgery

## 2019-04-29 ENCOUNTER — Encounter (HOSPITAL_COMMUNITY): Payer: Self-pay | Admitting: *Deleted

## 2019-04-29 ENCOUNTER — Ambulatory Visit (HOSPITAL_COMMUNITY): Payer: Medicare Other | Admitting: Physician Assistant

## 2019-04-29 ENCOUNTER — Ambulatory Visit (HOSPITAL_COMMUNITY)
Admission: RE | Admit: 2019-04-29 | Discharge: 2019-04-29 | Disposition: A | Discharge status: Home / Self Care | Payer: Medicare Other | Attending: Orthopedic Surgery | Admitting: Orthopedic Surgery

## 2019-04-29 ENCOUNTER — Other Ambulatory Visit: Payer: Self-pay

## 2019-04-29 DIAGNOSIS — I2510 Atherosclerotic heart disease of native coronary artery without angina pectoris: Secondary | ICD-10-CM | POA: Diagnosis not present

## 2019-04-29 DIAGNOSIS — Z7982 Long term (current) use of aspirin: Secondary | ICD-10-CM | POA: Insufficient documentation

## 2019-04-29 DIAGNOSIS — S42022A Displaced fracture of shaft of left clavicle, initial encounter for closed fracture: Secondary | ICD-10-CM | POA: Diagnosis not present

## 2019-04-29 DIAGNOSIS — Z951 Presence of aortocoronary bypass graft: Secondary | ICD-10-CM | POA: Diagnosis not present

## 2019-04-29 DIAGNOSIS — E785 Hyperlipidemia, unspecified: Secondary | ICD-10-CM | POA: Insufficient documentation

## 2019-04-29 DIAGNOSIS — Z87891 Personal history of nicotine dependence: Secondary | ICD-10-CM | POA: Insufficient documentation

## 2019-04-29 DIAGNOSIS — Z944 Liver transplant status: Secondary | ICD-10-CM | POA: Diagnosis not present

## 2019-04-29 DIAGNOSIS — Z79899 Other long term (current) drug therapy: Secondary | ICD-10-CM | POA: Insufficient documentation

## 2019-04-29 DIAGNOSIS — X58XXXA Exposure to other specified factors, initial encounter: Secondary | ICD-10-CM | POA: Diagnosis not present

## 2019-04-29 DIAGNOSIS — I10 Essential (primary) hypertension: Secondary | ICD-10-CM | POA: Diagnosis not present

## 2019-04-29 DIAGNOSIS — G4730 Sleep apnea, unspecified: Secondary | ICD-10-CM | POA: Insufficient documentation

## 2019-04-29 DIAGNOSIS — S42002A Fracture of unspecified part of left clavicle, initial encounter for closed fracture: Secondary | ICD-10-CM | POA: Diagnosis not present

## 2019-04-29 DIAGNOSIS — Z01818 Encounter for other preprocedural examination: Secondary | ICD-10-CM

## 2019-04-29 DIAGNOSIS — J9811 Atelectasis: Secondary | ICD-10-CM | POA: Diagnosis not present

## 2019-04-29 DIAGNOSIS — T148XXA Other injury of unspecified body region, initial encounter: Secondary | ICD-10-CM

## 2019-04-29 DIAGNOSIS — G4733 Obstructive sleep apnea (adult) (pediatric): Secondary | ICD-10-CM | POA: Diagnosis not present

## 2019-04-29 DIAGNOSIS — S42022D Displaced fracture of shaft of left clavicle, subsequent encounter for fracture with routine healing: Secondary | ICD-10-CM | POA: Diagnosis not present

## 2019-04-29 HISTORY — DX: Malignant (primary) neoplasm, unspecified: C80.1

## 2019-04-29 HISTORY — DX: Hepatic failure, unspecified without coma: K72.90

## 2019-04-29 HISTORY — DX: Pneumonia, unspecified organism: J18.9

## 2019-04-29 HISTORY — PX: ORIF CLAVICULAR FRACTURE: SHX5055

## 2019-04-29 LAB — CBC WITH DIFFERENTIAL/PLATELET
Abs Immature Granulocytes: 0.03 10*3/uL (ref 0.00–0.07)
Basophils Absolute: 0.1 10*3/uL (ref 0.0–0.1)
Basophils Relative: 1 %
Eosinophils Absolute: 0.2 10*3/uL (ref 0.0–0.5)
Eosinophils Relative: 3 %
HCT: 45.1 % (ref 39.0–52.0)
Hemoglobin: 14.8 g/dL (ref 13.0–17.0)
Immature Granulocytes: 0 %
Lymphocytes Relative: 22 %
Lymphs Abs: 1.6 10*3/uL (ref 0.7–4.0)
MCH: 29 pg (ref 26.0–34.0)
MCHC: 32.8 g/dL (ref 30.0–36.0)
MCV: 88.4 fL (ref 80.0–100.0)
Monocytes Absolute: 0.6 10*3/uL (ref 0.1–1.0)
Monocytes Relative: 8 %
Neutro Abs: 4.8 10*3/uL (ref 1.7–7.7)
Neutrophils Relative %: 66 %
Platelets: 212 10*3/uL (ref 150–400)
RBC: 5.1 MIL/uL (ref 4.22–5.81)
RDW: 12.6 % (ref 11.5–15.5)
WBC: 7.2 10*3/uL (ref 4.0–10.5)
nRBC: 0 % (ref 0.0–0.2)

## 2019-04-29 LAB — COMPREHENSIVE METABOLIC PANEL
ALT: 45 U/L — ABNORMAL HIGH (ref 0–44)
AST: 36 U/L (ref 15–41)
Albumin: 3.6 g/dL (ref 3.5–5.0)
Alkaline Phosphatase: 84 U/L (ref 38–126)
Anion gap: 11 (ref 5–15)
BUN: 18 mg/dL (ref 8–23)
CO2: 23 mmol/L (ref 22–32)
Calcium: 9.3 mg/dL (ref 8.9–10.3)
Chloride: 104 mmol/L (ref 98–111)
Creatinine, Ser: 1.11 mg/dL (ref 0.61–1.24)
GFR calc Af Amer: >60 mL/min (ref >60)
GFR calc non Af Amer: >60 mL/min (ref >60)
Glucose, Bld: 97 mg/dL (ref 70–99)
Potassium: 3.9 mmol/L (ref 3.5–5.1)
Sodium: 138 mmol/L (ref 135–145)
Total Bilirubin: 1.2 mg/dL (ref 0.3–1.2)
Total Protein: 6.8 g/dL (ref 6.5–8.1)

## 2019-04-29 LAB — PROTIME-INR
INR: 1.1 (ref 0.8–1.2)
Prothrombin Time: 14.4 seconds (ref 11.4–15.2)

## 2019-04-29 LAB — APTT: aPTT: 31 seconds (ref 24–36)

## 2019-04-29 SURGERY — OPEN REDUCTION INTERNAL FIXATION (ORIF) CLAVICULAR FRACTURE
Anesthesia: General | Laterality: Left

## 2019-04-29 MED ORDER — HYDRALAZINE HCL 20 MG/ML IJ SOLN
10.0000 mg | Freq: Once | INTRAMUSCULAR | Status: AC
  Administered 2019-04-29: 10 mg via INTRAVENOUS

## 2019-04-29 MED ORDER — PROPOFOL 10 MG/ML IV BOLUS
INTRAVENOUS | Status: DC | PRN
  Administered 2019-04-29: 200 mg via INTRAVENOUS

## 2019-04-29 MED ORDER — FENTANYL CITRATE (PF) 100 MCG/2ML IJ SOLN
25.0000 ug | INTRAMUSCULAR | Status: DC | PRN
  Administered 2019-04-29: 25 ug via INTRAVENOUS

## 2019-04-29 MED ORDER — OXYCODONE HCL 5 MG/5ML PO SOLN: 5.0000 mg | Freq: Once | ORAL | Status: AC | PRN

## 2019-04-29 MED ORDER — HYDROCODONE-ACETAMINOPHEN 10-325 MG PO TABS: 1.0000 | ORAL_TABLET | Freq: Four times a day (QID) | ORAL | 0 refills | Status: DC | PRN

## 2019-04-29 MED ORDER — 0.9 % SODIUM CHLORIDE (POUR BTL) OPTIME
TOPICAL | Status: DC | PRN
  Administered 2019-04-29: 1000 mL

## 2019-04-29 MED ORDER — FENTANYL CITRATE (PF) 250 MCG/5ML IJ SOLN
INTRAMUSCULAR | Status: DC | PRN
  Administered 2019-04-29: 150 ug via INTRAVENOUS
  Administered 2019-04-29 (×2): 100 ug via INTRAVENOUS

## 2019-04-29 MED ORDER — OXYCODONE HCL 5 MG PO TABS
5.0000 mg | ORAL_TABLET | Freq: Once | ORAL | Status: AC | PRN
  Administered 2019-04-29: 5 mg via ORAL

## 2019-04-29 MED ORDER — DEXAMETHASONE SODIUM PHOSPHATE 10 MG/ML IJ SOLN
INTRAMUSCULAR | Status: DC | PRN
  Administered 2019-04-29: 10 mg via INTRAVENOUS

## 2019-04-29 MED ORDER — PROPOFOL 10 MG/ML IV BOLUS
INTRAVENOUS | Status: AC
  Filled 2019-04-29: qty 20

## 2019-04-29 MED ORDER — FENTANYL CITRATE (PF) 250 MCG/5ML IJ SOLN
INTRAMUSCULAR | Status: AC
  Filled 2019-04-29: qty 5

## 2019-04-29 MED ORDER — METHOCARBAMOL 500 MG PO TABS: 500.0000 mg | ORAL_TABLET | Freq: Three times a day (TID) | ORAL | 0 refills | Status: DC | PRN

## 2019-04-29 MED ORDER — OXYCODONE HCL 5 MG PO TABS
ORAL_TABLET | ORAL | Status: AC
  Filled 2019-04-29: qty 1

## 2019-04-29 MED ORDER — CHLORHEXIDINE GLUCONATE 4 % EX LIQD: 60.0000 mL | Freq: Once | CUTANEOUS | Status: DC

## 2019-04-29 MED ORDER — SUCCINYLCHOLINE CHLORIDE 20 MG/ML IJ SOLN
INTRAMUSCULAR | Status: DC | PRN
  Administered 2019-04-29: 120 mg via INTRAVENOUS

## 2019-04-29 MED ORDER — MIDAZOLAM HCL 2 MG/2ML IJ SOLN
INTRAMUSCULAR | Status: AC
  Filled 2019-04-29: qty 2

## 2019-04-29 MED ORDER — MIDAZOLAM HCL 5 MG/5ML IJ SOLN
INTRAMUSCULAR | Status: DC | PRN
  Administered 2019-04-29: 2 mg via INTRAVENOUS

## 2019-04-29 MED ORDER — BUPIVACAINE LIPOSOME 1.3 % IJ SUSP
20.0000 mL | Freq: Once | INTRAMUSCULAR | Status: DC
  Filled 2019-04-29: qty 20

## 2019-04-29 MED ORDER — ONDANSETRON 4 MG PO TBDP: 4.0000 mg | ORAL_TABLET | Freq: Three times a day (TID) | ORAL | 0 refills | Status: DC | PRN

## 2019-04-29 MED ORDER — ONDANSETRON HCL 4 MG/2ML IJ SOLN
INTRAMUSCULAR | Status: DC | PRN
  Administered 2019-04-29: 4 mg via INTRAVENOUS

## 2019-04-29 MED ORDER — ONDANSETRON HCL 4 MG/2ML IJ SOLN
INTRAMUSCULAR | Status: AC
  Filled 2019-04-29: qty 2

## 2019-04-29 MED ORDER — LACTATED RINGERS IV SOLN
INTRAVENOUS | Status: DC
  Administered 2019-04-29 (×2): via INTRAVENOUS

## 2019-04-29 MED ORDER — ACETAMINOPHEN 160 MG/5ML PO SOLN: 325.0000 mg | ORAL | Status: DC | PRN

## 2019-04-29 MED ORDER — MEPERIDINE HCL 25 MG/ML IJ SOLN: 6.2500 mg | INTRAMUSCULAR | Status: DC | PRN

## 2019-04-29 MED ORDER — ACETAMINOPHEN 325 MG PO TABS: 325.0000 mg | ORAL_TABLET | ORAL | Status: DC | PRN

## 2019-04-29 MED ORDER — HYDRALAZINE HCL 20 MG/ML IJ SOLN
INTRAMUSCULAR | Status: AC
  Filled 2019-04-29: qty 1

## 2019-04-29 MED ORDER — ROCURONIUM BROMIDE 10 MG/ML (PF) SYRINGE
PREFILLED_SYRINGE | INTRAVENOUS | Status: DC | PRN
  Administered 2019-04-29: 30 mg via INTRAVENOUS
  Administered 2019-04-29: 50 mg via INTRAVENOUS
  Administered 2019-04-29: 20 mg via INTRAVENOUS

## 2019-04-29 MED ORDER — FENTANYL CITRATE (PF) 100 MCG/2ML IJ SOLN
INTRAMUSCULAR | Status: AC
  Filled 2019-04-29: qty 2

## 2019-04-29 MED ORDER — ONDANSETRON HCL 4 MG/2ML IJ SOLN
4.0000 mg | Freq: Once | INTRAMUSCULAR | Status: AC | PRN
  Administered 2019-04-29: 4 mg via INTRAVENOUS

## 2019-04-29 MED ORDER — SODIUM CHLORIDE (PF) 0.9 % IJ SOLN
INTRAMUSCULAR | Status: DC | PRN
  Administered 2019-04-29: 20 mL

## 2019-04-29 MED ORDER — SUGAMMADEX SODIUM 200 MG/2ML IV SOLN
INTRAVENOUS | Status: DC | PRN
  Administered 2019-04-29: 300 mg via INTRAVENOUS

## 2019-04-29 MED ORDER — CEFAZOLIN SODIUM-DEXTROSE 2-4 GM/100ML-% IV SOLN
2.0000 g | INTRAVENOUS | Status: AC
  Administered 2019-04-29: 2 g via INTRAVENOUS
  Filled 2019-04-29: qty 100

## 2019-04-29 MED ORDER — BUPIVACAINE LIPOSOME 1.3 % IJ SUSP
INTRAMUSCULAR | Status: DC | PRN
  Administered 2019-04-29: 20 mL

## 2019-04-29 MED ORDER — LIDOCAINE 2% (20 MG/ML) 5 ML SYRINGE
INTRAMUSCULAR | Status: DC | PRN
  Administered 2019-04-29: 100 mg via INTRAVENOUS

## 2019-04-29 MED ORDER — DEXMEDETOMIDINE HCL 200 MCG/2ML IV SOLN
INTRAVENOUS | Status: DC | PRN
  Administered 2019-04-29: 20 ug via INTRAVENOUS

## 2019-04-29 SURGICAL SUPPLY — 58 items
BIT DRILL 2.3 QUICK RELEASE (BIT) ×1 IMPLANT
BIT DRILL 2.8 QUICK RELEASE (BIT) ×1 IMPLANT
BIT DRILL 3.0X5 QUICK RELEASE (DRILL) ×1 IMPLANT
BRUSH SCRUB EZ PLAIN DRY (MISCELLANEOUS) ×4 IMPLANT
COVER SURGICAL LIGHT HANDLE (MISCELLANEOUS) ×2 IMPLANT
DRAPE C-ARM 42X72 X-RAY (DRAPES) ×2 IMPLANT
DRAPE INCISE IOBAN 66X45 STRL (DRAPES) ×2 IMPLANT
DRAPE LAPAROTOMY TRNSV 102X78 (DRAPES) ×2 IMPLANT
DRAPE U-SHAPE 47X51 STRL (DRAPES) ×4 IMPLANT
DRILL 2.3 QUICK RELEASE (BIT) ×2
DRILL 2.8 QUICK RELEASE (BIT) ×2
DRILL 3.0X5 QUICK RELEASE (DRILL) ×2
DRSG MEPILEX BORDER 4X8 (GAUZE/BANDAGES/DRESSINGS) ×2 IMPLANT
ELECT REM PT RETURN 9FT ADLT (ELECTROSURGICAL) ×2
ELECTRODE REM PT RTRN 9FT ADLT (ELECTROSURGICAL) ×1 IMPLANT
GLOVE BIO SURGEON STRL SZ8 (GLOVE) ×2 IMPLANT
GLOVE BIOGEL PI IND STRL 8 (GLOVE) ×1 IMPLANT
GLOVE BIOGEL PI INDICATOR 8 (GLOVE) ×1
GOWN STRL REUS W/ TWL LRG LVL3 (GOWN DISPOSABLE) ×2 IMPLANT
GOWN STRL REUS W/ TWL XL LVL3 (GOWN DISPOSABLE) ×1 IMPLANT
GOWN STRL REUS W/TWL LRG LVL3 (GOWN DISPOSABLE) ×2
GOWN STRL REUS W/TWL XL LVL3 (GOWN DISPOSABLE) ×1
GUIDEWIRE ORTHO MINI ACTK .045 (WIRE) ×2 IMPLANT
K-WIRE .045X4 (WIRE) ×2 IMPLANT
KIT BASIN OR (CUSTOM PROCEDURE TRAY) ×2 IMPLANT
KIT TURNOVER KIT B (KITS) ×2 IMPLANT
NEEDLE HYPO 22GX1.5 SAFETY (NEEDLE) ×2 IMPLANT
NS IRRIG 1000ML POUR BTL (IV SOLUTION) ×2 IMPLANT
PACK GENERAL/GYN (CUSTOM PROCEDURE TRAY) ×2 IMPLANT
PAD ARMBOARD 7.5X6 YLW CONV (MISCELLANEOUS) ×4 IMPLANT
PLATE CLAVICLE 10 HOLE (Plate) ×2 IMPLANT
SCREW BONE NLCKG 3.0X18 HEXA (Screw) ×1 IMPLANT
SCREW BONE NONLOCK 3.0X18 (Screw) ×2 IMPLANT
SCREW HEXALOBE LOCKING 3.5X14M (Screw) ×2 IMPLANT
SCREW HEXALOBE NON-LOCK 3.5X14 (Screw) ×2 IMPLANT
SCREW HEXALOBE NON-LOCK 3.5X16 (Screw) ×2 IMPLANT
SCREW LOCK 12X3.5X HEXALOBE (Screw) ×3 IMPLANT
SCREW LOCK 18X3.5X HEXALOBE (Screw) ×1 IMPLANT
SCREW LOCKING 3.5X12 (Screw) ×3 IMPLANT
SCREW LOCKING 3.5X18MM (Screw) ×1 IMPLANT
SCREW NON LOCK 3.0X14 (Screw) ×2 IMPLANT
SCREW NON LOCKING HEX 3.5X18MM (Screw) ×2 IMPLANT
SLING ARM FOAM STRAP XLG (SOFTGOODS) ×2 IMPLANT
STAPLER VISISTAT 35W (STAPLE) ×2 IMPLANT
STRIP CLOSURE SKIN 1/2X4 (GAUZE/BANDAGES/DRESSINGS) ×2 IMPLANT
SUCTION FRAZIER HANDLE 10FR (MISCELLANEOUS) ×1
SUCTION TUBE FRAZIER 10FR DISP (MISCELLANEOUS) ×1 IMPLANT
SUT MNCRL AB 3-0 PS2 27 (SUTURE) ×2 IMPLANT
SUT VIC AB 0 CT1 27 (SUTURE) ×1
SUT VIC AB 0 CT1 27XBRD ANBCTR (SUTURE) ×1 IMPLANT
SUT VIC AB 1 CTX 36 (SUTURE) ×1
SUT VIC AB 1 CTX36XBRD ANBCTR (SUTURE) ×1 IMPLANT
SUT VIC AB 2-0 CT1 27 (SUTURE) ×1
SUT VIC AB 2-0 CT1 TAPERPNT 27 (SUTURE) ×1 IMPLANT
SYR CONTROL 10ML LL (SYRINGE) ×2 IMPLANT
TOWEL GREEN STERILE (TOWEL DISPOSABLE) ×2 IMPLANT
TOWEL GREEN STERILE FF (TOWEL DISPOSABLE) ×2 IMPLANT
WATER STERILE IRR 1000ML POUR (IV SOLUTION) ×2 IMPLANT

## 2019-04-29 NOTE — Anesthesia Postprocedure Evaluation (Signed)
Anesthesia Post Note  Patient: Brandon [name]  Procedure(s) Performed: OPEN REDUCTION INTERNAL FIXATION (ORIF) CLAVICULAR FRACTURE (Left )     Patient location during evaluation: PACU Anesthesia Type: General Level of consciousness: awake and alert Pain management: pain level controlled Vital Signs Assessment: post-procedure vital signs reviewed and stable Respiratory status: spontaneous breathing, nonlabored ventilation, respiratory function stable and patient connected to nasal cannula oxygen Cardiovascular status: blood pressure returned to baseline and stable Postop Assessment: no apparent nausea or vomiting Anesthetic complications: no    Last Vitals:  Vitals:   04/29/19 1320 04/29/19 1330  BP: (!) 156/96   Pulse: 75 77  Resp: (!) 26 (!) 22  Temp:    SpO2: 94% 93%    Last Pain:  Vitals:   04/29/19 1320  TempSrc:   PainSc: Asleep                 Shawniece Oyola

## 2019-04-29 NOTE — Anesthesia Procedure Notes (Signed)
Procedure Name: Intubation Date/Time: 04/29/2019 9:51 AM Performed by: Mariea Clonts, CRNA Pre-anesthesia Checklist: Patient identified, Emergency Drugs available, Suction available and Patient being monitored Patient Re-evaluated:Patient Re-evaluated prior to induction Oxygen Delivery Method: Circle System Utilized Preoxygenation: Pre-oxygenation with 100% oxygen Induction Type: IV induction Ventilation: Mask ventilation without difficulty Laryngoscope Size: Mac and 4 Grade View: Grade I Tube type: Oral Tube size: 8.0 mm Number of attempts: 1 Airway Equipment and Method: Stylet and Oral airway Placement Confirmation: ETT inserted through vocal cords under direct vision,  positive ETCO2 and breath sounds checked- equal and bilateral Tube secured with: Tape Dental Injury: Teeth and Oropharynx as per pre-operative assessment

## 2019-04-29 NOTE — Discharge Instructions (Addendum)
Orthopa[facility]uma Service Discharge Instructions   General Discharge Instructions  WEIGHT BEARING STATUS:  No lifting heavier than 5 lbs with left arm  RANGE OF MOTION/ACTIVITY: ok to move left shoulder, sling for comfort and when walking around. Activity as tolerated otherwise   Wound Care: daily dressing changes starting on 05/02/2019. Please see below  Discharge Wound Care Instructions  Do NOT apply any ointments, solutions or lotions to pin sites or surgical wounds.  These prevent needed drainage and even though solutions like hydrogen peroxide kill bacteria, they also damage cells lining the pin sites that help fight infection.  Applying lotions or ointments can keep the wounds moist and can cause them to breakdown and open up as well. This can increase the risk for infection. When in doubt call the office.  Surgical incisions should be dressed daily.  If any drainage is noted, use one layer of adaptic, then 4x4 gauze and tape. Alternatively you can use a mepilex type dressing   Once the incision is completely dry and without drainage, it may be left open to air out.  Showering may begin 36-48 hours later.  Cleaning gently with soap and water.    Diet: as you were eating previously.  Can use over the counter stool softeners and bowel preparations, such as Miralax, to help with bowel movements.  Narcotics can be constipating.  Be sure to drink plenty of fluids  PAIN MEDICATION USE AND EXPECTATIONS  You have likely been given narcotic medications to help control your pain.  After a traumatic event that results in an fracture (broken bone) with or without surgery, it is ok to use narcotic pain medications to help control one's pain.  We understand that everyone responds to pain differently and each individual patient will be evaluated on a regular basis for the continued need for narcotic medications. Ideally, narcotic medication use should last no more than 6-8 weeks (coinciding with  fracture healing).   As a patient it is your responsibility as well to monitor narcotic medication use and report the amount and frequency you use these medications when you come to your office visit.   We would also advise that if you are using narcotic medications, you should take a dose prior to therapy to maximize you participation.  IF YOU ARE ON NARCOTIC MEDICATIONS IT IS NOT PERMISSIBLE TO OPERATE A MOTOR VEHICLE (MOTORCYCLE/CAR/TRUCK/MOPED) OR HEAVY MACHINERY DO NOT MIX NARCOTICS WITH OTHER CNS (CENTRAL NERVOUS SYSTEM) DEPRESSANTS SUCH AS ALCOHOL   STOP SMOKING OR USING NICOTINE PRODUCTS!!!!  As discussed nicotine severely impairs your body's ability to heal surgical and traumatic wounds but also impairs bone healing.  Wounds and bone heal by forming microscopic blood vessels (angiogenesis) and nicotine is a vasoconstrictor (essentially, shrinks blood vessels).  Therefore, if vasoconstriction occurs to these microscopic blood vessels they essentially disappear and are unable to deliver necessary nutrients to the healing tissue.  This is one modifiable factor that you can do to dramatically increase your chances of healing your injury.    (This means no smoking, no nicotine gum, patches, etc)  DO NOT USE NONSTEROIDAL ANTI-INFLAMMATORY DRUGS (NSAID'S)  Using products such as Advil (ibuprofen), Aleve (naproxen), Motrin (ibuprofen) for additional pain control during fracture healing can delay and/or prevent the healing response.  If you would like to take over the counter (OTC) medication, Tylenol (acetaminophen) is ok.  However, some narcotic medications that are given for pain control contain acetaminophen as well. Therefore, you should not exceed more than  4000 mg of tylenol in a day if you do not have liver disease.  Also note that there are may OTC medicines, such as cold medicines and allergy medicines that my contain tylenol as well.  If you have any questions about medications and/or  interactions please ask your doctor/PA or your pharmacist.      ICE AND ELEVATE INJURED/OPERATIVE EXTREMITY  Using ice and elevating the injured extremity above your heart can help with swelling and pain control.  Icing in a pulsatile fashion, such as 20 minutes on and 20 minutes off, can be followed.    Do not place ice directly on skin. Make sure there is a barrier between to skin and the ice pack.    Using frozen items such as frozen peas works well as the conform nicely to the are that needs to be iced.  USE AN ACE WRAP OR TED HOSE FOR SWELLING CONTROL  In addition to icing and elevation, Ace wraps or TED hose are used to help limit and resolve swelling.  It is recommended to use Ace wraps or TED hose until you are informed to stop.    When using Ace Wraps start the wrapping distally (farthest away from the body) and wrap proximally (closer to the body)   Example: If you had surgery on your leg or thing and you do not have a splint on, start the ace wrap at the toes and work your way up to the thigh        If you had surgery on your upper extremity and do not have a splint on, start the ace wrap at your fingers and work your way up to the upper arm  IF YOU ARE IN A SPLINT OR CAST DO NOT [place]   If your splint gets wet for any reason please contact the office immediately. You may shower in your splint or cast as long as you keep it dry.  This can be done by wrapping in a cast cover or garbage back (or similar)  Do Not stick any thing down your splint or cast such as pencils, money, or hangers to try and scratch yourself with.  If you feel itchy take benadryl as prescribed on the bottle for itching  IF YOU ARE IN A CAM BOOT (BLACK BOOT)  You may remove boot periodically. Perform daily dressing changes as noted below.  Wash the liner of the boot regularly and wear a sock when wearing the boot. It is recommended that you sleep in the boot until told otherwise    CALL THE  OFFICE WITH ANY QUESTIONS OR CONCERNS: [phone]   VISIT OUR WEBSITE FOR ADDITIONAL INFORMATION: [email or web address]
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
## 2019-04-29 NOTE — Transfer of Care (Signed)
Immediate Anesthesia Transfer of Care Note  Patient: Brandon [name]  Procedure(s) Performed: OPEN REDUCTION INTERNAL FIXATION (ORIF) CLAVICULAR FRACTURE (Left )  Patient Location: PACU  Anesthesia Type:General  Level of Consciousness: awake, alert  and oriented  Airway & Oxygen Therapy: Patient Spontanous Breathing and Patient connected to face mask oxygen  Post-op Assessment: Report given to RN, Post -op Vital signs reviewed and stable and Patient moving all extremities X 4  Post vital signs: Reviewed and stable  Last Vitals:  Vitals Value Taken Time  BP 177/90 04/29/19 1233  Temp 36.6 C 04/29/19 1232  Pulse 69 04/29/19 1239  Resp 19 04/29/19 1239  SpO2 93 % 04/29/19 1239  Vitals shown include unvalidated device data.  Last Pain:  Vitals:   04/29/19 1232  TempSrc:   PainSc: Asleep      Patients Stated Pain Goal: 5 ([id number])  Complications: No apparent anesthesia complications

## 2019-05-02 ENCOUNTER — Encounter (HOSPITAL_COMMUNITY): Payer: Self-pay | Admitting: Orthopedic Surgery

## 2019-05-10 ENCOUNTER — Other Ambulatory Visit: Payer: Self-pay

## 2019-05-10 ENCOUNTER — Encounter: Payer: Self-pay | Admitting: Family Medicine

## 2019-05-10 MED ORDER — FUROSEMIDE 40 MG PO TABS: 40.0000 mg | ORAL_TABLET | Freq: Every day | ORAL | 1 refills | Status: DC

## 2019-05-11 DIAGNOSIS — S42002D Fracture of unspecified part of left clavicle, subsequent encounter for fracture with routine healing: Secondary | ICD-10-CM | POA: Diagnosis not present

## 2019-05-12 ENCOUNTER — Encounter: Payer: Self-pay | Admitting: Family Medicine

## 2019-05-12 ENCOUNTER — Other Ambulatory Visit: Payer: Self-pay

## 2019-05-12 DIAGNOSIS — Z1211 Encounter for screening for malignant neoplasm of colon: Secondary | ICD-10-CM

## 2019-05-13 ENCOUNTER — Encounter: Payer: Self-pay | Admitting: Family Medicine

## 2019-05-13 NOTE — Telephone Encounter (Signed)
Pt has been scheduled for Flu vaccine.

## 2019-05-17 DIAGNOSIS — G4733 Obstructive sleep apnea (adult) (pediatric): Secondary | ICD-10-CM
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
## 2019-05-18 NOTE — Telephone Encounter (Addendum)
Sent MyChart message to pt with CY's response below. Will await a response from pt.

## 2019-05-18 NOTE — Telephone Encounter (Signed)
To get insurance to pay for a BIPAP machine, which is more expensive, we have to show that standard CPAP is insufficient or not tolerated. To meet this documentation rule, we would need to do an in-center CPAP-BIPAP titration sleep study. This would document what pressure range is needed. This is a standard insurance requirement.

## 2019-05-19 DIAGNOSIS — Z23 Encounter for immunization: Secondary | ICD-10-CM | POA: Diagnosis not present

## 2019-05-19 NOTE — Addendum Note (Signed)
Addended by: Annie Paras D on: 05/19/2019 01:53 PM   Modules accepted: Orders

## 2019-05-19 NOTE — Telephone Encounter (Signed)
Pt responded to previous MyChart message stating he would like to proceed with CPAP-BIPAP titration study; therefore, I have placed the order and made pt aware that someone will be reaching out to him to schedule this study. Order has been placed. Nothing further needed at this time.

## 2019-05-24 ENCOUNTER — Ambulatory Visit: Payer: Medicare Other

## 2019-05-30 ENCOUNTER — Encounter (HOSPITAL_BASED_OUTPATIENT_CLINIC_OR_DEPARTMENT_OTHER): Payer: Medicare Other | Admitting: Internal Medicine

## 2019-06-02 ENCOUNTER — Encounter: Payer: Self-pay | Admitting: Family Medicine

## 2019-06-03 ENCOUNTER — Other Ambulatory Visit (HOSPITAL_COMMUNITY)
Admission: RE | Admit: 2019-06-03 | Discharge: 2019-06-03 | Disposition: A | Discharge status: Home / Self Care | Payer: Medicare Other | Source: Ambulatory Visit | Attending: Internal Medicine | Admitting: Internal Medicine

## 2019-06-03 DIAGNOSIS — Z20828 Contact with and (suspected) exposure to other viral communicable diseases: Secondary | ICD-10-CM | POA: Diagnosis not present

## 2019-06-03 DIAGNOSIS — Z01812 Encounter for preprocedural laboratory examination: Secondary | ICD-10-CM | POA: Diagnosis not present

## 2019-06-04 LAB — NOVEL CORONAVIRUS, NAA (HOSP ORDER, SEND-OUT TO REF LAB; TAT 18-24 HRS): SARS-CoV-2, NAA: NOT DETECTED

## 2019-06-06 ENCOUNTER — Ambulatory Visit (HOSPITAL_BASED_OUTPATIENT_CLINIC_OR_DEPARTMENT_OTHER): Payer: Medicare Other | Attending: Internal Medicine | Admitting: Internal Medicine

## 2019-06-06 ENCOUNTER — Other Ambulatory Visit: Payer: Self-pay

## 2019-06-06 VITALS — HR 98 | Ht 72.0 in | Wt 240.0 lb

## 2019-06-06 DIAGNOSIS — G4733 Obstructive sleep apnea (adult) (pediatric): Secondary | ICD-10-CM | POA: Diagnosis not present

## 2019-06-08 ENCOUNTER — Encounter: Payer: Self-pay | Admitting: Cardiology

## 2019-06-08 DIAGNOSIS — S42002D Fracture of unspecified part of left clavicle, subsequent encounter for fracture with routine healing: Secondary | ICD-10-CM | POA: Diagnosis not present

## 2019-06-08 NOTE — Telephone Encounter (Signed)
See note, contact patient to update  Copied from [place] [id number]. Topic: General - Inquiry >> Jun 08, 2019 10:21 AM Brandon [name] wrote: Reason for CRM: Pt called to check if a fax from [facility] was received. The fax was for lab orders. Pt stated that [facility] has sent the orders twice and that he will see if he can get the labs done else where / please advise

## 2019-06-09 ENCOUNTER — Other Ambulatory Visit: Payer: Self-pay

## 2019-06-09 ENCOUNTER — Encounter: Payer: Self-pay | Admitting: Cardiology

## 2019-06-09 ENCOUNTER — Ambulatory Visit (INDEPENDENT_AMBULATORY_CARE_PROVIDER_SITE_OTHER): Payer: Medicare Other | Admitting: Cardiology

## 2019-06-09 VITALS — BP 140/80 | HR 69 | Ht 72.0 in | Wt 244.8 lb

## 2019-06-09 DIAGNOSIS — I25810 Atherosclerosis of coronary artery bypass graft(s) without angina pectoris: Secondary | ICD-10-CM

## 2019-06-09 DIAGNOSIS — Z944 Liver transplant status: Secondary | ICD-10-CM

## 2019-06-09 DIAGNOSIS — D899 Disorder involving the immune mechanism, unspecified: Secondary | ICD-10-CM | POA: Diagnosis not present

## 2019-06-09 NOTE — Patient Instructions (Signed)
Medication Instructions:  The current medical regimen is effective;  continue present plan and medications.  If you need a refill on your cardiac medications before your next appointment, please call your pharmacy.   Testing/Procedures: Your physician has requested that you have an echocardiogram. Echocardiography is a painless test that uses sound waves to create images of your heart. It provides your doctor with information about the size and shape of your heart and how well your heart's chambers and valves are working. This procedure takes approximately one hour. There are no restrictions for this procedure.  Follow-Up: At [facility], you and your health needs are our priority.  As part of our continuing mission to provide you with exceptional heart care, we have created designated Provider Care Teams.  These Care Teams include your primary Cardiologist (physician) and Advanced Practice Providers (APPs -  Physician Assistants and Nurse Practitioners) who all work together to provide you with the care you need, when you need it. You will need a follow up appointment in 12 months.  Please call our office 2 months in advance to schedule this appointment.  You may see Mark Skains, MD or one of the following Advanced Practice Providers on your designated Care Team:   Lori Gerhardt, NP Laura Ingold, NP . Jill McDaniel, NP  Thank you for choosing [facility]!!     
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
## 2019-06-11 DIAGNOSIS — G4733 Obstructive sleep apnea (adult) (pediatric): Secondary | ICD-10-CM

## 2019-06-11 NOTE — Procedures (Signed)
Patient Name: Brandon [name], Brandon [name] Date: 06/06/2019 Gender: Male D.O.B: Jun 29, 1951 Age (years): 29 Referring Provider: Baird Lyons MD, ABSM Height (inches): 72 Interpreting Physician: Baird Lyons MD, ABSM Weight (lbs): 240 RPSGT: Zadie Rhine BMI: 33 MRN: [id number] Neck Size: 16.00  CLINICAL INFORMATION The patient is referred for a CPAP titration to treat sleep apnea.  Date of NPSG, Split Night or HST: HST 03/09/2019   AHI 43.9/ hr,  desaturation to 79%, body weight 244 lbs  SLEEP STUDY TECHNIQUE As per the AASM Manual for the Scoring of Sleep and Associated Events v2.3 (April 2016) with a hypopnea requiring 4% desaturations.  The channels recorded and monitored were frontal, central and occipital EEG, electrooculogram (EOG), submentalis EMG (chin), nasal and oral airflow, thoracic and abdominal wall motion, anterior tibialis EMG, snore microphone, electrocardiogram, and pulse oximetry. Continuous positive airway pressure (CPAP) was initiated at the beginning of the study and titrated to treat sleep-disordered breathing.  MEDICATIONS Medications self-administered by patient taken the night of the study : N/A  TECHNICIAN COMMENTS Comments added by technician: ONE RESTROOM VIST Comments added by scorer: N/A RESPIRATORY PARAMETERS Optimal PAP Pressure (cm): 20 AHI at Optimal Pressure (/hr): 0.0 Overall Minimal O2 (%): 81.0 Supine % at Optimal Pressure (%): 100 Minimal O2 at Optimal Pressure (%): 93.0   SLEEP ARCHITECTURE The study was initiated at 10:35:51 PM and ended at 5:02:23 AM.  Sleep onset time was 3.0 minutes and the sleep efficiency was 83.3%%. The total sleep time was 322 minutes.  The patient spent 6.4%% of the night in stage N1 sleep, 68.5%% in stage N2 sleep, 0.9%% in stage N3 and 24.2% in REM.Stage REM latency was 42.0 minutes  Wake after sleep onset was 61.5. Alpha intrusion was absent. Supine sleep was 77.35%.  CARDIAC DATA The 2 lead EKG  demonstrated sinus rhythm. The mean heart rate was 78.5 beats per minute. Other EKG findings include: PVCs.  LEG MOVEMENT DATA The total Periodic Limb Movements of Sleep (PLMS) were 0. The PLMS index was 0.0. A PLMS index of <15 is considered normal in adults.  IMPRESSIONS - The optimal PAP pressure was 20 cm of water. - Central sleep apnea was not noted during this titration (CAI = 0.4/h). - Moderate oxygen desaturations were observed during this titration (min O2 = 81.0%). Min sat at CPAP 20 was 93%. - No snoring was audible during this study. - 2-lead EKG demonstrated: PVCs - Clinically significant periodic limb movements were not noted during this study. Arousals associated with PLMs were rare.  DIAGNOSIS - Obstructive Sleep Apnea (327.23 [G47.33 ICD-10])  RECOMMENDATIONS - Trial of CPAP therapy on 20 cm H2O or autopap 15-20. - Patient used a Large size Resmed Full Face Mask AirFit F20 mask and heated humidification. - Be careful with alcohol, sedatives and other CNS depressants that may worsen sleep apnea and disrupt normal sleep architecture. - Sleep hygiene should be reviewed to assess factors that may improve sleep quality. - Weight management and regular exercise should be initiated or continued.  [Electronically signed] 06/11/2019 10:57 AM  Baird Lyons MD, ABSM Diplomate, American Board of Sleep Medicine   NPI: [id number]                        [place] [place] [facility]  ELECTRONICALLY SIGNED ON:  06/11/2019, 10:54 AM [place] PH: [phone]   FX: [phone] [place]

## 2019-06-16 ENCOUNTER — Other Ambulatory Visit: Payer: Self-pay

## 2019-06-16 ENCOUNTER — Ambulatory Visit (HOSPITAL_COMMUNITY): Payer: Medicare Other | Attending: Cardiology

## 2019-06-16 DIAGNOSIS — I25810 Atherosclerosis of coronary artery bypass graft(s) without angina pectoris: Secondary | ICD-10-CM | POA: Diagnosis not present

## 2019-06-17 DIAGNOSIS — M799 Soft tissue disorder, unspecified: Secondary | ICD-10-CM | POA: Diagnosis not present

## 2019-06-17 DIAGNOSIS — M6281 Muscle weakness (generalized): Secondary | ICD-10-CM | POA: Diagnosis not present

## 2019-06-17 DIAGNOSIS — M25612 Stiffness of left shoulder, not elsewhere classified: Secondary | ICD-10-CM | POA: Diagnosis not present

## 2019-06-17 DIAGNOSIS — M545 Low back pain: Secondary | ICD-10-CM | POA: Diagnosis not present

## 2019-06-17 DIAGNOSIS — M25512 Pain in left shoulder: Secondary | ICD-10-CM | POA: Diagnosis not present

## 2019-06-17 DIAGNOSIS — M2560 Stiffness of unspecified joint, not elsewhere classified: Secondary | ICD-10-CM | POA: Diagnosis not present

## 2019-06-20 DIAGNOSIS — M25512 Pain in left shoulder: Secondary | ICD-10-CM | POA: Diagnosis not present

## 2019-06-20 DIAGNOSIS — M545 Low back pain: Secondary | ICD-10-CM | POA: Diagnosis not present

## 2019-06-20 DIAGNOSIS — M6281 Muscle weakness (generalized): Secondary | ICD-10-CM | POA: Diagnosis not present

## 2019-06-20 DIAGNOSIS — M2560 Stiffness of unspecified joint, not elsewhere classified: Secondary | ICD-10-CM | POA: Diagnosis not present

## 2019-06-20 DIAGNOSIS — M799 Soft tissue disorder, unspecified: Secondary | ICD-10-CM | POA: Diagnosis not present

## 2019-06-20 DIAGNOSIS — M25612 Stiffness of left shoulder, not elsewhere classified: Secondary | ICD-10-CM | POA: Diagnosis not present

## 2019-06-22 ENCOUNTER — Encounter: Payer: Self-pay | Admitting: Family Medicine

## 2019-06-22 DIAGNOSIS — M6281 Muscle weakness (generalized): Secondary | ICD-10-CM | POA: Diagnosis not present

## 2019-06-22 DIAGNOSIS — M25512 Pain in left shoulder: Secondary | ICD-10-CM | POA: Diagnosis not present

## 2019-06-22 DIAGNOSIS — M25612 Stiffness of left shoulder, not elsewhere classified: Secondary | ICD-10-CM | POA: Diagnosis not present

## 2019-06-22 DIAGNOSIS — M545 Low back pain: Secondary | ICD-10-CM | POA: Diagnosis not present

## 2019-06-22 DIAGNOSIS — M799 Soft tissue disorder, unspecified: Secondary | ICD-10-CM | POA: Diagnosis not present

## 2019-06-22 DIAGNOSIS — M2560 Stiffness of unspecified joint, not elsewhere classified: Secondary | ICD-10-CM | POA: Diagnosis not present

## 2019-06-23 ENCOUNTER — Ambulatory Visit: Payer: Medicare Other | Admitting: Gastroenterology

## 2019-06-23 ENCOUNTER — Other Ambulatory Visit: Payer: Self-pay | Admitting: Internal Medicine

## 2019-06-23 DIAGNOSIS — G4733 Obstructive sleep apnea (adult) (pediatric): Secondary | ICD-10-CM

## 2019-06-28 DIAGNOSIS — M2560 Stiffness of unspecified joint, not elsewhere classified: Secondary | ICD-10-CM | POA: Diagnosis not present

## 2019-06-28 DIAGNOSIS — M25612 Stiffness of left shoulder, not elsewhere classified: Secondary | ICD-10-CM | POA: Diagnosis not present

## 2019-06-28 DIAGNOSIS — M6281 Muscle weakness (generalized): Secondary | ICD-10-CM | POA: Diagnosis not present

## 2019-06-28 DIAGNOSIS — M799 Soft tissue disorder, unspecified: Secondary | ICD-10-CM | POA: Diagnosis not present

## 2019-06-28 DIAGNOSIS — M25512 Pain in left shoulder: Secondary | ICD-10-CM | POA: Diagnosis not present

## 2019-06-28 DIAGNOSIS — M545 Low back pain: Secondary | ICD-10-CM | POA: Diagnosis not present

## 2019-06-30 ENCOUNTER — Telehealth: Payer: Self-pay | Admitting: Internal Medicine

## 2019-06-30 DIAGNOSIS — M6281 Muscle weakness (generalized): Secondary | ICD-10-CM | POA: Diagnosis not present

## 2019-06-30 DIAGNOSIS — M545 Low back pain: Secondary | ICD-10-CM | POA: Diagnosis not present

## 2019-06-30 DIAGNOSIS — M25612 Stiffness of left shoulder, not elsewhere classified: Secondary | ICD-10-CM | POA: Diagnosis not present

## 2019-06-30 DIAGNOSIS — M2560 Stiffness of unspecified joint, not elsewhere classified: Secondary | ICD-10-CM | POA: Diagnosis not present

## 2019-06-30 DIAGNOSIS — M799 Soft tissue disorder, unspecified: Secondary | ICD-10-CM | POA: Diagnosis not present

## 2019-06-30 DIAGNOSIS — M25512 Pain in left shoulder: Secondary | ICD-10-CM | POA: Diagnosis not present

## 2019-06-30 NOTE — Telephone Encounter (Signed)
Community message sent to [facility]

## 2019-06-30 NOTE — Telephone Encounter (Signed)
PCCs, can you help with this? Order for CPAP has been placed to [facility] on 06/23/2019.

## 2019-07-04 NOTE — Telephone Encounter (Signed)
Called and spoke to [facility] they stated they would have [place] call the patient and let him know they have the order

## 2019-07-05 DIAGNOSIS — M2560 Stiffness of unspecified joint, not elsewhere classified: Secondary | ICD-10-CM | POA: Diagnosis not present

## 2019-07-05 DIAGNOSIS — M25612 Stiffness of left shoulder, not elsewhere classified: Secondary | ICD-10-CM | POA: Diagnosis not present

## 2019-07-05 DIAGNOSIS — M799 Soft tissue disorder, unspecified: Secondary | ICD-10-CM | POA: Diagnosis not present

## 2019-07-05 DIAGNOSIS — M545 Low back pain: Secondary | ICD-10-CM | POA: Diagnosis not present

## 2019-07-05 DIAGNOSIS — M25512 Pain in left shoulder: Secondary | ICD-10-CM | POA: Diagnosis not present

## 2019-07-05 DIAGNOSIS — M6281 Muscle weakness (generalized): Secondary | ICD-10-CM | POA: Diagnosis not present

## 2019-07-06 DIAGNOSIS — S42002D Fracture of unspecified part of left clavicle, subsequent encounter for fracture with routine healing: Secondary | ICD-10-CM | POA: Diagnosis not present

## 2019-07-07 DIAGNOSIS — M6281 Muscle weakness (generalized): Secondary | ICD-10-CM | POA: Diagnosis not present

## 2019-07-07 DIAGNOSIS — M25612 Stiffness of left shoulder, not elsewhere classified: Secondary | ICD-10-CM | POA: Diagnosis not present

## 2019-07-07 DIAGNOSIS — M2560 Stiffness of unspecified joint, not elsewhere classified: Secondary | ICD-10-CM | POA: Diagnosis not present

## 2019-07-07 DIAGNOSIS — M545 Low back pain: Secondary | ICD-10-CM | POA: Diagnosis not present

## 2019-07-07 DIAGNOSIS — M25512 Pain in left shoulder: Secondary | ICD-10-CM | POA: Diagnosis not present

## 2019-07-07 DIAGNOSIS — M799 Soft tissue disorder, unspecified: Secondary | ICD-10-CM | POA: Diagnosis not present

## 2019-07-14 ENCOUNTER — Ambulatory Visit: Payer: Medicare Other | Admitting: Internal Medicine

## 2019-07-15 ENCOUNTER — Ambulatory Visit: Payer: Medicare Other | Admitting: Primary Care

## 2019-07-15 DIAGNOSIS — M799 Soft tissue disorder, unspecified: Secondary | ICD-10-CM | POA: Diagnosis not present

## 2019-07-15 DIAGNOSIS — M25512 Pain in left shoulder: Secondary | ICD-10-CM | POA: Diagnosis not present

## 2019-07-15 DIAGNOSIS — M545 Low back pain: Secondary | ICD-10-CM | POA: Diagnosis not present

## 2019-07-15 DIAGNOSIS — M6281 Muscle weakness (generalized): Secondary | ICD-10-CM | POA: Diagnosis not present

## 2019-07-15 DIAGNOSIS — M25612 Stiffness of left shoulder, not elsewhere classified: Secondary | ICD-10-CM | POA: Diagnosis not present

## 2019-07-15 DIAGNOSIS — M2560 Stiffness of unspecified joint, not elsewhere classified: Secondary | ICD-10-CM | POA: Diagnosis not present

## 2019-07-15 NOTE — Telephone Encounter (Signed)
Spoke with the pt and have scheduled appt with Beth for 07/18/19

## 2019-07-15 NOTE — Telephone Encounter (Signed)
Ok to set up next week with an APP. He may need to consider a fitted oral appliance, or be referred to sleep center for CPAP desensitization.
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
## 2019-07-18 ENCOUNTER — Other Ambulatory Visit: Payer: Self-pay

## 2019-07-18 ENCOUNTER — Encounter: Payer: Self-pay | Admitting: Primary Care

## 2019-07-18 ENCOUNTER — Ambulatory Visit (INDEPENDENT_AMBULATORY_CARE_PROVIDER_SITE_OTHER): Payer: Medicare Other | Admitting: Primary Care

## 2019-07-18 VITALS — BP 138/80 | HR 72 | Temp 97.5°F | Ht 69.69 in | Wt 250.0 lb

## 2019-07-18 DIAGNOSIS — M2560 Stiffness of unspecified joint, not elsewhere classified: Secondary | ICD-10-CM | POA: Diagnosis not present

## 2019-07-18 DIAGNOSIS — G4733 Obstructive sleep apnea (adult) (pediatric): Secondary | ICD-10-CM | POA: Diagnosis not present

## 2019-07-18 DIAGNOSIS — M545 Low back pain: Secondary | ICD-10-CM | POA: Diagnosis not present

## 2019-07-18 DIAGNOSIS — M799 Soft tissue disorder, unspecified: Secondary | ICD-10-CM | POA: Diagnosis not present

## 2019-07-18 DIAGNOSIS — M25512 Pain in left shoulder: Secondary | ICD-10-CM | POA: Diagnosis not present

## 2019-07-18 DIAGNOSIS — M25612 Stiffness of left shoulder, not elsewhere classified: Secondary | ICD-10-CM | POA: Diagnosis not present

## 2019-07-18 DIAGNOSIS — M6281 Muscle weakness (generalized): Secondary | ICD-10-CM | POA: Diagnosis not present

## 2019-07-18 NOTE — Patient Instructions (Signed)
Pleasure meeting you today Brandon [name]  Recommendations: We will refer you to Dr. Redmond Baseman with [facility]  Avoid sleeping on your back, try side sleeping position Continue to work on weight loss   Referral: Dr. Melida Quitter with ENT- evaluation for OSA, possible inspire device   Follow-up: Dr. Annamaria Boots as needed only   Sleep Apnea Sleep apnea is a condition in which breathing pauses or becomes shallow during sleep. Episodes of sleep apnea usually last 10 seconds or longer, and they may occur as many as 20 times an hour. Sleep apnea disrupts your sleep and keeps your body from getting the rest that it needs. This condition can increase your risk of certain health problems, including:  Heart attack.  Stroke.  Obesity.  Diabetes.  Heart failure.  Irregular heartbeat. What are the causes? There are three kinds of sleep apnea:  Obstructive sleep apnea. This kind is caused by a blocked or collapsed airway.  Central sleep apnea. This kind happens when the part of the brain that controls breathing does not send the correct signals to the muscles that control breathing.  Mixed sleep apnea. This is a combination of obstructive and central sleep apnea. The most common cause of this condition is a collapsed or blocked airway. An airway can collapse or become blocked if:  Your throat muscles are abnormally relaxed.  Your tongue and tonsils are larger than normal.  You are overweight.  Your airway is smaller than normal. What increases the risk? You are more likely to develop this condition if you:  Are overweight.  Smoke.  Have a smaller than normal airway.  Are elderly.  Are male.  Drink alcohol.  Take sedatives or tranquilizers.  Have a family history of sleep apnea. What are the signs or symptoms? Symptoms of this condition include:  Trouble staying asleep.  Daytime sleepiness and tiredness.  Irritability.  Loud snoring.  Morning headaches.   Trouble concentrating.  Forgetfulness.  Decreased interest in sex.  Unexplained sleepiness.  Mood swings.  Personality changes.  Feelings of depression.  Waking up often during the night to urinate.  Dry mouth.  Sore throat. How is this diagnosed? This condition may be diagnosed with:  A medical history.  A physical exam.  A series of tests that are done while you are sleeping (sleep study). These tests are usually done in a sleep lab, but they may also be done at home. How is this treated? Treatment for this condition aims to restore normal breathing and to ease symptoms during sleep. It may involve managing health issues that can affect breathing, such as high blood pressure or obesity. Treatment may include:  Sleeping on your side.  Using a decongestant if you have nasal congestion.  Avoiding the use of depressants, including alcohol, sedatives, and narcotics.  Losing weight if you are overweight.  Making changes to your diet.  Quitting smoking.  Using a device to open your airway while you sleep, such as: ? An oral appliance. This is a custom-made mouthpiece that shifts your lower jaw forward. ? A continuous positive airway pressure (CPAP) device. This device blows air through a mask when you breathe out (exhale). ? A nasal expiratory positive airway pressure (EPAP) device. This device has valves that you put into each nostril. ? A bi-level positive airway pressure (BPAP) device. This device blows air through a mask when you breathe in (inhale) and breathe out (exhale).  Having surgery if other treatments do not work. During surgery,  excess tissue is removed to create a wider airway. It is important to get treatment for sleep apnea. Without treatment, this condition can lead to:  High blood pressure.  Coronary artery disease.  In men, an inability to achieve or maintain an erection (impotence).  Reduced thinking abilities. Follow these instructions at  home: Lifestyle  Make any lifestyle changes that your health care provider recommends.  Eat a healthy, well-balanced diet.  Take steps to lose weight if you are overweight.  Avoid using depressants, including alcohol, sedatives, and narcotics.  Do not use any products that contain nicotine or tobacco, such as cigarettes, e-cigarettes, and chewing tobacco. If you need help quitting, ask your health care provider. General instructions  Take over-the-counter and prescription medicines only as told by your health care provider.  If you were given a device to open your airway while you sleep, use it only as told by your health care provider.  If you are having surgery, make sure to tell your health care provider you have sleep apnea. You may need to bring your device with you.  Keep all follow-up visits as told by your health care provider. This is important. Contact a health care provider if:  The device that you received to open your airway during sleep is uncomfortable or does not seem to be working.  Your symptoms do not improve.  Your symptoms get worse. Get help right away if:  You develop: ? Chest pain. ? Shortness of breath. ? Discomfort in your back, arms, or stomach.  You have: ? Trouble speaking. ? Weakness on one side of your body. ? Drooping in your face. These symptoms may represent a serious problem that is an emergency. Do not wait to see if the symptoms will go away. Get medical help right away. Call your local emergency services (911 in the [place] Do not drive yourself to the hospital. Summary  Sleep apnea is a condition in which breathing pauses or becomes shallow during sleep.  The most common cause is a collapsed or blocked airway.  The goal of treatment is to restore normal breathing and to ease symptoms during sleep. This information is not intended to replace advice given to you by your health care provider. Make sure you discuss any questions you  have with your health care provider. Document Released: 08/15/2002 Document Revised: 06/11/2018 Document Reviewed: 04/20/2018 Elsevier Patient Education  2020 [employer].

## 2019-07-18 NOTE — Progress Notes (Signed)
@Patient  ID: Brandon [name], male    DOB: 09-20-1950, 68 y.o.   MRN: [id number]  Chief Complaint  Patient presents with  . Follow-up    CPAP issues (doesn't have, considering options)    Referring provider: [name], MD  HPI: 68 year old male, former smoker. PMH significant for OSA, CAD s/p CABG, liver transplant. Patient of Dr. Annamaria Boots, last seen on 01/13/19. HST [place], [place]) in April 2019 showed severe sleep apnea, AHI 58/hr. He did not tolerate CPAP in the past, recommended new DME for CPAP auto titrate 5-20 with mask of choice. He has lost 20 lbs since his sleep study in [place].   07/18/2019 Patient presents today for OSA follow-up. Needed to repeat new sleep study per [facility] medical supply. HST showed AHI 43/hr. CPAP machine was delivered to his house, however, he never used it and returned machine requesting in-lab sleep study first. Completed CPAP titration study in September. Optimal pressure 20cm H20. States that he was very uncomfortable during test and is unsure if he would be able to tolerate CPAP. He was approved for inspire in [place]. Found out he was moving so he decided not to get device at that time.    Allergies  Allergen Reactions  . Nsaids Other (See Comments)    Liver transplant 09/13/2015     Immunization History  Administered Date(s) Administered  . Fluad Quad(high Dose 65+) 05/19/2019  . Influenza, High Dose Seasonal PF 06/01/2015, 07/16/2018  . Influenza, Quadrivalent, Recombinant, Inj, Pf 05/21/2017  . Influenza, Seasonal, Injecte, Preservative Fre 08/10/2014, 05/09/2016, 06/28/2017  . Influenza,inj,Quad PF,6+ Mos 06/04/2016  . Influenza,inj,quad, With Preservative 05/09/2016  . Pneumococcal Conjugate-13 08/03/2017  . Pneumococcal Polysaccharide-23 09/10/2018    Past Medical History:  Diagnosis Date  . BPH (benign prostatic hyperplasia)   . Cancer [facility])    Hepatic  . Coronary artery disease   . Hyperlipidemia   . Hypertension   . Liver failure  ([place])    Cirrhosis- alcoholic- Liver transplant 2017  . Pneumonia    as a kid    Tobacco History: Social History   Tobacco Use  Smoking Status Former Smoker  . Packs/day: 1.00  . Years: 25.00  . Pack years: 25.00  . Types: Cigarettes  . Quit date: 01/12/1998  . Years since quitting: 21.5  Smokeless Tobacco Never Used   Counseling given: Not Answered   Outpatient Medications Prior to Visit  Medication Sig Dispense Refill  . aspirin EC 81 MG tablet Take 81 mg by mouth daily.    . calcium gluconate 500 MG tablet Take 1 tablet by mouth 2 (two) times daily.     . Everolimus (ZORTRESS) 0.75 MG TABS Take 0.75 mg by mouth 2 (two) times daily.     . furosemide (LASIX) 40 MG tablet Take 1 tablet (40 mg total) by mouth daily. 90 tablet 1  . Multiple Vitamin (MULTIVITAMIN WITH MINERALS) TABS tablet Take 1 tablet by mouth daily.    . Mycophenolate Sodium (MYCOPHENOLIC ACID PO) Take [id number] mg by mouth 2 (two) times daily.    . rosuvastatin (CRESTOR) 5 MG tablet Take 1 tablet (5 mg total) by mouth daily. 90 tablet 3  . tadalafil (CIALIS) 10 MG tablet Take 1 tablet (10 mg total) by mouth daily as needed for erectile dysfunction. 30 tablet 5   No facility-administered medications prior to visit.    Review of Systems  Review of Systems  Constitutional: Negative.   Respiratory: Negative.   Cardiovascular: Negative.  Musculoskeletal: Positive for back pain.   Physical Exam  BP 138/80 (BP Location: Right Arm, Cuff Size: Normal)   Pulse 72   Temp (!) 97.5 F (36.4 C) (Temporal)   Ht 5' 9.69" (1.77 m)   Wt 250 lb (113.4 kg)   SpO2 98%   BMI 36.20 kg/m  Physical Exam Constitutional:      General: He is not in acute distress.    Appearance: Normal appearance. He is not ill-appearing.  HENT:     Head: Normocephalic and atraumatic.     Mouth/Throat:     Mouth: Mucous membranes are moist.     Pharynx: Oropharynx is clear.     Comments: Mallampati class I-II Cardiovascular:     Rate  and Rhythm: Normal rate and regular rhythm.  Pulmonary:     Effort: Pulmonary effort is normal.     Breath sounds: Normal breath sounds.  Musculoskeletal: Normal range of motion.  Skin:    General: Skin is warm and dry.  Neurological:     General: No focal deficit present.     Mental Status: He is alert and oriented to person, place, and time. Mental status is at baseline.  Psychiatric:        Mood and Affect: Mood normal.        Behavior: Behavior normal.        Thought Content: Thought content normal.        Judgment: Judgment normal.      Lab Results:  CBC    Component Value Date/Time   WBC 7.2 04/29/2019 0725   RBC 5.10 04/29/2019 0725   HGB 14.8 04/29/2019 0725   HGB 12.9 (L) 08/30/2009 1533   HCT 45.1 04/29/2019 0725   HCT 38.5 08/30/2009 1533   PLT 212 04/29/2019 0725   PLT 240 08/30/2009 1533   MCV 88.4 04/29/2019 0725   MCV 94.9 08/30/2009 1533   MCH 29.0 04/29/2019 0725   MCHC 32.8 04/29/2019 0725   RDW 12.6 04/29/2019 0725   RDW 14.3 08/30/2009 1533   LYMPHSABS 1.6 04/29/2019 0725   LYMPHSABS 2.1 08/30/2009 1533   MONOABS 0.6 04/29/2019 0725   MONOABS 0.9 08/30/2009 1533   EOSABS 0.2 04/29/2019 0725   EOSABS 1.1 (H) 08/30/2009 1533   BASOSABS 0.1 04/29/2019 0725   BASOSABS 0.1 08/30/2009 1533    BMET    Component Value Date/Time   NA 138 04/29/2019 0725   NA 139 02/17/2019   K 3.9 04/29/2019 0725   CL 104 04/29/2019 0725   CO2 23 04/29/2019 0725   GLUCOSE 97 04/29/2019 0725   BUN 18 04/29/2019 0725   BUN 16 02/17/2019   CREATININE 1.11 04/29/2019 0725   CALCIUM 9.3 04/29/2019 0725   GFRNONAA >60 04/29/2019 0725   GFRAA >60 04/29/2019 0725    BNP No results found for: BNP  ProBNP No results found for: PROBNP  Imaging: No results found.   Assessment & Plan:   OSA (obstructive sleep apnea) - HST showed AHI 43/hr - CPAP intolerant - Interested in [place] weight loss and side sleeping position - Refer to Dr.  Melida Quitter with ENT    Martyn Ehrich, NP 07/18/2019

## 2019-07-18 NOTE — Assessment & Plan Note (Addendum)
-   HST showed AHI 43/hr - CPAP intolerant - Interested in [place] weight loss and side sleeping position - Refer to Dr. Melida Quitter with ENT

## 2019-07-20 DIAGNOSIS — D899 Disorder involving the immune mechanism, unspecified: Secondary | ICD-10-CM | POA: Diagnosis not present

## 2019-07-20 DIAGNOSIS — Z944 Liver transplant status: Secondary | ICD-10-CM | POA: Diagnosis not present

## 2019-07-21 ENCOUNTER — Encounter: Payer: Self-pay | Admitting: Gastroenterology

## 2019-07-21 ENCOUNTER — Ambulatory Visit (INDEPENDENT_AMBULATORY_CARE_PROVIDER_SITE_OTHER): Payer: Medicare Other | Admitting: Gastroenterology

## 2019-07-21 VITALS — BP 130/70 | HR 68 | Temp 97.4°F | Ht 72.0 in | Wt 249.8 lb

## 2019-07-21 DIAGNOSIS — Z1159 Encounter for screening for other viral diseases: Secondary | ICD-10-CM

## 2019-07-21 DIAGNOSIS — Z86010 Personal history of colonic polyps: Secondary | ICD-10-CM | POA: Diagnosis not present

## 2019-07-21 MED ORDER — NA SULFATE-K SULFATE-MG SULF 17.5-3.13-1.6 GM/177ML PO SOLN: 1.0000 | ORAL | 0 refills | Status: DC

## 2019-07-21 NOTE — Patient Instructions (Addendum)
I was a pleasure to meet you today. Thank you for bringing the results from your colonoscopy in  2017.  It is definitely time to schedule another colonoscopy.   Tips for colonoscopy:  - Stay well hydrated for 3-4 days prior to the exam. This reduces nausea and dehydration.  - To prevent skin/hemorrhoid irritation - prior to wiping, put A&D ointment or vaseline on the toilet paper. - Keep a towel or pad on the bed.  - Drink  64oz of clear liquids in the morning of prep day (prior to starting the prep) to be sure that there is enough fluid to flush the colon and stay hydrated!!!! This is in addition to the fluids required for preparation. - Use of a flavored hard candy, such as grape Anise Salvo, can counteract some of the flavor of the prep and may prevent some nausea.   Please call with any questions or concerns prior to your procedure.

## 2019-07-21 NOTE — Progress Notes (Addendum)
Referring Provider: [name], MD Primary Care Physician:  [name], MD  Reason for Consultation:  History of colon polyps   IMPRESSION:  History of colon polyps    -09/02/2016: 9 polyps, a combination of tubular adenoma, sessile serrated adenoma, and hyperplastic polyps External hemorrhoids on colonoscopy 2017 Liver transplant 2017 - followed at [facility] No family history of colon cancer or polyps  He is due surveillance colonoscopy given his history of tubular adenoma and sessile serrated adenoma.  No contraindications to proceeding with colonoscopy at this time.   PLAN: Colonoscopy  The nature of the procedure, as well as the risks, benefits, and alternatives were carefully and thoroughly reviewed with the patient. Ample time for discussion and questions allowed. The patient understood, was satisfied, and agreed to proceed.  Please see the "Patient Instructions" section for addition details about the plan.  HPI: Brandon [name] is a 68 y.o. male referred by the transplant program at [facility] for a screening colonoscopy.  The history is obtained to the patient, review of his electronic health record, and review of care everywhere.  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 He has OSA on CPAP, CAD s/p CABG x 4. He underwent a deceased donor liver transplant in January 2017 at [facility] for alcohol-related cirrhosis and hepatocellular carcinoma.  He is managed on Zortress 0.75 mg twice daily and mycophenolate 725 mg twice daily. Having labs regularly through [place].   He has a personal history of colon polyps. No associated symptoms. Uses Metamucil daily following his transplant. There is no dysphagia, odynophagia, regurgitation,  heartburn, nausea, abdominal pain, change in bowel habits, melena, hematochezia, or bright red blood per rectum. There is no anorexia or recent change in weight.   I reviewed records from [facility].  Mr.  Gloris Manchester had a colonoscopy at age 53 for a personal history of colon polyps with Dr. Kenton Kingfisher 09/02/2016.  This revealed 1 1 to 2 mm polyp in the ascending colon, 3 2-3 polyps in the sigmoid colon, 3 2 to 3 mm polyps in the rectosigmoid colon, 2 1 to 2 mm polyps in the rectum, and external hemorrhoids.  Biopsy results showed unremarkable colonic mucosa, benign lymphoid aggregate, tubular adenoma, sessile serrated adenoma, and hyperplastic polyps.  Brother with prostate cancer. No known family history of colon cancer or polyps. No family history of uterine/endometrial cancer, pancreatic cancer or gastric/stomach cancer.   Past Medical History:  Diagnosis Date  . BPH (benign prostatic hyperplasia)   . Cancer [facility])    Hepatic  . Coronary artery disease   . Hyperlipidemia   . Hypertension   . Liver failure ([place])    Cirrhosis- alcoholic- Liver transplant 2017  . Pneumonia    as a kid    Past Surgical History:  Procedure Laterality Date  . COLONOSCOPY W/ POLYPECTOMY    . CORONARY ARTERY BYPASS GRAFT  07/2009  . LIVER TRANSPLANT  2017  . ORIF CLAVICULAR FRACTURE Left 04/29/2019   Procedure: OPEN REDUCTION INTERNAL FIXATION (ORIF) CLAVICULAR FRACTURE;  Surgeon: Altamese Antietam, MD;  Location: [place];  Service: Orthopedics;  Laterality: Left;    Current Outpatient Medications  Medication Sig Dispense Refill  . aspirin EC 81 MG tablet Take 81 mg by mouth daily.    . calcium gluconate 500 MG tablet Take 1 tablet by mouth 2 (two) times daily.     . Everolimus (ZORTRESS) 0.75 MG TABS Take 0.75 mg by mouth 2 (two) times daily.     [name] Kitchen  furosemide (LASIX) 40 MG tablet Take 1 tablet (40 mg total) by mouth daily. 90 tablet 1  . Multiple Vitamin (MULTIVITAMIN WITH MINERALS) TABS tablet Take 1 tablet by mouth daily.    . Mycophenolate Sodium (MYCOPHENOLIC ACID PO) Take [id number] mg by mouth 2 (two) times daily.    . rosuvastatin (CRESTOR) 5 MG tablet Take 1 tablet (5 mg total) by mouth daily. 90 tablet 3  .  tadalafil (CIALIS) 10 MG tablet Take 1 tablet (10 mg total) by mouth daily as needed for erectile dysfunction. 30 tablet 5   No current facility-administered medications for this visit.     Allergies as of 07/21/2019 - Review Complete 07/18/2019  Allergen Reaction Noted  . Nsaids Other (See Comments) 09/02/2016    Family History  Problem Relation Age of Onset  . Heart disease Sister   . Heart disease Brother   . Prostate cancer Brother   . Hyperlipidemia Mother   . Hyperlipidemia Father     Social History   Socioeconomic History  . Marital status: Married    Spouse name: Not on file  . Number of children: Not on file  . Years of education: Not on file  . Highest education level: Not on file  Occupational History  . Not on file  Social Needs  . Financial resource strain: Not on file  . Food insecurity    Worry: Not on file    Inability: Not on file  . Transportation needs    Medical: Not on file    Non-medical: Not on file  Tobacco Use  . Smoking status: Former Smoker    Packs/day: 1.00    Years: 25.00    Pack years: 25.00    Types: Cigarettes    Quit date: 01/12/1998    Years since quitting: 21.5  . Smokeless tobacco: Never Used  Substance and Sexual Activity  . Alcohol use: Not Currently    Comment: none 2017  . Drug use: Never  . Sexual activity: Yes  Lifestyle  . Physical activity    Days per week: Not on file    Minutes per session: Not on file  . Stress: Not on file  Relationships  . Social Herbalist on phone: Not on file    Gets together: Not on file    Attends religious service: Not on file    Active member of club or organization: Not on file    Attends meetings of clubs or organizations: Not on file    Relationship status: Not on file  . Intimate partner violence    Fear of current or ex partner: Not on file    Emotionally abused: Not on file    Physically abused: Not on file    Forced sexual activity: Not on file  Other Topics  Concern  . Not on file  Social History Narrative  . Not on file    Review of Systems: 12 system ROS is negative except as noted above.   Physical Exam: General:   Alert,  well-nourished, pleasant and cooperative in NAD Head:  Normocephalic and atraumatic. Eyes:  Sclera clear, no icterus.   Conjunctiva pink. Ears:  Normal auditory acuity. Nose:  No deformity, discharge,  or lesions. Mouth:  No deformity or lesions.   Neck:  Supple; no masses or thyromegaly. Lungs:  Clear throughout to auscultation.   No wheezes. Heart:  Regular rate and rhythm; no murmurs. Abdomen:  Soft, well healed Chevron scar with a hernia  at the midline portion, central obesity, nontender, nondistended, normal bowel sounds, no rebound or guarding. No hepatosplenomegaly.   Rectal:  Deferred  Msk:  Symmetrical. No boney deformities LAD: No inguinal or umbilical LAD Extremities:  No clubbing or edema. Neurologic:  Alert and  oriented x4;  grossly nonfocal Skin:  Intact without significant lesions or rashes. Psych:  Alert and cooperative. Normal mood and affect.    Avamarie Crossley L. Tarri Glenn, MD, MPH 07/21/2019, 8:23 AM

## 2019-07-22 DIAGNOSIS — M25512 Pain in left shoulder: Secondary | ICD-10-CM | POA: Diagnosis not present

## 2019-07-22 DIAGNOSIS — M2560 Stiffness of unspecified joint, not elsewhere classified: Secondary | ICD-10-CM | POA: Diagnosis not present

## 2019-07-22 DIAGNOSIS — M25612 Stiffness of left shoulder, not elsewhere classified: Secondary | ICD-10-CM | POA: Diagnosis not present

## 2019-07-22 DIAGNOSIS — M6281 Muscle weakness (generalized): Secondary | ICD-10-CM | POA: Diagnosis not present

## 2019-07-22 DIAGNOSIS — M545 Low back pain: Secondary | ICD-10-CM | POA: Diagnosis not present

## 2019-07-22 DIAGNOSIS — M799 Soft tissue disorder, unspecified: Secondary | ICD-10-CM | POA: Diagnosis not present

## 2019-07-25 DIAGNOSIS — M25512 Pain in left shoulder: Secondary | ICD-10-CM | POA: Diagnosis not present

## 2019-07-25 DIAGNOSIS — M545 Low back pain: Secondary | ICD-10-CM | POA: Diagnosis not present

## 2019-07-25 DIAGNOSIS — M6281 Muscle weakness (generalized): Secondary | ICD-10-CM | POA: Diagnosis not present

## 2019-07-25 DIAGNOSIS — M25612 Stiffness of left shoulder, not elsewhere classified: Secondary | ICD-10-CM | POA: Diagnosis not present

## 2019-07-25 DIAGNOSIS — M799 Soft tissue disorder, unspecified: Secondary | ICD-10-CM | POA: Diagnosis not present

## 2019-07-25 DIAGNOSIS — M2560 Stiffness of unspecified joint, not elsewhere classified: Secondary | ICD-10-CM | POA: Diagnosis not present

## 2019-07-27 ENCOUNTER — Ambulatory Visit (INDEPENDENT_AMBULATORY_CARE_PROVIDER_SITE_OTHER): Payer: Medicare Other

## 2019-07-27 DIAGNOSIS — Z1159 Encounter for screening for other viral diseases: Secondary | ICD-10-CM

## 2019-07-28 DIAGNOSIS — M545 Low back pain: Secondary | ICD-10-CM | POA: Diagnosis not present

## 2019-07-28 DIAGNOSIS — M2560 Stiffness of unspecified joint, not elsewhere classified: Secondary | ICD-10-CM | POA: Diagnosis not present

## 2019-07-28 DIAGNOSIS — M799 Soft tissue disorder, unspecified: Secondary | ICD-10-CM | POA: Diagnosis not present

## 2019-07-28 DIAGNOSIS — M25512 Pain in left shoulder: Secondary | ICD-10-CM | POA: Diagnosis not present

## 2019-07-28 DIAGNOSIS — M25612 Stiffness of left shoulder, not elsewhere classified: Secondary | ICD-10-CM | POA: Diagnosis not present

## 2019-07-28 DIAGNOSIS — M6281 Muscle weakness (generalized): Secondary | ICD-10-CM | POA: Diagnosis not present

## 2019-07-28 LAB — SARS CORONAVIRUS 2 (TAT 6-24 HRS): SARS Coronavirus 2: NEGATIVE

## 2019-07-29 ENCOUNTER — Encounter: Payer: Self-pay | Admitting: Gastroenterology

## 2019-07-29 ENCOUNTER — Ambulatory Visit (AMBULATORY_SURGERY_CENTER): Payer: Medicare Other | Admitting: Gastroenterology

## 2019-07-29 ENCOUNTER — Other Ambulatory Visit: Payer: Self-pay

## 2019-07-29 VITALS — BP 127/83 | HR 76 | Temp 97.9°F | Resp 20 | Ht 72.0 in | Wt 249.0 lb

## 2019-07-29 DIAGNOSIS — K635 Polyp of colon: Secondary | ICD-10-CM | POA: Diagnosis not present

## 2019-07-29 DIAGNOSIS — Z86010 Personal history of colonic polyps: Secondary | ICD-10-CM | POA: Diagnosis not present

## 2019-07-29 DIAGNOSIS — D124 Benign neoplasm of descending colon: Secondary | ICD-10-CM

## 2019-07-29 MED ORDER — SODIUM CHLORIDE 0.9 % IV SOLN: 500.0000 mL | Freq: Once | INTRAVENOUS | Status: DC

## 2019-07-29 NOTE — Progress Notes (Signed)
Pt's states no medical or surgical changes since previsit or office visit.  CW vitals, KWeiss IV and JB temps.

## 2019-07-29 NOTE — Progress Notes (Signed)
Called to room to assist during endoscopic procedure.  Patient ID and intended procedure confirmed with present staff. Received instructions for my participation in the procedure from the performing physician.  

## 2019-07-29 NOTE — Patient Instructions (Signed)
One polyp removed today, diverticulosis and non-bleeding internal hemorrhoids noted. Please eat a high fiber diet except for first meal. Await pathology (you'll get a letter in the mail in 1-3 weeks from now). Repeat colonoscopy in 5 years.     OU HAD AN ENDOSCOPIC PROCEDURE TODAY AT [facility]:   Refer to the procedure report that was given to you for any specific questions about what was found during the examination.  If the procedure report does not answer your questions, please call your gastroenterologist to clarify.  If you requested that your care partner not be given the details of your procedure findings, then the procedure report has been included in a sealed envelope for you to review at your convenience later.  YOU SHOULD EXPECT: Some feelings of bloating in the abdomen. Passage of more gas than usual.  Walking can help get rid of the air that was put into your GI tract during the procedure and reduce the bloating. If you had a lower endoscopy (such as a colonoscopy or flexible sigmoidoscopy) you may notice spotting of blood in your stool or on the toilet paper. If you underwent a bowel prep for your procedure, you may not have a normal bowel movement for a few days.  Please Note:  You might notice some irritation and congestion in your nose or some drainage.  This is from the oxygen used during your procedure.  There is no need for concern and it should clear up in a day or so.  SYMPTOMS TO REPORT IMMEDIATELY:   Following lower endoscopy (colonoscopy or flexible sigmoidoscopy):  Excessive amounts of blood in the stool  Significant tenderness or worsening of abdominal pains  Swelling of the abdomen that is new, acute  Fever of 100F or higher   For urgent or emergent issues, a gastroenterologist can be reached at any hour by calling [phone].   DIET:  We do recommend a small meal at first, but then you may proceed to your regular diet.  Drink plenty of  fluids but you should avoid alcoholic beverages for 24 hours.  ACTIVITY:  You should plan to take it easy for the rest of today and you should NOT DRIVE or use heavy machinery until tomorrow (because of the sedation medicines used during the test).    FOLLOW UP: Our staff will call the number listed on your records 48-72 hours following your procedure to check on you and address any questions or concerns that you may have regarding the information given to you following your procedure. If we do not reach you, we will leave a message.  We will attempt to reach you two times.  During this call, we will ask if you have developed any symptoms of COVID 19. If you develop any symptoms (ie: fever, flu-like symptoms, shortness of breath, cough etc.) before then, please call [phone].  If you test positive for Covid 19 in the 2 weeks post procedure, please call and report this information to [name].    If any biopsies were taken you will be contacted by phone or by letter within the next 1-3 weeks.  Please call us at [phone] if you have not heard about the biopsies in 3 weeks.    SIGNATURES/CONFIDENTIALITY: You and/or your care partner have signed paperwork which will be entered into your electronic medical record.  These signatures attest to the fact that that the information above on your After Visit Summary has been reviewed and is understood.  Full responsibility of the confidentiality of this discharge information lies with you and/or your care-partner.

## 2019-07-29 NOTE — Op Note (Signed)
[place] Patient Name: Brandon [name] Procedure Date: 07/29/2019 8:20 AM MRN: [id number] Endoscopist: Thornton Park MD, MD Age: 68 Referring MD:  Date of Birth: [birth year] Gender: Male Account #: [id number] Procedure:                Colonoscopy Indications:              Surveillance: Personal history of adenomatous                            polyps on last colonoscopy 3 years ago                           History of colon polyps                           -09/02/2016: 9 polyps, a combination of tubular                            adenoma, sessile serrated adenoma, and hyperplastic                            polyps                           External hemorrhoids on colonoscopy 2017                           Liver transplant 2017 - followed at [facility]                           No family history of colon cancer or polypsHistory                            of colon polyps                           - Medicines:                Monitored Anesthesia Care Procedure:                Pre-Anesthesia Assessment:                           - Prior to the procedure, a History and Physical                            was performed, and patient medications and                            allergies were reviewed. The patient's tolerance of                            previous anesthesia was also reviewed. The risks                            and benefits of the procedure and the sedation  options and risks were discussed with the patient.                            All questions were answered, and informed consent                            was obtained. Prior Anticoagulants: The patient has                            taken no previous anticoagulant or antiplatelet                            agents. ASA Grade Assessment: III - A patient with                            severe systemic disease. After reviewing the risks                            and benefits, the patient  was deemed in                            satisfactory condition to undergo the procedure.                           After obtaining informed consent, the colonoscope                            was passed under direct vision. Throughout the                            procedure, the patient's blood pressure, pulse, and                            oxygen saturations were monitored continuously. The                            Colonoscope was introduced through the anus and                            advanced to the the terminal ileum, with                            identification of the appendiceal orifice and IC                            valve. A second forward view of the right colon was                            performed. The colonoscopy was performed without                            difficulty. The patient tolerated the procedure  well. The quality of the bowel preparation was                            good. The terminal ileum, ileocecal valve,                            appendiceal orifice, and rectum were photographed. Scope In: 8:24:11 AM Scope Out: 8:42:18 AM Scope Withdrawal Time: 0 hours 14 minutes 34 seconds  Total Procedure Duration: 0 hours 18 minutes 7 seconds  Findings:                 The perianal and digital rectal examinations were                            normal.                           A 2 mm polyp was found in the descending colon. The                            polyp was sessile. The polyp was removed with a                            cold snare. Resection and retrieval were complete.                            Estimated blood loss was minimal.                           Many small and large-mouthed diverticula were found                            in the entire colon.                           Non-bleeding internal hemorrhoids were found.                           The exam was otherwise without abnormality on                             direct and retroflexion views. Complications:            No immediate complications. Estimated blood loss:                            Minimal. Estimated Blood Loss:     Estimated blood loss was minimal. Impression:               - One 2 mm polyp in the descending colon, removed                            with a cold snare. Resected and retrieved.                           - Diverticulosis in the  entire examined colon.                           - Non-bleeding internal hemorrhoids.                           - The examination was otherwise normal on direct                            and retroflexion views. Recommendation:           - Patient has a contact number available for                            emergencies. The signs and symptoms of potential                            delayed complications were discussed with the                            patient. Return to normal activities tomorrow.                            Written discharge instructions were provided to the                            patient.                           - High fiber diet.                           - Continue present medications.                           - Await pathology results.                           - Repeat colonoscopy in 5 years for surveillance                            given the personal history of colon polyps. Thornton Park MD, MD 07/29/2019 8:50:17 AM This report has been signed electronically.

## 2019-07-29 NOTE — Progress Notes (Signed)
A/ox3, pleased with MAC, report to RN 

## 2019-07-31 DIAGNOSIS — M799 Soft tissue disorder, unspecified: Secondary | ICD-10-CM | POA: Diagnosis not present

## 2019-07-31 DIAGNOSIS — M25512 Pain in left shoulder: Secondary | ICD-10-CM | POA: Diagnosis not present

## 2019-07-31 DIAGNOSIS — M2560 Stiffness of unspecified joint, not elsewhere classified: Secondary | ICD-10-CM | POA: Diagnosis not present

## 2019-07-31 DIAGNOSIS — M25612 Stiffness of left shoulder, not elsewhere classified: Secondary | ICD-10-CM | POA: Diagnosis not present

## 2019-07-31 DIAGNOSIS — M545 Low back pain: Secondary | ICD-10-CM | POA: Diagnosis not present

## 2019-07-31 DIAGNOSIS — M6281 Muscle weakness (generalized): Secondary | ICD-10-CM | POA: Diagnosis not present

## 2019-08-02 ENCOUNTER — Telehealth: Payer: Self-pay

## 2019-08-02 NOTE — Telephone Encounter (Signed)
  Follow up Call-  Call back number 07/29/2019  Post procedure Call Back phone  # [phone]  Permission to leave phone message Yes  Some recent data might be hidden     Patient questions:  Do you have a fever, pain , or abdominal swelling? No. Pain Score  0 *  Have you tolerated food without any problems? Yes.    Have you been able to return to your normal activities? Yes.    Do you have any questions about your discharge instructions: Diet   No. Medications  No. Follow up visit  No.  Do you have questions or concerns about your Care? No.  Actions: * If pain score is 4 or above: No action needed, pain <4. 1. Have you developed a fever since your procedure? no  2.   Have you had an respiratory symptoms (SOB or cough) since your procedure? no  3.   Have you tested positive for COVID 19 since your procedure no  4.   Have you had any family members/close contacts diagnosed with the COVID 19 since your procedure?  no   If yes to any of these questions please route to Joylene John, RN and Alphonsa Gin, Therapist, sports.

## 2019-08-03 DIAGNOSIS — M545 Low back pain: Secondary | ICD-10-CM | POA: Diagnosis not present

## 2019-08-03 DIAGNOSIS — M25512 Pain in left shoulder: Secondary | ICD-10-CM | POA: Diagnosis not present

## 2019-08-03 DIAGNOSIS — M799 Soft tissue disorder, unspecified: Secondary | ICD-10-CM | POA: Diagnosis not present

## 2019-08-03 DIAGNOSIS — M2560 Stiffness of unspecified joint, not elsewhere classified: Secondary | ICD-10-CM | POA: Diagnosis not present

## 2019-08-03 DIAGNOSIS — M25612 Stiffness of left shoulder, not elsewhere classified: Secondary | ICD-10-CM | POA: Diagnosis not present

## 2019-08-03 DIAGNOSIS — M6281 Muscle weakness (generalized): Secondary | ICD-10-CM | POA: Diagnosis not present

## 2019-08-08 ENCOUNTER — Other Ambulatory Visit: Payer: Self-pay

## 2019-08-08 DIAGNOSIS — M2560 Stiffness of unspecified joint, not elsewhere classified: Secondary | ICD-10-CM | POA: Diagnosis not present

## 2019-08-08 DIAGNOSIS — M25612 Stiffness of left shoulder, not elsewhere classified: Secondary | ICD-10-CM | POA: Diagnosis not present

## 2019-08-08 DIAGNOSIS — M799 Soft tissue disorder, unspecified: Secondary | ICD-10-CM | POA: Diagnosis not present

## 2019-08-08 DIAGNOSIS — M6281 Muscle weakness (generalized): Secondary | ICD-10-CM | POA: Diagnosis not present

## 2019-08-08 DIAGNOSIS — M545 Low back pain: Secondary | ICD-10-CM | POA: Diagnosis not present

## 2019-08-08 DIAGNOSIS — M25512 Pain in left shoulder: Secondary | ICD-10-CM | POA: Diagnosis not present

## 2019-08-09 ENCOUNTER — Other Ambulatory Visit: Payer: Self-pay | Admitting: Family Medicine

## 2019-08-09 ENCOUNTER — Encounter: Payer: Self-pay | Admitting: Family Medicine

## 2019-08-09 ENCOUNTER — Other Ambulatory Visit (INDEPENDENT_AMBULATORY_CARE_PROVIDER_SITE_OTHER): Payer: Medicare Other

## 2019-08-09 ENCOUNTER — Ambulatory Visit (INDEPENDENT_AMBULATORY_CARE_PROVIDER_SITE_OTHER): Payer: Medicare Other

## 2019-08-09 ENCOUNTER — Ambulatory Visit (INDEPENDENT_AMBULATORY_CARE_PROVIDER_SITE_OTHER): Payer: Medicare Other | Admitting: Family Medicine

## 2019-08-09 VITALS — BP 124/78 | HR 72 | Temp 98.7°F | Ht 72.0 in | Wt 247.0 lb

## 2019-08-09 DIAGNOSIS — I25810 Atherosclerosis of coronary artery bypass graft(s) without angina pectoris: Secondary | ICD-10-CM

## 2019-08-09 DIAGNOSIS — Z944 Liver transplant status: Secondary | ICD-10-CM

## 2019-08-09 DIAGNOSIS — N50811 Right testicular pain: Secondary | ICD-10-CM | POA: Diagnosis not present

## 2019-08-09 DIAGNOSIS — E785 Hyperlipidemia, unspecified: Secondary | ICD-10-CM | POA: Diagnosis not present

## 2019-08-09 DIAGNOSIS — R21 Rash and other nonspecific skin eruption: Secondary | ICD-10-CM | POA: Insufficient documentation

## 2019-08-09 DIAGNOSIS — G4733 Obstructive sleep apnea (adult) (pediatric): Secondary | ICD-10-CM

## 2019-08-09 DIAGNOSIS — N3943 Post-void dribbling: Secondary | ICD-10-CM

## 2019-08-09 DIAGNOSIS — N50812 Left testicular pain: Secondary | ICD-10-CM | POA: Diagnosis not present

## 2019-08-09 DIAGNOSIS — M25512 Pain in left shoulder: Secondary | ICD-10-CM

## 2019-08-09 DIAGNOSIS — Z125 Encounter for screening for malignant neoplasm of prostate: Secondary | ICD-10-CM

## 2019-08-09 DIAGNOSIS — R600 Localized edema: Secondary | ICD-10-CM

## 2019-08-09 DIAGNOSIS — N401 Enlarged prostate with lower urinary tract symptoms: Secondary | ICD-10-CM

## 2019-08-09 DIAGNOSIS — N529 Male erectile dysfunction, unspecified: Secondary | ICD-10-CM | POA: Diagnosis not present

## 2019-08-09 DIAGNOSIS — G8929 Other chronic pain: Secondary | ICD-10-CM

## 2019-08-09 LAB — COMPREHENSIVE METABOLIC PANEL
ALT: 25 U/L (ref 0–53)
AST: 23 U/L (ref 0–37)
Albumin: 4.2 g/dL (ref 3.5–5.2)
Alkaline Phosphatase: 96 U/L (ref 39–117)
BUN: 18 mg/dL (ref 6–23)
CO2: 27 mEq/L (ref 19–32)
Calcium: 9.9 mg/dL (ref 8.4–10.5)
Chloride: 102 mEq/L (ref 96–112)
Creatinine, Ser: 1.2 mg/dL (ref 0.40–1.50)
GFR: 60.28 mL/min (ref >60.00)
Glucose, Bld: 95 mg/dL (ref 70–99)
Potassium: 4.2 mEq/L (ref 3.5–5.1)
Sodium: 138 mEq/L (ref 135–145)
Total Bilirubin: 0.8 mg/dL (ref 0.2–1.2)
Total Protein: 6.7 g/dL (ref 6.0–8.3)

## 2019-08-09 LAB — CBC
HCT: 45.6 % (ref 39.0–52.0)
Hemoglobin: 15.5 g/dL (ref 13.0–17.0)
MCHC: 34 g/dL (ref 30.0–36.0)
MCV: 83.9 fl (ref 78.0–100.0)
Platelets: 207 10*3/uL (ref 150.0–400.0)
RBC: 5.44 Mil/uL (ref 4.22–5.81)
RDW: 13.9 % (ref 11.5–15.5)
WBC: 6.5 10*3/uL (ref 4.0–10.5)

## 2019-08-09 LAB — PSA, MEDICARE: PSA: 2.26 ng/ml (ref 0.10–4.00)

## 2019-08-09 MED ORDER — TADALAFIL 20 MG PO TABS: 20.0000 mg | ORAL_TABLET | Freq: Every day | ORAL | 5 refills | Status: DC | PRN

## 2019-08-09 NOTE — Patient Instructions (Addendum)
Mr. Brandon [name] , Thank you for taking time to come for your Medicare Wellness Visit. I appreciate your ongoing commitment to your health goals. Please review the following plan we discussed and let me know if I can assist you in the future.   Screening recommendations/referrals: Colorectal Screening: up to date; colonoscopy 07/29/19 with Dr. Tarri Glenn   Vision and Dental Exams: Recommended annual ophthalmology exams for early detection of glaucoma and other disorders of the eye Recommended annual dental exams for proper oral hygiene  Vaccinations: Influenza vaccine: completed 05/29/19 Pneumococcal vaccine: up to date; last 09/10/18 Tdap vaccine: booster recommended; last 06/21/09  Shingles vaccine: Please call your insurance company to determine your out of pocket expense for the Shingrix vaccine. You may receive this vaccine at your local pharmacy. (Discuss with Liver Specialist)   Advanced directives: Please bring a copy of your POA (Power of Attorney) and/or Living Will to your next appointment.  Goals: Recommend to drink at least 6-8 8oz glasses of water per day and consume a balanced diet rich in fresh fruits and vegetables.   Next appointment: Please schedule your Annual Wellness Visit with your Nurse Health Advisor in one year.  Preventive Care 62 Years and Older, Male Preventive care refers to lifestyle choices and visits with your health care provider that can promote health and wellness. What does preventive care include?  A yearly physical exam. This is also called an annual well check.  Dental exams once or twice a year.  Routine eye exams. Ask your health care provider how often you should have your eyes checked.  Personal lifestyle choices, including:  Daily care of your teeth and gums.  Regular physical activity.  Eating a healthy diet.  Avoiding tobacco and drug use.  Limiting alcohol use.  Practicing safe sex.  Taking low doses of aspirin every day if  recommended by your health care provider..  Taking vitamin and mineral supplements as recommended by your health care provider. What happens during an annual well check? The services and screenings done by your health care provider during your annual well check will depend on your age, overall health, lifestyle risk factors, and family history of disease. Counseling  Your health care provider may ask you questions about your:  Alcohol use.  Tobacco use.  Drug use.  Emotional well-being.  Home and relationship well-being.  Sexual activity.  Eating habits.  History of falls.  Memory and ability to understand (cognition).  Work and work Statistician. Screening  You may have the following tests or measurements:  Height, weight, and BMI.  Blood pressure.  Lipid and cholesterol levels. These may be checked every 5 years, or more frequently if you are over 70 years old.  Skin check.  Lung cancer screening. You may have this screening every year starting at age 16 if you have a 30-pack-year history of smoking and currently smoke or have quit within the past 15 years.  Fecal occult blood test (FOBT) of the stool. You may have this test every year starting at age 40.  Flexible sigmoidoscopy or colonoscopy. You may have a sigmoidoscopy every 5 years or a colonoscopy every 10 years starting at age 43.  Prostate cancer screening. Recommendations will vary depending on your family history and other risks.  Hepatitis C blood test.  Hepatitis B blood test.  Sexually transmitted disease (STD) testing.  Diabetes screening. This is done by checking your blood sugar (glucose) after you have not eaten for a while (fasting). You may have this  done every 1-3 years.  Abdominal aortic aneurysm (AAA) screening. You may need this if you are a current or former smoker.  Osteoporosis. You may be screened starting at age 7 if you are at high risk. Talk with your health care provider about  your test results, treatment options, and if necessary, the need for more tests. Vaccines  Your health care provider may recommend certain vaccines, such as:  Influenza vaccine. This is recommended every year.  Tetanus, diphtheria, and acellular pertussis (Tdap, Td) vaccine. You may need a Td booster every 10 years.  Zoster vaccine. You may need this after age 63.  Pneumococcal 13-valent conjugate (PCV13) vaccine. One dose is recommended after age 68.  Pneumococcal polysaccharide (PPSV23) vaccine. One dose is recommended after age 52. Talk to your health care provider about which screenings and vaccines you need and how often you need them. This information is not intended to replace advice given to you by your health care provider. Make sure you discuss any questions you have with your health care provider. Document Released: 09/21/2015 Document Revised: 05/14/2016 Document Reviewed: 06/26/2015 Elsevier Interactive Patient Education  2017 [place] Prevention in the Home Falls can cause injuries. They can happen to people of all ages. There are many things you can do to make your home safe and to help prevent falls. What can I do on the outside of my home?  Regularly fix the edges of walkways and driveways and fix any cracks.  Remove anything that might make you trip as you walk through a door, such as a raised step or threshold.  Trim any bushes or trees on the path to your home.  Use bright outdoor lighting.  Clear any walking paths of anything that might make someone trip, such as rocks or tools.  Regularly check to see if handrails are loose or broken. Make sure that both sides of any steps have handrails.  Any raised decks and porches should have guardrails on the edges.  Have any leaves, snow, or ice cleared regularly.  Use sand or salt on walking paths during winter.  Clean up any spills in your garage right away. This includes oil or grease spills. What  can I do in the bathroom?  Use night lights.  Install grab bars by the toilet and in the tub and shower. Do not use towel bars as grab bars.  Use non-skid mats or decals in the tub or shower.  If you need to sit down in the shower, use a plastic, non-slip stool.  Keep the floor dry. Clean up any water that spills on the floor as soon as it happens.  Remove soap buildup in the tub or shower regularly.  Attach bath mats securely with double-sided non-slip rug tape.  Do not have throw rugs and other things on the floor that can make you trip. What can I do in the bedroom?  Use night lights.  Make sure that you have a light by your bed that is easy to reach.  Do not use any sheets or blankets that are too big for your bed. They should not hang down onto the floor.  Have a firm chair that has side arms. You can use this for support while you get dressed.  Do not have throw rugs and other things on the floor that can make you trip. What can I do in the kitchen?  Clean up any spills right away.  Avoid walking on wet floors.  Keep items that you use a lot in easy-to-reach places.  If you need to reach something above you, use a strong step stool that has a grab bar.  Keep electrical cords out of the way.  Do not use floor polish or wax that makes floors slippery. If you must use wax, use non-skid floor wax.  Do not have throw rugs and other things on the floor that can make you trip. What can I do with my stairs?  Do not leave any items on the stairs.  Make sure that there are handrails on both sides of the stairs and use them. Fix handrails that are broken or loose. Make sure that handrails are as long as the stairways.  Check any carpeting to make sure that it is firmly attached to the stairs. Fix any carpet that is loose or worn.  Avoid having throw rugs at the top or bottom of the stairs. If you do have throw rugs, attach them to the floor with carpet tape.  Make sure  that you have a light switch at the top of the stairs and the bottom of the stairs. If you do not have them, ask someone to add them for you. What else can I do to help prevent falls?  Wear shoes that:  Do not have high heels.  Have rubber bottoms.  Are comfortable and fit you well.  Are closed at the toe. Do not wear sandals.  If you use a stepladder:  Make sure that it is fully opened. Do not climb a closed stepladder.  Make sure that both sides of the stepladder are locked into place.  Ask someone to hold it for you, if possible.  Clearly mark and make sure that you can see:  Any grab bars or handrails.  First and last steps.  Where the edge of each step is.  Use tools that help you move around (mobility aids) if they are needed. These include:  Canes.  Walkers.  Scooters.  Crutches.  Turn on the lights when you go into a dark area. Replace any light bulbs as soon as they burn out.  Set up your furniture so you have a clear path. Avoid moving your furniture around.  If any of your floors are uneven, fix them.  If there are any pets around you, be aware of where they are.  Review your medicines with your doctor. Some medicines can make you feel dizzy. This can increase your chance of falling. Ask your doctor what other things that you can do to help prevent falls. This information is not intended to replace advice given to you by your health care provider. Make sure you discuss any questions you have with your health care provider. Document Released: 06/21/2009 Document Revised: 01/31/2016 Document Reviewed: 09/29/2014 Elsevier Interactive Patient Education  2017 [employer].

## 2019-08-09 NOTE — Assessment & Plan Note (Signed)
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  He will continue Lasix 40 mg daily.  Discussed starting spironolactone however patient declined.  Recommended conservative measures including leg elevation, frequent activity, salt avoidance, and compression stockings.

## 2019-08-09 NOTE — Assessment & Plan Note (Signed)
Will increase Cialis to 20 mg daily.  May need a urology referral if continues to have issues.

## 2019-08-09 NOTE — Assessment & Plan Note (Signed)
Continue aspirin and statin. 

## 2019-08-09 NOTE — Assessment & Plan Note (Signed)
Followed by dermatology.  Recommended cortisone cream to areas of irritation.

## 2019-08-09 NOTE — Assessment & Plan Note (Signed)
Increase Cialis to 20 mg daily.

## 2019-08-09 NOTE — Patient Instructions (Signed)
It was very nice to see you today!  Please try using voltaren for any aches or pains.  Please keep your legs elevated, watch your salt, and use compression stockings. Let me know if you want to try another fluid pill  We will increase your Cialis to 20 mg daily.  We will check PSA today to screen for prostate cancer.  Please use cortisone cream for the rash on your scalp.  Let me know if you would like referral to see a different sleep specialist.  Come back in 6 to 12 months, or sooner if needed.  Take care, Dr Jerline Pain  Please try these tips to maintain a healthy lifestyle:   Eat at least 3 REAL meals and 1-2 snacks per day.  Aim for no more than 5 hours between eating.  If you eat breakfast, please do so within one hour of getting up.    Obtain twice as many fruits/vegetables as protein or carbohydrate foods for both lunch and dinner. (Half of each meal should be fruits/vegetables, one quarter protein, and one quarter starchy carbs)   Cut down on sweet beverages. This includes juice, soda, and sweet tea.    Exercise at least 150 minutes every week.

## 2019-08-09 NOTE — Assessment & Plan Note (Signed)
Continue management per sleep medicine.  Has referral to ENT pending.
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
## 2019-08-09 NOTE — Assessment & Plan Note (Signed)
-

## 2019-08-11 DIAGNOSIS — M545 Low back pain: Secondary | ICD-10-CM | POA: Diagnosis not present

## 2019-08-11 DIAGNOSIS — M2560 Stiffness of unspecified joint, not elsewhere classified: Secondary | ICD-10-CM | POA: Diagnosis not present

## 2019-08-11 DIAGNOSIS — M6281 Muscle weakness (generalized): Secondary | ICD-10-CM | POA: Diagnosis not present

## 2019-08-11 DIAGNOSIS — M25612 Stiffness of left shoulder, not elsewhere classified: Secondary | ICD-10-CM | POA: Diagnosis not present

## 2019-08-11 DIAGNOSIS — M25512 Pain in left shoulder: Secondary | ICD-10-CM | POA: Diagnosis not present

## 2019-08-11 DIAGNOSIS — G4733 Obstructive sleep apnea (adult) (pediatric): Secondary | ICD-10-CM | POA: Diagnosis not present

## 2019-08-11 DIAGNOSIS — M799 Soft tissue disorder, unspecified: Secondary | ICD-10-CM | POA: Diagnosis not present

## 2019-08-12 LAB — EVEROLIMUS: Everolimus: 4.9 ng/mL (ref 3.0–8.0)

## 2019-08-12 NOTE — Progress Notes (Signed)
Please inform patient of the following:  All of his blood work is NORMAL.  Brandon [name]. Jerline Pain, MD 08/12/2019 7:52 AM

## 2019-08-15 ENCOUNTER — Encounter: Payer: Self-pay | Admitting: Gastroenterology

## 2019-08-15 DIAGNOSIS — M6281 Muscle weakness (generalized): Secondary | ICD-10-CM | POA: Diagnosis not present

## 2019-08-15 DIAGNOSIS — M545 Low back pain: Secondary | ICD-10-CM | POA: Diagnosis not present

## 2019-08-15 DIAGNOSIS — M2560 Stiffness of unspecified joint, not elsewhere classified: Secondary | ICD-10-CM | POA: Diagnosis not present

## 2019-08-15 DIAGNOSIS — M25512 Pain in left shoulder: Secondary | ICD-10-CM | POA: Diagnosis not present

## 2019-08-15 DIAGNOSIS — M25612 Stiffness of left shoulder, not elsewhere classified: Secondary | ICD-10-CM | POA: Diagnosis not present

## 2019-08-15 DIAGNOSIS — M799 Soft tissue disorder, unspecified: Secondary | ICD-10-CM | POA: Diagnosis not present

## 2019-08-25 ENCOUNTER — Encounter: Payer: Self-pay | Admitting: Family Medicine

## 2019-09-07 ENCOUNTER — Other Ambulatory Visit: Payer: Self-pay | Admitting: Family Medicine

## 2019-09-12 ENCOUNTER — Other Ambulatory Visit: Payer: Self-pay | Admitting: Family Medicine

## 2019-09-12 DIAGNOSIS — M545 Low back pain: Secondary | ICD-10-CM | POA: Diagnosis not present

## 2019-09-12 DIAGNOSIS — M2560 Stiffness of unspecified joint, not elsewhere classified: Secondary | ICD-10-CM | POA: Diagnosis not present

## 2019-09-12 DIAGNOSIS — M6281 Muscle weakness (generalized): Secondary | ICD-10-CM | POA: Diagnosis not present

## 2019-09-12 DIAGNOSIS — M25612 Stiffness of left shoulder, not elsewhere classified: Secondary | ICD-10-CM | POA: Diagnosis not present

## 2019-09-12 DIAGNOSIS — M25512 Pain in left shoulder: Secondary | ICD-10-CM | POA: Diagnosis not present

## 2019-09-12 DIAGNOSIS — M799 Soft tissue disorder, unspecified: Secondary | ICD-10-CM | POA: Diagnosis not present

## 2019-09-14 ENCOUNTER — Other Ambulatory Visit: Payer: Self-pay | Admitting: Family Medicine

## 2019-09-14 ENCOUNTER — Telehealth: Payer: Self-pay | Admitting: Family Medicine

## 2019-09-14 NOTE — Telephone Encounter (Signed)
Rx sent 

## 2019-09-14 NOTE — Telephone Encounter (Signed)
Pt called stating that Brandon [name] has not received his med refill request. Please advise.

## 2019-09-15 DIAGNOSIS — G4733 Obstructive sleep apnea (adult) (pediatric): Secondary | ICD-10-CM | POA: Diagnosis not present

## 2019-09-16 DIAGNOSIS — M6281 Muscle weakness (generalized): Secondary | ICD-10-CM | POA: Diagnosis not present

## 2019-09-16 DIAGNOSIS — M545 Low back pain: Secondary | ICD-10-CM | POA: Diagnosis not present

## 2019-09-16 DIAGNOSIS — M2560 Stiffness of unspecified joint, not elsewhere classified: Secondary | ICD-10-CM | POA: Diagnosis not present

## 2019-09-16 DIAGNOSIS — M25612 Stiffness of left shoulder, not elsewhere classified: Secondary | ICD-10-CM | POA: Diagnosis not present

## 2019-09-16 DIAGNOSIS — M25512 Pain in left shoulder: Secondary | ICD-10-CM | POA: Diagnosis not present

## 2019-09-16 DIAGNOSIS — M799 Soft tissue disorder, unspecified: Secondary | ICD-10-CM | POA: Diagnosis not present

## 2019-09-23 DIAGNOSIS — M799 Soft tissue disorder, unspecified: Secondary | ICD-10-CM | POA: Diagnosis not present

## 2019-09-23 DIAGNOSIS — M25612 Stiffness of left shoulder, not elsewhere classified: Secondary | ICD-10-CM | POA: Diagnosis not present

## 2019-09-23 DIAGNOSIS — M6281 Muscle weakness (generalized): Secondary | ICD-10-CM | POA: Diagnosis not present

## 2019-09-23 DIAGNOSIS — M2560 Stiffness of unspecified joint, not elsewhere classified: Secondary | ICD-10-CM | POA: Diagnosis not present

## 2019-09-23 DIAGNOSIS — M25512 Pain in left shoulder: Secondary | ICD-10-CM | POA: Diagnosis not present

## 2019-09-23 DIAGNOSIS — M545 Low back pain: Secondary | ICD-10-CM | POA: Diagnosis not present

## 2019-09-26 DIAGNOSIS — M25612 Stiffness of left shoulder, not elsewhere classified: Secondary | ICD-10-CM | POA: Diagnosis not present

## 2019-09-26 DIAGNOSIS — M2560 Stiffness of unspecified joint, not elsewhere classified: Secondary | ICD-10-CM | POA: Diagnosis not present

## 2019-09-26 DIAGNOSIS — M25512 Pain in left shoulder: Secondary | ICD-10-CM | POA: Diagnosis not present

## 2019-09-26 DIAGNOSIS — M545 Low back pain: Secondary | ICD-10-CM | POA: Diagnosis not present

## 2019-09-26 DIAGNOSIS — M799 Soft tissue disorder, unspecified: Secondary | ICD-10-CM | POA: Diagnosis not present

## 2019-09-26 DIAGNOSIS — M6281 Muscle weakness (generalized): Secondary | ICD-10-CM | POA: Diagnosis not present

## 2019-09-30 DIAGNOSIS — M799 Soft tissue disorder, unspecified: Secondary | ICD-10-CM | POA: Diagnosis not present

## 2019-09-30 DIAGNOSIS — M6281 Muscle weakness (generalized): Secondary | ICD-10-CM | POA: Diagnosis not present

## 2019-09-30 DIAGNOSIS — M25612 Stiffness of left shoulder, not elsewhere classified: Secondary | ICD-10-CM | POA: Diagnosis not present

## 2019-09-30 DIAGNOSIS — M545 Low back pain: Secondary | ICD-10-CM | POA: Diagnosis not present

## 2019-09-30 DIAGNOSIS — M2560 Stiffness of unspecified joint, not elsewhere classified: Secondary | ICD-10-CM | POA: Diagnosis not present

## 2019-09-30 DIAGNOSIS — M25512 Pain in left shoulder: Secondary | ICD-10-CM | POA: Diagnosis not present

## 2019-10-03 DIAGNOSIS — M6281 Muscle weakness (generalized): Secondary | ICD-10-CM | POA: Diagnosis not present

## 2019-10-03 DIAGNOSIS — M799 Soft tissue disorder, unspecified: Secondary | ICD-10-CM | POA: Diagnosis not present

## 2019-10-03 DIAGNOSIS — M25612 Stiffness of left shoulder, not elsewhere classified: Secondary | ICD-10-CM | POA: Diagnosis not present

## 2019-10-03 DIAGNOSIS — M25512 Pain in left shoulder: Secondary | ICD-10-CM | POA: Diagnosis not present

## 2019-10-03 DIAGNOSIS — M545 Low back pain: Secondary | ICD-10-CM | POA: Diagnosis not present

## 2019-10-03 DIAGNOSIS — M2560 Stiffness of unspecified joint, not elsewhere classified: Secondary | ICD-10-CM | POA: Diagnosis not present

## 2019-10-04 DIAGNOSIS — Z944 Liver transplant status: Secondary | ICD-10-CM | POA: Diagnosis not present

## 2019-10-04 DIAGNOSIS — C220 Liver cell carcinoma: Secondary | ICD-10-CM | POA: Diagnosis not present

## 2019-10-07 DIAGNOSIS — M545 Low back pain: Secondary | ICD-10-CM | POA: Diagnosis not present

## 2019-10-07 DIAGNOSIS — M799 Soft tissue disorder, unspecified: Secondary | ICD-10-CM | POA: Diagnosis not present

## 2019-10-07 DIAGNOSIS — M2560 Stiffness of unspecified joint, not elsewhere classified: Secondary | ICD-10-CM | POA: Diagnosis not present

## 2019-10-07 DIAGNOSIS — M25612 Stiffness of left shoulder, not elsewhere classified: Secondary | ICD-10-CM | POA: Diagnosis not present

## 2019-10-07 DIAGNOSIS — M25512 Pain in left shoulder: Secondary | ICD-10-CM | POA: Diagnosis not present

## 2019-10-07 DIAGNOSIS — M6281 Muscle weakness (generalized): Secondary | ICD-10-CM | POA: Diagnosis not present

## 2019-10-09 ENCOUNTER — Ambulatory Visit: Payer: Medicare Other

## 2019-10-10 DIAGNOSIS — M25512 Pain in left shoulder: Secondary | ICD-10-CM | POA: Diagnosis not present

## 2019-10-10 DIAGNOSIS — M799 Soft tissue disorder, unspecified: Secondary | ICD-10-CM | POA: Diagnosis not present

## 2019-10-10 DIAGNOSIS — M2560 Stiffness of unspecified joint, not elsewhere classified: Secondary | ICD-10-CM | POA: Diagnosis not present

## 2019-10-10 DIAGNOSIS — M545 Low back pain: Secondary | ICD-10-CM | POA: Diagnosis not present

## 2019-10-10 DIAGNOSIS — M25612 Stiffness of left shoulder, not elsewhere classified: Secondary | ICD-10-CM | POA: Diagnosis not present

## 2019-10-10 DIAGNOSIS — M6281 Muscle weakness (generalized): Secondary | ICD-10-CM | POA: Diagnosis not present

## 2019-10-14 ENCOUNTER — Ambulatory Visit: Payer: Medicare Other

## 2019-10-14 DIAGNOSIS — M799 Soft tissue disorder, unspecified: Secondary | ICD-10-CM | POA: Diagnosis not present

## 2019-10-14 DIAGNOSIS — M25512 Pain in left shoulder: Secondary | ICD-10-CM | POA: Diagnosis not present

## 2019-10-14 DIAGNOSIS — M545 Low back pain: Secondary | ICD-10-CM | POA: Diagnosis not present

## 2019-10-14 DIAGNOSIS — M6281 Muscle weakness (generalized): Secondary | ICD-10-CM | POA: Diagnosis not present

## 2019-10-14 DIAGNOSIS — M25612 Stiffness of left shoulder, not elsewhere classified: Secondary | ICD-10-CM | POA: Diagnosis not present

## 2019-10-14 DIAGNOSIS — M2560 Stiffness of unspecified joint, not elsewhere classified: Secondary | ICD-10-CM | POA: Diagnosis not present

## 2019-10-16 ENCOUNTER — Ambulatory Visit: Payer: Medicare Other

## 2019-10-19 DIAGNOSIS — S42002D Fracture of unspecified part of left clavicle, subsequent encounter for fracture with routine healing: Secondary | ICD-10-CM | POA: Diagnosis not present

## 2019-11-02 ENCOUNTER — Other Ambulatory Visit: Payer: Self-pay | Admitting: Family Medicine

## 2019-11-04 DIAGNOSIS — H25813 Combined forms of age-related cataract, bilateral: Secondary | ICD-10-CM | POA: Diagnosis not present

## 2019-11-04 DIAGNOSIS — H0102B Squamous blepharitis left eye, upper and lower eyelids: Secondary | ICD-10-CM | POA: Diagnosis not present

## 2019-11-04 DIAGNOSIS — H524 Presbyopia: Secondary | ICD-10-CM | POA: Diagnosis not present

## 2019-11-04 DIAGNOSIS — H0102A Squamous blepharitis right eye, upper and lower eyelids: Secondary | ICD-10-CM | POA: Diagnosis not present

## 2019-11-04 DIAGNOSIS — H5203 Hypermetropia, bilateral: Secondary | ICD-10-CM | POA: Diagnosis not present

## 2019-11-04 DIAGNOSIS — H52223 Regular astigmatism, bilateral: Secondary | ICD-10-CM | POA: Diagnosis not present

## 2019-11-04 DIAGNOSIS — H53002 Unspecified amblyopia, left eye: Secondary | ICD-10-CM | POA: Diagnosis not present

## 2019-11-04 DIAGNOSIS — H40013 Open angle with borderline findings, low risk, bilateral: Secondary | ICD-10-CM | POA: Diagnosis not present

## 2019-12-09 DIAGNOSIS — D849 Immunodeficiency, unspecified: Secondary | ICD-10-CM | POA: Diagnosis not present

## 2019-12-09 DIAGNOSIS — Z944 Liver transplant status: Secondary | ICD-10-CM | POA: Diagnosis not present

## 2019-12-16 ENCOUNTER — Ambulatory Visit (INDEPENDENT_AMBULATORY_CARE_PROVIDER_SITE_OTHER): Payer: Medicare Other

## 2019-12-16 ENCOUNTER — Other Ambulatory Visit: Payer: Self-pay

## 2019-12-16 DIAGNOSIS — Z0000 Encounter for general adult medical examination without abnormal findings: Secondary | ICD-10-CM

## 2019-12-16 NOTE — Progress Notes (Signed)
This visit is being conducted via phone call due to the COVID-19 pandemic. This patient has given me verbal consent via phone to conduct this visit, patient states they are participating from their home address. Some vital signs may be absent or patient reported.   Patient identification: identified by name, DOB, and current address.  Location provider: [facility], Office Persons participating in the virtual visit: Denman George LPN, patient, and Dr. Dimas Chyle  Subjective:   Brandon [name] is a 69 y.o. male who presents for an Initial Medicare Annual Wellness Visit.  Review of Systems   Cardiac Risk Factors include: male gender;dyslipidemia;advanced age (>7men, >36 women)   Objective:    There were no vitals filed for this visit. There is no height or weight on file to calculate BMI.  Advanced Directives 12/16/2019 06/06/2019 04/29/2019  Does Patient Have a Medical Advance Directive? Yes Yes Yes  Type of Advance Directive Living will;Healthcare Power of [place];Living will;Out of facility DNR (pink MOST or yellow form) [place];Living will  Does patient want to make changes to medical advance directive? No - Patient declined - No - Patient declined  Copy of [place] in Chart? No - copy requested No - copy requested No - copy requested    Current Medications (verified) Outpatient Encounter Medications as of 12/16/2019  Medication Sig  . aspirin EC 81 MG tablet Take 81 mg by mouth daily.  . calcium gluconate 500 MG tablet Take 1 tablet by mouth 2 (two) times daily.   . Everolimus (ZORTRESS) 0.75 MG TABS Take 0.75 mg by mouth 2 (two) times daily.   [name] Kitchen Fexofenadine HCl (ALLERGY 24-HR PO) Take 1 tablet by mouth daily.  . furosemide (LASIX) 40 MG tablet TAKE 1 TABLET BY MOUTH EVERY DAY  . Multiple Vitamin (MULTIVITAMIN WITH MINERALS) TABS tablet Take 1 tablet by mouth daily.  . Mycophenolate Sodium (MYCOPHENOLIC ACID  PO) Take [id number] mg by mouth 2 (two) times daily.  . rosuvastatin (CRESTOR) 5 MG tablet Take 1 tablet (5 mg total) by mouth daily.  . tadalafil (CIALIS) 20 MG tablet Take 1 tablet (20 mg total) by mouth daily as needed for erectile dysfunction.  . tadalafil (CIALIS) 10 MG tablet TAKE ONE TABLET BY MOUTH DAILY AS NEEDED FOR ERECTILE DYSFUNCTION (Patient not taking: Reported on 12/16/2019)   No facility-administered encounter medications on file as of 12/16/2019.    Allergies (verified) Nsaids   History: Past Medical History:  Diagnosis Date  . BPH (benign prostatic hyperplasia)   . Cancer [facility])    Hepatic  . Coronary artery disease   . Hyperlipidemia   . Hypertension   . Liver failure ([place])    Cirrhosis- alcoholic- Liver transplant 2017  . Pneumonia    as a kid   Past Surgical History:  Procedure Laterality Date  . COLONOSCOPY W/ POLYPECTOMY    . CORONARY ARTERY BYPASS GRAFT  07/2009  . LIVER TRANSPLANT  2017  . ORIF CLAVICULAR FRACTURE Left 04/29/2019   Procedure: OPEN REDUCTION INTERNAL FIXATION (ORIF) CLAVICULAR FRACTURE;  Surgeon: Altamese Sherrill, MD;  Location: [facility];  Service: Orthopedics;  Laterality: Left;   Family History  Problem Relation Age of Onset  . Heart disease Sister   . Heart disease Brother   . Hyperlipidemia Mother   . Hyperlipidemia Father   . Heart disease Sister   . Liver disease Sister   . Prostate cancer Brother   . Colon cancer Neg Hx   .  Pancreatic cancer Neg Hx   . Stomach cancer Neg Hx   . Esophageal cancer Neg Hx    Social History   Socioeconomic History  . Marital status: Married    Spouse name: Not on file  . Number of children: Not on file  . Years of education: Not on file  . Highest education level: Not on file  Occupational History  . Occupation: Retired   Tobacco Use  . Smoking status: Former Smoker    Packs/day: 1.00    Years: 25.00    Pack years: 25.00    Types: Cigarettes    Quit date: 01/12/1998    Years since quitting: 21.9   . Smokeless tobacco: Never Used  Substance and Sexual Activity  . Alcohol use: Not Currently    Comment: none 2017  . Drug use: Never  . Sexual activity: Yes  Other Topics Concern  . Not on file  Social History Narrative  . Not on file   Social Determinants of Health   Financial Resource Strain:   . Difficulty of Paying Living Expenses:   Food Insecurity:   . Worried About Charity fundraiser in the Last Year:   . Arboriculturist in the Last Year:   Transportation Needs:   . Film/video editor (Medical):   [name] Kitchen Lack of Transportation (Non-Medical):   Physical Activity:   . Days of Exercise per Week:   . Minutes of Exercise per Session:   Stress:   . Feeling of Stress :   Social Connections:   . Frequency of Communication with Friends and Family:   . Frequency of Social Gatherings with Friends and Family:   . Attends Religious Services:   . Active Member of Clubs or Organizations:   . Attends Archivist Meetings:   [name] Kitchen Marital Status:    Tobacco Counseling Counseling given: Not Answered   Clinical Intake:  Pre-visit preparation completed: Yes  Pain : No/denies pain  Diabetes: No  How often do you need to have someone help you when you read instructions, pamphlets, or other written materials from your doctor or pharmacy?: 1 - Never  Interpreter Needed?: No  Information entered by :: Denman George LPN  Activities of Daily Living In your present state of health, do you have any difficulty performing the following activities: 12/16/2019 04/29/2019  Hearing? N N  Vision? N N  Difficulty concentrating or making decisions? N N  Walking or climbing stairs? N N  Dressing or bathing? N N  Doing errands, shopping? N -  Preparing Food and eating ? N -  Using the Toilet? N -  In the past six months, have you accidently leaked urine? N -  Do you have problems with loss of bowel control? N -  Managing your Medications? N -  Managing your Finances? N -    Housekeeping or managing your Housekeeping? N -  Some recent data might be hidden     Immunizations and Health Maintenance Immunization History  Administered Date(s) Administered  . Fluad Quad(high Dose 65+) 05/19/2019  . Influenza, High Dose Seasonal PF 06/01/2015, 07/16/2018  . Influenza, Quadrivalent, Recombinant, Inj, Pf 05/21/2017  . Influenza, Seasonal, Injecte, Preservative Fre 08/10/2014, 05/09/2016, 06/28/2017  . Influenza,inj,Quad PF,6+ Mos 06/04/2016  . Influenza,inj,quad, With Preservative 05/09/2016  . Pneumococcal Conjugate-13 08/03/2017  . Pneumococcal Polysaccharide-23 09/10/2018   There are no preventive care reminders to display for this patient.  Patient Care Team: [name], MD as PCP -  General (Family Medicine) Jerline Pain, MD as PCP - Cardiology (Cardiology) Dorris Fetch Dorna Bloom, MD as Consulting Physician (Gastroenterology)  Indicate any recent Medical Services you may have received from other than [facility] providers in the past year (date may be approximate).    Assessment:   This is a routine wellness examination for Meziah.  Hearing/Vision screen No exam data present  Dietary issues and exercise activities discussed: Current Exercise Habits: Home exercise routine, Type of exercise: walking, Time (Minutes): 30, Frequency (Times/Week): 4, Weekly Exercise (Minutes/Week): 120, Intensity: Mild  Goals   None    Depression Screen PHQ 2/9 Scores 12/16/2019 08/09/2019 07/16/2018  PHQ - 2 Score 0 0 0    Fall Risk Fall Risk  12/16/2019  Falls in the past year? 0  Number falls in past yr: 0  Injury with Fall? 0  Follow up Falls evaluation completed;Education provided;Falls prevention discussed    Is the patient's home free of loose throw rugs in walkways, pet beds, electrical cords, etc?   yes      Grab bars in the bathroom? yes      Handrails on the stairs?   yes      Adequate lighting?   yes   Cognitive Function: no cognitive concerns at this  time    6CIT Screen 12/16/2019  What Year? 0 points  What month? 0 points  What time? 0 points  Count back from 20 0 points  Months in reverse 0 points  Repeat phrase 0 points  Total Score 0    Screening Tests Health Maintenance  Topic Date Due  . TETANUS/TDAP  12/15/2020 (Originally 06/22/2019)  . Hepatitis C Screening  12/15/2020 (Originally 02/14/1951)  . INFLUENZA VACCINE  04/08/2020  . COLONOSCOPY  07/28/2024  . PNA vac Low Risk Adult  Completed    Qualifies for Shingles Vaccine? Discussed and patient will check with pharmacy for coverage.  Patient education handout provided   Cancer Screenings: Lung: Low Dose CT Chest recommended if Age 1-80 years, 30 pack-year currently smoking OR have quit w/in 15years. Patient does not qualify. Colorectal: colonoscopy 07/29/19     Plan:  I have personally reviewed and addressed the Medicare Annual Wellness questionnaire and have noted the following in the patient's chart:  A. Medical and social history B. Use of alcohol, tobacco or illicit drugs  C. Current medications and supplements D. Functional ability and status E.  Nutritional status F.  Physical activity G. Advance directives H. List of other physicians I.  Hospitalizations, surgeries, and ER visits in previous 12 months J.  [place] such as hearing and vision if needed, cognitive and depression L. Referrals, records requested, and appointments- none   In addition, I have reviewed and discussed with patient certain preventive protocols, quality metrics, and best practice recommendations. A written personalized care plan for preventive services as well as general preventive health recommendations were provided to patient.   Signed,  Denman George, LPN  Nurse Health Advisor   Nurse Notes: Patient has completed Covid vaccine Levan Hurst)

## 2019-12-16 NOTE — Patient Instructions (Signed)
Brandon [name] , Thank you for taking time to come for your Medicare Wellness Visit. I appreciate your ongoing commitment to your health goals. Please review the following plan we discussed and let me know if I can assist you in the future.   Screening recommendations/referrals: Colorectal Screening: up to date; last colonoscopy 07/29/19  Vision and Dental Exams: Recommended annual ophthalmology exams for early detection of glaucoma and other disorders of the eye Recommended annual dental exams for proper oral hygiene  Vaccinations: Influenza vaccine: completed 05/29/19 Pneumococcal vaccine: up to date; last 09/10/18 Tdap vaccine: recommended every 10 years; Please call your insurance company to determine your out of pocket expense. You also receive this vaccine at your local pharmacy or Health Dept. Shingles vaccine: You may receive this vaccine at your local pharmacy. (see handout)  Covid vaccine: Completed   Advanced directives: Please bring a copy of your POA (Power of Attorney) and/or Living Will to your next appointment.  Goals: Recommend to drink at least 6-8 8oz glasses of water per day and consume a balanced diet rich in fresh fruits and vegetables.   Next appointment: Please schedule your Annual Wellness Visit with your Nurse Health Advisor in one year.  Preventive Care 35 Years and Older, Male Preventive care refers to lifestyle choices and visits with your health care provider that can promote health and wellness. What does preventive care include?  A yearly physical exam. This is also called an annual well check.  Dental exams once or twice a year.  Routine eye exams. Ask your health care provider how often you should have your eyes checked.  Personal lifestyle choices, including:  Daily care of your teeth and gums.  Regular physical activity.  Eating a healthy diet.  Avoiding tobacco and drug use.  Limiting alcohol use.  Practicing safe sex.  Taking low doses  of aspirin every day if recommended by your health care provider..  Taking vitamin and mineral supplements as recommended by your health care provider. What happens during an annual well check? The services and screenings done by your health care provider during your annual well check will depend on your age, overall health, lifestyle risk factors, and family history of disease. Counseling  Your health care provider may ask you questions about your:  Alcohol use.  Tobacco use.  Drug use.  Emotional well-being.  Home and relationship well-being.  Sexual activity.  Eating habits.  History of falls.  Memory and ability to understand (cognition).  Work and work Statistician. Screening  You may have the following tests or measurements:  Height, weight, and BMI.  Blood pressure.  Lipid and cholesterol levels. These may be checked every 5 years, or more frequently if you are over 36 years old.  Skin check.  Lung cancer screening. You may have this screening every year starting at age 70 if you have a 30-pack-year history of smoking and currently smoke or have quit within the past 15 years.  Fecal occult blood test (FOBT) of the stool. You may have this test every year starting at age 63.  Flexible sigmoidoscopy or colonoscopy. You may have a sigmoidoscopy every 5 years or a colonoscopy every 10 years starting at age 28.  Prostate cancer screening. Recommendations will vary depending on your family history and other risks.  Hepatitis C blood test.  Hepatitis B blood test.  Sexually transmitted disease (STD) testing.  Diabetes screening. This is done by checking your blood sugar (glucose) after you have not eaten for a  while (fasting). You may have this done every 1-3 years.  Abdominal aortic aneurysm (AAA) screening. You may need this if you are a current or former smoker.  Osteoporosis. You may be screened starting at age 43 if you are at high risk. Talk with your  health care provider about your test results, treatment options, and if necessary, the need for more tests. Vaccines  Your health care provider may recommend certain vaccines, such as:  Influenza vaccine. This is recommended every year.  Tetanus, diphtheria, and acellular pertussis (Tdap, Td) vaccine. You may need a Td booster every 10 years.  Zoster vaccine. You may need this after age 77.  Pneumococcal 13-valent conjugate (PCV13) vaccine. One dose is recommended after age 65.  Pneumococcal polysaccharide (PPSV23) vaccine. One dose is recommended after age 82. Talk to your health care provider about which screenings and vaccines you need and how often you need them. This information is not intended to replace advice given to you by your health care provider. Make sure you discuss any questions you have with your health care provider. Document Released: 09/21/2015 Document Revised: 05/14/2016 Document Reviewed: 06/26/2015 Elsevier Interactive Patient Education  2017 [employer] Prevention in the Home Falls can cause injuries. They can happen to people of all ages. There are many things you can do to make your home safe and to help prevent falls. What can I do on the outside of my home?  Regularly fix the edges of walkways and driveways and fix any cracks.  Remove anything that might make you trip as you walk through a door, such as a raised step or threshold.  Trim any bushes or trees on the path to your home.  Use bright outdoor lighting.  Clear any walking paths of anything that might make someone trip, such as rocks or tools.  Regularly check to see if handrails are loose or broken. Make sure that both sides of any steps have handrails.  Any raised decks and porches should have guardrails on the edges.  Have any leaves, snow, or ice cleared regularly.  Use sand or salt on walking paths during winter.  Clean up any spills in your garage right away. This includes oil  or grease spills. What can I do in the bathroom?  Use night lights.  Install grab bars by the toilet and in the tub and shower. Do not use towel bars as grab bars.  Use non-skid mats or decals in the tub or shower.  If you need to sit down in the shower, use a plastic, non-slip stool.  Keep the floor dry. Clean up any water that spills on the floor as soon as it happens.  Remove soap buildup in the tub or shower regularly.  Attach bath mats securely with double-sided non-slip rug tape.  Do not have throw rugs and other things on the floor that can make you trip. What can I do in the bedroom?  Use night lights.  Make sure that you have a light by your bed that is easy to reach.  Do not use any sheets or blankets that are too big for your bed. They should not hang down onto the floor.  Have a firm chair that has side arms. You can use this for support while you get dressed.  Do not have throw rugs and other things on the floor that can make you trip. What can I do in the kitchen?  Clean up any spills right away.  Avoid walking on wet floors.  Keep items that you use a lot in easy-to-reach places.  If you need to reach something above you, use a strong step stool that has a grab bar.  Keep electrical cords out of the way.  Do not use floor polish or wax that makes floors slippery. If you must use wax, use non-skid floor wax.  Do not have throw rugs and other things on the floor that can make you trip. What can I do with my stairs?  Do not leave any items on the stairs.  Make sure that there are handrails on both sides of the stairs and use them. Fix handrails that are broken or loose. Make sure that handrails are as long as the stairways.  Check any carpeting to make sure that it is firmly attached to the stairs. Fix any carpet that is loose or worn.  Avoid having throw rugs at the top or bottom of the stairs. If you do have throw rugs, attach them to the floor with  carpet tape.  Make sure that you have a light switch at the top of the stairs and the bottom of the stairs. If you do not have them, ask someone to add them for you. What else can I do to help prevent falls?  Wear shoes that:  Do not have high heels.  Have rubber bottoms.  Are comfortable and fit you well.  Are closed at the toe. Do not wear sandals.  If you use a stepladder:  Make sure that it is fully opened. Do not climb a closed stepladder.  Make sure that both sides of the stepladder are locked into place.  Ask someone to hold it for you, if possible.  Clearly mark and make sure that you can see:  Any grab bars or handrails.  First and last steps.  Where the edge of each step is.  Use tools that help you move around (mobility aids) if they are needed. These include:  Canes.  Walkers.  Scooters.  Crutches.  Turn on the lights when you go into a dark area. Replace any light bulbs as soon as they burn out.  Set up your furniture so you have a clear path. Avoid moving your furniture around.  If any of your floors are uneven, fix them.  If there are any pets around you, be aware of where they are.  Review your medicines with your doctor. Some medicines can make you feel dizzy. This can increase your chance of falling. Ask your doctor what other things that you can do to help prevent falls. This information is not intended to replace advice given to you by your health care provider. Make sure you discuss any questions you have with your health care provider. Document Released: 06/21/2009 Document Revised: 01/31/2016 Document Reviewed: 09/29/2014 Elsevier Interactive Patient Education  2017 [employer].

## 2020-01-17 DIAGNOSIS — Z20828 Contact with and (suspected) exposure to other viral communicable diseases: Secondary | ICD-10-CM | POA: Diagnosis not present

## 2020-02-18 ENCOUNTER — Other Ambulatory Visit: Payer: Self-pay | Admitting: Family Medicine

## 2020-02-18 ENCOUNTER — Other Ambulatory Visit: Payer: Self-pay | Admitting: Endocrinology

## 2020-02-18 NOTE — Telephone Encounter (Signed)
1.  Please schedule f/u appt 2.  Then please refill x 2 mos, pending that appt.  

## 2020-02-20 NOTE — Telephone Encounter (Signed)
Last fill; 04/06/19  #90/3 Last OV 04/06/19 Next OV 03/20/20

## 2020-02-20 NOTE — Telephone Encounter (Signed)
Rx sent 

## 2020-02-20 NOTE — Telephone Encounter (Signed)
LAST APPOINTMENT DATE: 08/09/2019  NEXT APPOINTMENT DATE: Visit date not found   Rx Cialis  LAST REFILL: 12/13/2019  QTY:  30

## 2020-02-20 NOTE — Telephone Encounter (Signed)
Ok with me. Please place any necessary orders. 

## 2020-03-20 ENCOUNTER — Other Ambulatory Visit: Payer: Self-pay

## 2020-03-20 ENCOUNTER — Ambulatory Visit (INDEPENDENT_AMBULATORY_CARE_PROVIDER_SITE_OTHER): Payer: Medicare Other | Admitting: Endocrinology

## 2020-03-20 VITALS — BP 160/100 | HR 75 | Ht 72.01 in | Wt 246.0 lb

## 2020-03-20 DIAGNOSIS — E785 Hyperlipidemia, unspecified: Secondary | ICD-10-CM

## 2020-03-20 LAB — T4, FREE: Free T4: 0.91 ng/dL (ref 0.60–1.60)

## 2020-03-20 LAB — LIPID PANEL
Cholesterol: 194 mg/dL (ref 0–200)
HDL: 46.6 mg/dL (ref >39.00)
LDL Cholesterol: 117 mg/dL — ABNORMAL HIGH (ref 0–99)
NonHDL: 147.32
Total CHOL/HDL Ratio: 4
Triglycerides: 152 mg/dL — ABNORMAL HIGH (ref 0.0–149.0)
VLDL: 30.4 mg/dL (ref 0.0–40.0)

## 2020-03-20 LAB — TSH: TSH: 3.67 u[IU]/mL (ref 0.35–4.50)

## 2020-03-20 NOTE — Progress Notes (Signed)
Subjective:    Patient ID: Brandon [name], male    DOB: 1951-01-26, 69 y.o.   MRN: [id number]  HPI Pt returns for f/u of dyslipidemia (dx'ed 2010; usually familial combined; he had liver transplant in 4193, for alcoholic cirrhosis; this was after dyslipidemia was dx'ed, so not due to everolimus (however, this med could worsen it) he had CABG in 2009; he has not recently consumed alcohol; he was rx'ed rosuvastatin).  He is fasting today, except for coffee.  Pt says his diet is good.   Past Medical History:  Diagnosis Date  . BPH (benign prostatic hyperplasia)   . Cancer [facility])    Hepatic  . Coronary artery disease   . Hyperlipidemia   . Hypertension   . Liver failure ([place])    Cirrhosis- alcoholic- Liver transplant 2017  . Pneumonia    as a kid    Past Surgical History:  Procedure Laterality Date  . COLONOSCOPY W/ POLYPECTOMY    . CORONARY ARTERY BYPASS GRAFT  07/2009  . LIVER TRANSPLANT  2017  . ORIF CLAVICULAR FRACTURE Left 04/29/2019   Procedure: OPEN REDUCTION INTERNAL FIXATION (ORIF) CLAVICULAR FRACTURE;  Surgeon: Altamese McLean, MD;  Location: [place];  Service: Orthopedics;  Laterality: Left;    Social History   Socioeconomic History  . Marital status: Married    Spouse name: Not on file  . Number of children: Not on file  . Years of education: Not on file  . Highest education level: Not on file  Occupational History  . Occupation: Retired   Tobacco Use  . Smoking status: Former Smoker    Packs/day: 1.00    Years: 25.00    Pack years: 25.00    Types: Cigarettes    Quit date: 01/12/1998    Years since quitting: 22.2  . Smokeless tobacco: Never Used  Vaping Use  . Vaping Use: Never used  Substance and Sexual Activity  . Alcohol use: Not Currently    Comment: none 2017  . Drug use: Never  . Sexual activity: Yes  Other Topics Concern  . Not on file  Social History Narrative  . Not on file   Social Determinants of Health   Financial Resource Strain:   .  Difficulty of Paying Living Expenses:   Food Insecurity:   . Worried About Charity fundraiser in the Last Year:   . Arboriculturist in the Last Year:   Transportation Needs:   . Film/video editor (Medical):   [name] Kitchen Lack of Transportation (Non-Medical):   Physical Activity:   . Days of Exercise per Week:   . Minutes of Exercise per Session:   Stress:   . Feeling of Stress :   Social Connections:   . Frequency of Communication with Friends and Family:   . Frequency of Social Gatherings with Friends and Family:   . Attends Religious Services:   . Active Member of Clubs or Organizations:   . Attends Archivist Meetings:   [name] Kitchen Marital Status:   Intimate Partner Violence:   . Fear of Current or Ex-Partner:   . Emotionally Abused:   [name] Kitchen Physically Abused:   . Sexually Abused:     Current Outpatient Medications on File Prior to Visit  Medication Sig Dispense Refill  . rosuvastatin (CRESTOR) 5 MG tablet TAKE ONE TABLET BY MOUTH DAILY 60 tablet 0  . aspirin EC 81 MG tablet Take 81 mg by mouth daily.    . calcium gluconate 500 MG  tablet Take 1 tablet by mouth 2 (two) times daily.     . Everolimus (ZORTRESS) 0.75 MG TABS Take 0.75 mg by mouth 2 (two) times daily.     [name] Kitchen Fexofenadine HCl (ALLERGY 24-HR PO) Take 1 tablet by mouth daily.    . furosemide (LASIX) 40 MG tablet TAKE 1 TABLET BY MOUTH EVERY DAY 90 tablet 1  . Multiple Vitamin (MULTIVITAMIN WITH MINERALS) TABS tablet Take 1 tablet by mouth daily.    . Mycophenolate Sodium (MYCOPHENOLIC ACID PO) Take 831 mg by mouth 2 (two) times daily.    . tadalafil (CIALIS) 10 MG tablet TAKE ONE TABLET BY MOUTH DAILY AS NEEDED FOR ERECTILE DYSFUNCTION 30 tablet 3  . tadalafil (CIALIS) 20 MG tablet Take 1 tablet (20 mg total) by mouth daily as needed for erectile dysfunction. 30 tablet 5   No current facility-administered medications on file prior to visit.    Allergies  Allergen Reactions  . Nsaids Other (See Comments)    Liver  transplant 09/13/2015     Family History  Problem Relation Age of Onset  . Heart disease Sister   . Heart disease Brother   . Hyperlipidemia Mother   . Hyperlipidemia Father   . Heart disease Sister   . Liver disease Sister   . Prostate cancer Brother   . Colon cancer Neg Hx   . Pancreatic cancer Neg Hx   . Stomach cancer Neg Hx   . Esophageal cancer Neg Hx     BP (!) 160/100 (BP Location: Left Arm, Patient Position: Sitting)   Pulse 75   Ht 6' 0.01" (1.829 m)   Wt 246 lb (111.6 kg)   SpO2 97%   BMI 33.36 kg/m   Review of Systems Denies chest pain.  No weight change.      Objective:   Physical Exam VITAL SIGNS:  See vs page GENERAL: no distress Ext: trace bilat leg edema.    Lab Results  Component Value Date   CHOL 194 03/20/2020   HDL 46.60 03/20/2020   LDLCALC 117 (H) 03/20/2020   TRIG 152.0 (H) 03/20/2020   CHOLHDL 4 03/20/2020       Assessment & Plan:  Edema: this is a relative contraindication to pioglitazone. HTN: is noted today Dyslipidemia: I have sent a prescription to your pharmacy, to add colesevelam  Patient Instructions  Your blood pressure is high today.  Please see your primary care provider soon, to have it rechecked.  Blood tests are requested for you today.  We'll let you know about the results.  Based on the results, we might need to add a pill called "fenofibrate." Please come back for a follow-up appointment in 1 year.

## 2020-03-20 NOTE — Patient Instructions (Addendum)
Your blood pressure is high today.  Please see your primary care provider soon, to have it rechecked.  Blood tests are requested for you today.  We'll let you know about the results.  Based on the results, we might need to add a pill called "fenofibrate." Please come back for a follow-up appointment in 1 year.

## 2020-04-17 ENCOUNTER — Telehealth: Payer: Self-pay | Admitting: Cardiology

## 2020-04-17 DIAGNOSIS — I77810 Thoracic aortic ectasia: Secondary | ICD-10-CM

## 2020-04-17 DIAGNOSIS — I359 Nonrheumatic aortic valve disorder, unspecified: Secondary | ICD-10-CM

## 2020-04-17 NOTE — Telephone Encounter (Signed)
Patient called and scheduled yearly follow up appointment with Dr. Marlou Porch for 06/08/20 at 8:00 AM. Per recall note, patient needs to have an echocardiogram prior to appointment. However, we do not have an order placed for an echocardiogram. Patient is also requesting to discuss having a stress test prior to appointment as well. Please call to discuss.

## 2020-04-18 NOTE — Telephone Encounter (Signed)
Spoke with patient who is aware he will be called to be scheduled for the 2 D echo. Pt is asking for a stress test before his appt,  since it has been so long since he had one and he had CABG 07/2009.  Advised I will have Dr Marlou Porch to review this information and c/b with any new orders.  Pt has not been having any cp/sob.  Wt 238 lbs.  Pt is agreeable to plan and will await a c/b.

## 2020-04-18 NOTE — Telephone Encounter (Signed)
Lets have his visit first before ordering any stress test Candee Furbish, MD

## 2020-04-18 NOTE — Telephone Encounter (Signed)
Pt aware echo has been ordered and he will be called to be scheduled.  Also aware Dr Marlou Porch prefers to see him prior to decision if any further testing is needed.

## 2020-04-23 ENCOUNTER — Encounter: Payer: Self-pay | Admitting: Family Medicine

## 2020-04-24 DIAGNOSIS — H1012 Acute atopic conjunctivitis, left eye: Secondary | ICD-10-CM | POA: Diagnosis not present

## 2020-04-24 DIAGNOSIS — H04122 Dry eye syndrome of left lacrimal gland: Secondary | ICD-10-CM | POA: Diagnosis not present

## 2020-04-24 DIAGNOSIS — H25813 Combined forms of age-related cataract, bilateral: Secondary | ICD-10-CM | POA: Diagnosis not present

## 2020-04-24 NOTE — Telephone Encounter (Signed)
Pt notified not need Rx for covid booster. Patient stated is in a research program and PI stated not recommend booster at this time.  Hx Liver transplant. Requesting PCP opinion.

## 2020-04-27 ENCOUNTER — Encounter: Payer: Self-pay | Admitting: Family Medicine

## 2020-04-27 DIAGNOSIS — Z23 Encounter for immunization: Secondary | ICD-10-CM | POA: Diagnosis not present

## 2020-05-01 ENCOUNTER — Encounter: Payer: Self-pay | Admitting: Family Medicine

## 2020-05-01 DIAGNOSIS — H04122 Dry eye syndrome of left lacrimal gland: Secondary | ICD-10-CM | POA: Diagnosis not present

## 2020-05-01 DIAGNOSIS — H0102A Squamous blepharitis right eye, upper and lower eyelids: Secondary | ICD-10-CM | POA: Diagnosis not present

## 2020-05-01 DIAGNOSIS — H0102B Squamous blepharitis left eye, upper and lower eyelids: Secondary | ICD-10-CM | POA: Diagnosis not present

## 2020-05-01 DIAGNOSIS — H40013 Open angle with borderline findings, low risk, bilateral: Secondary | ICD-10-CM | POA: Diagnosis not present

## 2020-05-01 DIAGNOSIS — H25813 Combined forms of age-related cataract, bilateral: Secondary | ICD-10-CM | POA: Diagnosis not present

## 2020-05-01 DIAGNOSIS — H1012 Acute atopic conjunctivitis, left eye: Secondary | ICD-10-CM | POA: Diagnosis not present

## 2020-05-02 ENCOUNTER — Other Ambulatory Visit: Payer: Self-pay

## 2020-05-02 ENCOUNTER — Ambulatory Visit (HOSPITAL_COMMUNITY): Payer: Medicare Other | Attending: Cardiology

## 2020-05-02 DIAGNOSIS — I359 Nonrheumatic aortic valve disorder, unspecified: Secondary | ICD-10-CM | POA: Diagnosis not present

## 2020-05-02 DIAGNOSIS — I77810 Thoracic aortic ectasia: Secondary | ICD-10-CM | POA: Diagnosis not present

## 2020-05-02 LAB — ECHOCARDIOGRAM COMPLETE
Area-P 1/2: 2.19 cm2
S' Lateral: 4.1 cm

## 2020-05-03 ENCOUNTER — Other Ambulatory Visit: Payer: Self-pay

## 2020-05-03 ENCOUNTER — Encounter: Payer: Self-pay | Admitting: Family Medicine

## 2020-05-03 DIAGNOSIS — R21 Rash and other nonspecific skin eruption: Secondary | ICD-10-CM

## 2020-05-07 ENCOUNTER — Other Ambulatory Visit: Payer: Self-pay | Admitting: Family Medicine

## 2020-05-07 ENCOUNTER — Other Ambulatory Visit: Payer: Self-pay | Admitting: Endocrinology

## 2020-05-17 ENCOUNTER — Encounter: Payer: Self-pay | Admitting: Family Medicine

## 2020-05-17 DIAGNOSIS — Z944 Liver transplant status: Secondary | ICD-10-CM | POA: Diagnosis not present

## 2020-05-17 DIAGNOSIS — D849 Immunodeficiency, unspecified: Secondary | ICD-10-CM | POA: Diagnosis not present

## 2020-06-08 ENCOUNTER — Ambulatory Visit: Payer: Medicare Other | Admitting: Cardiology

## 2020-06-11 ENCOUNTER — Encounter: Payer: Self-pay | Admitting: Family Medicine

## 2020-06-11 ENCOUNTER — Other Ambulatory Visit: Payer: Self-pay

## 2020-06-11 ENCOUNTER — Ambulatory Visit (INDEPENDENT_AMBULATORY_CARE_PROVIDER_SITE_OTHER): Payer: Medicare Other | Admitting: Family Medicine

## 2020-06-11 VITALS — BP 155/86 | HR 60 | Temp 98.0°F | Ht 72.0 in | Wt 245.0 lb

## 2020-06-11 DIAGNOSIS — N390 Urinary tract infection, site not specified: Secondary | ICD-10-CM

## 2020-06-11 DIAGNOSIS — G6289 Other specified polyneuropathies: Secondary | ICD-10-CM

## 2020-06-11 DIAGNOSIS — Z23 Encounter for immunization: Secondary | ICD-10-CM | POA: Diagnosis not present

## 2020-06-11 DIAGNOSIS — Z79899 Other long term (current) drug therapy: Secondary | ICD-10-CM | POA: Diagnosis not present

## 2020-06-11 DIAGNOSIS — N50812 Left testicular pain: Secondary | ICD-10-CM | POA: Diagnosis not present

## 2020-06-11 DIAGNOSIS — G609 Hereditary and idiopathic neuropathy, unspecified: Secondary | ICD-10-CM | POA: Diagnosis not present

## 2020-06-11 DIAGNOSIS — N3943 Post-void dribbling: Secondary | ICD-10-CM

## 2020-06-11 DIAGNOSIS — N401 Enlarged prostate with lower urinary tract symptoms: Secondary | ICD-10-CM | POA: Diagnosis not present

## 2020-06-11 DIAGNOSIS — R030 Elevated blood-pressure reading, without diagnosis of hypertension: Secondary | ICD-10-CM | POA: Insufficient documentation

## 2020-06-11 DIAGNOSIS — K439 Ventral hernia without obstruction or gangrene: Secondary | ICD-10-CM

## 2020-06-11 DIAGNOSIS — N50811 Right testicular pain: Secondary | ICD-10-CM | POA: Diagnosis not present

## 2020-06-11 LAB — POCT URINALYSIS DIPSTICK
Bilirubin, UA: NEGATIVE
Blood, UA: NEGATIVE
Glucose, UA: NEGATIVE
Ketones, UA: NEGATIVE
Leukocytes, UA: NEGATIVE
Nitrite, UA: NEGATIVE
Protein, UA: NEGATIVE
Spec Grav, UA: 1.01 (ref 1.010–1.025)
Urobilinogen, UA: 0.2 E.U./dL
pH, UA: 7 (ref 5.0–8.0)

## 2020-06-11 MED ORDER — TAMSULOSIN HCL 0.4 MG PO CAPS: 0.4000 mg | ORAL_CAPSULE | Freq: Every day | ORAL | 3 refills | Status: DC

## 2020-06-11 NOTE — Assessment & Plan Note (Signed)
Easily reducible.  No red flags.  Will continue with watchful waiting.  Declined surgical referral for the time being.

## 2020-06-11 NOTE — Assessment & Plan Note (Signed)
He will work on cutting out salt.  Continue home monitoring goal 140/90 or lower.  Has previously been well controlled.

## 2020-06-11 NOTE — Progress Notes (Signed)
dip 

## 2020-06-11 NOTE — Assessment & Plan Note (Signed)
Still not controlled with Cialis 20 mg daily.  We will add on Flomax 0.4 mg daily and place referral to urology.

## 2020-06-11 NOTE — Patient Instructions (Addendum)
It was very nice to see you today!  Please cut down on your salt and keep and eye on your BP.  I will place a referral to the urologist. Please start the flomax.  I think you may have peripheral neuropathy.   We will give you a flu vaccine today.  You do not need any further pneumonia vaccines.   Take care, Dr Jerline Pain  Please try these tips to maintain a healthy lifestyle:   Eat at least 3 REAL meals and 1-2 snacks per day.  Aim for no more than 5 hours between eating.  If you eat breakfast, please do so within one hour of getting up.    Each meal should contain half fruits/vegetables, one quarter protein, and one quarter carbs (no bigger than a computer mouse)   Cut down on sweet beverages. This includes juice, soda, and sweet tea.     Drink at least 1 glass of water with each meal and aim for at least 8 glasses per day   Exercise at least 150 minutes every week.

## 2020-06-11 NOTE — Progress Notes (Signed)
   Brandon [name] is a 69 y.o. male who presents today for an office visit.  Assessment/Plan:  New/Acute Problems: Peripheral Neuropathy Seems to be consistent with metabolic issue given bilateral distribution.  Will check folate, B12, and methylmalonic acid.  Last glucose within normal range, doubt diabetes.. May need referral to neurology if labs are normal.  Testicular pain Symptoms are stable.  No change since last year.  Will reorder ultrasound that was ordered last year.  Axilla Lump Likely benign reactive lymphadenopathy.  Normal exam today.  Chronic Problems Addressed Today: Ventral hernia without obstruction or gangrene Easily reducible.  No red flags.  Will continue with watchful waiting.  Declined surgical referral for the time being.  Elevated blood pressure reading He will work on cutting out salt.  Continue home monitoring goal 140/90 or lower.  Has previously been well controlled.  BPH Still not controlled with Cialis 20 mg daily.  We will add on Flomax 0.4 mg daily and place referral to urology.  Flu vaccine given today. UTD on pneumonia vaccines.    Subjective:  HPI:  Patient is here for follow up. He has several issues he would like to discuss:  He has had elevated blood pressures for the last several weeks.  Normally in the 130s and 70s but has seen some values into the 160s and 170s.  No reported chest pain or shortness of breath.  He has also developed pain in his bilateral feet.  This has been going on for several years.  Had associated numbness and tingling.  Also has extreme reactions to pain.  Pain described as a sharp, shooting, electrical type pain.  Pain is equal in bilateral feet.  He had a bump under his right arm for the past few days.  Seems to have 1 way.  He also has some bumps on his belly he would like to have looked at today.  He is still having testicular pain.  This is been going on for several months.  He was last seen for this 10 months  ago and we ordered a ultrasound which was never done.   See A/p for status of chronic conditions.        Objective:  Physical Exam: BP (!) 155/86   Pulse 60   Temp 98 F (36.7 C) (Temporal)   Ht 6' (1.829 m)   Wt 245 lb (111.1 kg)   SpO2 97%   BMI 33.23 kg/m   Gen: No acute distress, resting comfortably CV: Regular rate and rhythm with no murmurs appreciated Pulm: Normal work of breathing, clear to auscultation bilaterally with no crackles, wheezes, or rhonchi GI: Ventral hernia present.  Easily reducible MSK: - Right Axilla: No lymphadenopathy - Feet: Decreased sensation to light touch in both feet.  DP and TP pulses 1+ and symmetric bilaterally.  Decreased sensation to pinprick. Neuro: Grossly normal, moves all extremities Psych: Normal affect and thought content      [name] M. Jerline Pain, MD 06/11/2020 9:09 AM

## 2020-06-12 DIAGNOSIS — D2272 Melanocytic nevi of left lower limb, including hip: Secondary | ICD-10-CM | POA: Diagnosis not present

## 2020-06-12 DIAGNOSIS — L570 Actinic keratosis: Secondary | ICD-10-CM | POA: Diagnosis not present

## 2020-06-12 DIAGNOSIS — D1801 Hemangioma of skin and subcutaneous tissue: Secondary | ICD-10-CM | POA: Diagnosis not present

## 2020-06-12 DIAGNOSIS — L814 Other melanin hyperpigmentation: Secondary | ICD-10-CM | POA: Diagnosis not present

## 2020-06-12 DIAGNOSIS — L578 Other skin changes due to chronic exposure to nonionizing radiation: Secondary | ICD-10-CM | POA: Diagnosis not present

## 2020-06-12 DIAGNOSIS — L821 Other seborrheic keratosis: Secondary | ICD-10-CM | POA: Diagnosis not present

## 2020-06-12 DIAGNOSIS — D225 Melanocytic nevi of trunk: Secondary | ICD-10-CM | POA: Diagnosis not present

## 2020-06-12 LAB — URINE CULTURE
MICRO NUMBER:: 11027115
Result:: NO GROWTH
SPECIMEN QUALITY:: ADEQUATE

## 2020-06-13 ENCOUNTER — Other Ambulatory Visit: Payer: Self-pay

## 2020-06-13 DIAGNOSIS — G6289 Other specified polyneuropathies: Secondary | ICD-10-CM

## 2020-06-13 LAB — TSH: TSH: 4.08 mIU/L (ref 0.40–4.50)

## 2020-06-13 LAB — VITAMIN B12: Vitamin B-12: 571 pg/mL (ref 200–1100)

## 2020-06-13 LAB — FOLATE: Folate: >24 ng/mL

## 2020-06-13 LAB — METHYLMALONIC ACID, SERUM: Methylmalonic Acid, Quant: 113 nmol/L (ref 87–318)

## 2020-06-13 NOTE — Progress Notes (Signed)
Please inform patient of the following:  Labs all normal.  No explanation for neuropathy.  Recommend referral to neurology.

## 2020-06-15 DIAGNOSIS — H25811 Combined forms of age-related cataract, right eye: Secondary | ICD-10-CM | POA: Diagnosis not present

## 2020-06-18 ENCOUNTER — Encounter: Payer: Self-pay | Admitting: Family Medicine

## 2020-06-19 ENCOUNTER — Ambulatory Visit
Admission: RE | Admit: 2020-06-19 | Discharge: 2020-06-19 | Disposition: A | Discharge status: Home / Self Care | Payer: Medicare Other | Source: Ambulatory Visit | Attending: Family Medicine | Admitting: Family Medicine

## 2020-06-19 DIAGNOSIS — N50812 Left testicular pain: Secondary | ICD-10-CM

## 2020-06-19 DIAGNOSIS — N50811 Right testicular pain: Secondary | ICD-10-CM

## 2020-06-19 DIAGNOSIS — N503 Cyst of epididymis: Secondary | ICD-10-CM | POA: Diagnosis not present

## 2020-06-20 ENCOUNTER — Other Ambulatory Visit: Payer: Self-pay

## 2020-06-20 ENCOUNTER — Encounter: Payer: Self-pay | Admitting: Neurology

## 2020-06-20 DIAGNOSIS — N5082 Scrotal pain: Secondary | ICD-10-CM

## 2020-06-20 NOTE — Progress Notes (Signed)
Please inform patient of the following:  He had several cysts on his ultrasound. These are benign but can be the source of his pain. Recommend referral to urology for further evaluation.  Brandon [name]. Jerline Pain, MD 06/20/2020 9:49 AM

## 2020-07-06 DIAGNOSIS — H25812 Combined forms of age-related cataract, left eye: Secondary | ICD-10-CM | POA: Diagnosis not present

## 2020-07-09 ENCOUNTER — Other Ambulatory Visit: Payer: Self-pay | Admitting: Family Medicine

## 2020-07-16 ENCOUNTER — Encounter: Payer: Self-pay | Admitting: Family Medicine

## 2020-07-16 ENCOUNTER — Other Ambulatory Visit: Payer: Self-pay | Admitting: *Deleted

## 2020-07-16 MED ORDER — TADALAFIL 20 MG PO TABS: 20.0000 mg | ORAL_TABLET | Freq: Every day | ORAL | 5 refills | Status: DC | PRN

## 2020-07-16 NOTE — Telephone Encounter (Signed)
Rx tadalfil  last refill on 08/09/2019

## 2020-07-17 NOTE — Telephone Encounter (Signed)
Please advise 

## 2020-07-25 ENCOUNTER — Encounter: Payer: Self-pay | Admitting: Cardiology

## 2020-07-25 ENCOUNTER — Ambulatory Visit (INDEPENDENT_AMBULATORY_CARE_PROVIDER_SITE_OTHER): Payer: Medicare Other | Admitting: Cardiology

## 2020-07-25 ENCOUNTER — Other Ambulatory Visit: Payer: Self-pay

## 2020-07-25 VITALS — BP 140/80 | HR 74 | Ht 72.0 in | Wt 247.0 lb

## 2020-07-25 DIAGNOSIS — Z944 Liver transplant status: Secondary | ICD-10-CM | POA: Diagnosis not present

## 2020-07-25 DIAGNOSIS — I25810 Atherosclerosis of coronary artery bypass graft(s) without angina pectoris: Secondary | ICD-10-CM

## 2020-07-25 DIAGNOSIS — I77810 Thoracic aortic ectasia: Secondary | ICD-10-CM

## 2020-07-25 DIAGNOSIS — I10 Essential (primary) hypertension: Secondary | ICD-10-CM

## 2020-07-25 MED ORDER — LOSARTAN POTASSIUM 25 MG PO TABS: 25.0000 mg | ORAL_TABLET | Freq: Every day | ORAL | 1 refills | Status: DC
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
## 2020-07-25 NOTE — Patient Instructions (Signed)
Medication Instructions:   START TAKING LOSARTAN 25 MG BY MOUTH DAILY  *If you need a refill on your cardiac medications before your next appointment, please call your pharmacy*   Follow-Up: At [facility], you and your health needs are our priority.  As part of our continuing mission to provide you with exceptional heart care, we have created designated Provider Care Teams.  These Care Teams include your primary Cardiologist (physician) and Advanced Practice Providers (APPs -  Physician Assistants and Nurse Practitioners) who all work together to provide you with the care you need, when you need it.  We recommend signing up for the patient portal called "MyChart".  Sign up information is provided on this After Visit Summary.  MyChart is used to connect with patients for Virtual Visits (Telemedicine).  Patients are able to view lab/test results, encounter notes, upcoming appointments, etc.  Non-urgent messages can be sent to your provider as well.   To learn more about what you can do with MyChart, go to [email or web address].    Your next appointment:   2 month(s)  The format for your next appointment:   In Person  Provider:   You will see one of the following Advanced Practice Providers on your designated Care Team:     Kathyrn Drown, NP

## 2020-07-26 ENCOUNTER — Ambulatory Visit: Payer: Medicare Other | Admitting: Cardiology

## 2020-08-06 ENCOUNTER — Other Ambulatory Visit: Payer: Self-pay | Admitting: Endocrinology

## 2020-09-03 ENCOUNTER — Other Ambulatory Visit: Payer: Self-pay | Admitting: Family Medicine

## 2020-09-19 NOTE — Progress Notes (Deleted)
Cardiology Office Note   Date:  09/19/2020   ID:  Brandon [name], DOB May 07, 1951, MRN [id number]  PCP:  [name], MD  Cardiologist:  Dr. Candee Furbish, MD   No chief complaint on file.  History of Present Illness: Brandon [name] is a 70 y.o. male who presents for 2 week follow up, seen for Dr. Marlou Porch.   Brandon [name] has a hx of CAD s/p CABG 2010, OSA, post-operative AF, s/p liver transplant and BPH.   He was initially dx with CAD in 2010 after presenting to the ED with chest pain and underwent LHC showing severe three vessel CAD treated with CABG  revascularization per Dr. Darcey Nora.   He was most recently seen by Dr. Marlou Porch 07/25/20 at which time he was doing well from a CV standpoint however BPs were more elevated than his prior baseline. Given this he was started on losartan with plans for close follow up   Today   1. Essential HTN: -Losartan started at last follow up with Dr. Marlou Porch due to persistently elevated BPs  -BP today   2. CAD s/p CABG: -Doing well -Continue    3. Dilated aortic root: -Measuring 59mm on last echocardiogram 05/02/2020 -Plan to repeat echo this year   4. HLD: -Last LDL,  -Continue Crestor   5. Liver transplant: -Followed OP at [facility] >>initially performed in [place] at [place] with Lasix       Past Medical History:  Diagnosis Date  . BPH (benign prostatic hyperplasia)   . Cancer [facility])    Hepatic  . Coronary artery disease   . Hyperlipidemia   . Hypertension   . Liver failure ([place])    Cirrhosis- alcoholic- Liver transplant 2017  . Pneumonia    as a kid    Past Surgical History:  Procedure Laterality Date  . COLONOSCOPY W/ POLYPECTOMY    . CORONARY ARTERY BYPASS GRAFT  07/2009  . LIVER TRANSPLANT  2017  . ORIF CLAVICULAR FRACTURE Left 04/29/2019   Procedure: OPEN REDUCTION INTERNAL FIXATION (ORIF) CLAVICULAR FRACTURE;  Surgeon: Altamese [place], MD;  Location: [place];  Service: Orthopedics;  Laterality: Left;      Current Outpatient Medications  Medication Sig Dispense Refill  . aspirin EC 81 MG tablet Take 81 mg by mouth daily.    . calcium gluconate 500 MG tablet Take 1 tablet by mouth 2 (two) times daily.     . Everolimus (ZORTRESS) 0.75 MG TABS Take 0.75 mg by mouth 2 (two) times daily.     [name] Kitchen Fexofenadine HCl (ALLERGY 24-HR PO) Take 1 tablet by mouth daily.    . furosemide (LASIX) 40 MG tablet TAKE ONE TABLET BY MOUTH DAILY 90 tablet 1  . losartan (COZAAR) 25 MG tablet Take 1 tablet (25 mg total) by mouth daily. 90 tablet 1  . Multiple Vitamin (MULTIVITAMIN WITH MINERALS) TABS tablet Take 1 tablet by mouth daily.    . Mycophenolate Sodium (MYCOPHENOLIC ACID PO) Take [id number] mg by mouth 2 (two) times daily.    . rosuvastatin (CRESTOR) 5 MG tablet TAKE ONE TABLET BY MOUTH DAILY 60 tablet 0  . tadalafil (CIALIS) 20 MG tablet Take 1 tablet (20 mg total) by mouth daily as needed for erectile dysfunction. 30 tablet 5  . tamsulosin (FLOMAX) 0.4 MG CAPS capsule TAKE ONE CAPSULE BY MOUTH DAILY 22 capsule 2   No current facility-administered medications for this visit.    Allergies:   Nsaids    Social History:  The  patient  reports that he quit smoking about 22 years ago. His smoking use included cigarettes. He has a 25.00 pack-year smoking history. He has never used smokeless tobacco. He reports previous alcohol use. He reports that he does not use drugs.   Family History:  The patient's family history includes Heart disease in his brother, sister, and sister; Hyperlipidemia in his father and mother; Liver disease in his sister; Prostate cancer in his brother.    ROS:  Please see the history of present illness.   Otherwise, review of systems are positive for none. All other systems are reviewed and negative.    PHYSICAL EXAM: VS:  There were no vitals taken for this visit. , BMI There is no height or weight on file to calculate BMI.   General: Well developed, well nourished, NAD Skin: Warm, dry,  intact  Head: Normocephalic, atraumatic, sclera non-icteric, no xanthomas, clear, moist mucus membranes. Neck: Negative for carotid bruits. No JVD Lungs:Clear to ausculation bilaterally. No wheezes, rales, or rhonchi. Breathing is unlabored. Cardiovascular: RRR with S1 S2. No murmurs, rubs, gallops, or LV heave appreciated. Abdomen: Soft, non-tender, non-distended with normoactive bowel sounds. No hepatomegaly, No rebound/guarding. No obvious abdominal masses. MSK: Strength and tone appear normal for age. 5/5 in all extremities Extremities: No edema. No clubbing or cyanosis. DP/PT pulses 2+ bilaterally Neuro: Alert and oriented. No focal deficits. No facial asymmetry. MAE spontaneously. Psych: Responds to questions appropriately with normal affect.    EKG:  EKG {ACTION; IS/IS [id number]} ordered today. The ekg ordered today demonstrates ***   Recent Labs: 06/11/2020: TSH 4.08    Lipid Panel    Component Value Date/Time   CHOL 194 03/20/2020 1039   TRIG 152.0 (H) 03/20/2020 1039   HDL 46.60 03/20/2020 1039   CHOLHDL 4 03/20/2020 1039   VLDL 30.4 03/20/2020 1039   LDLCALC 117 (H) 03/20/2020 1039      Wt Readings from Last 3 Encounters:  07/25/20 247 lb (112 kg)  06/11/20 245 lb (111.1 kg)  03/20/20 246 lb (111.6 kg)     Other studies Reviewed: Additional studies/ records that were reviewed today include: ***. Review of the above records demonstrates: ***  Echocardiogram 05/02/2020:   1. Left ventricular ejection fraction, by estimation, is 60 to 65%. The  left ventricle has normal function. The left ventricle has no regional  wall motion abnormalities. There is mild left ventricular hypertrophy.  Left ventricular diastolic parameters  are consistent with Grade I diastolic dysfunction (impaired relaxation).  2. Right ventricular systolic function is normal. The right ventricular  size is normal. There is normal pulmonary artery systolic pressure.  3. Left atrial  size was mildly dilated.  4. The mitral valve is normal in structure. No evidence of mitral valve  regurgitation. No evidence of mitral stenosis.  5. The aortic valve is tricuspid. Aortic valve regurgitation is not  visualized. Mild to moderate aortic valve sclerosis/calcification is  present, without any evidence of aortic stenosis.  6. Aortic dilatation noted. There is mild dilatation of the ascending  aorta measuring 45 mm.  7. The inferior vena cava is normal in size with greater than 50%  respiratory variability, suggesting right atrial pressure of 3 mmHg.   ASSESSMENT AND PLAN:  1.  ***   Current medicines are reviewed at length with the patient today.  The patient {ACTIONS; HAS/DOES NOT HAVE:19233} concerns regarding medicines.  The following changes have been made:  {PLAN; NO CHANGE:13088:s}  Labs/ tests ordered today include: *** No  orders of the defined types were placed in this encounter.    Disposition:   FU with *** in {gen number 8-18:299371} {Days to years:10300}  Signed, Kathyrn Drown, NP  09/19/2020 2:33 PM    [facility] [place], [place], [place]  [address] Phone: [phone]; Fax: [phone]

## 2020-09-21 ENCOUNTER — Other Ambulatory Visit: Payer: Self-pay

## 2020-09-21 ENCOUNTER — Encounter: Payer: Self-pay | Admitting: Neurology

## 2020-09-21 ENCOUNTER — Ambulatory Visit (INDEPENDENT_AMBULATORY_CARE_PROVIDER_SITE_OTHER): Payer: Medicare Other | Admitting: Neurology

## 2020-09-21 VITALS — BP 157/84 | HR 69 | Ht 72.0 in | Wt 253.0 lb

## 2020-09-21 DIAGNOSIS — G621 Alcoholic polyneuropathy: Secondary | ICD-10-CM | POA: Diagnosis not present

## 2020-09-21 DIAGNOSIS — M79671 Pain in right foot: Secondary | ICD-10-CM | POA: Diagnosis not present

## 2020-09-21 DIAGNOSIS — G8929 Other chronic pain: Secondary | ICD-10-CM

## 2020-09-21 DIAGNOSIS — M79672 Pain in left foot: Secondary | ICD-10-CM | POA: Diagnosis not present

## 2020-09-21 NOTE — Patient Instructions (Signed)
If your symptoms progress, please contact my office and we can do additional labs and/or nerve testing

## 2020-09-21 NOTE — Progress Notes (Signed)
[facility] Clinic Note - Initial Visit   Date: 09/21/20  Brandon [name] MRN: [id number] DOB: 1950/12/30   Dear DrMarland Kitchen Jerline Pain:  Thank you for your kind referral of Kartier Bennison for consultation of neuropathy. Although his history is well known to you, please allow [name] to reiterate it for the purpose of our medical record. The patient was accompanied to the clinic by self    History of Present Illness: Brandon [name] is a 70 y.o. right-handed male with history of alcohol abuse, hyperlipidemia, hypertension, and liver cirrhosis s/p liver transplant presenting for evaluation of bilateral feet numbness.  Starting around 5 years ago, he began having numbness and tingling in the toes, feet, and lower legs.  Symptoms are constant, without any alleviating or aggravating factors.  He denies imbalance and walks unassisted.  No weakness.  No similar symptoms in the hands. No history of diabetes. PCP check labs including vitamin B12, folate, HbA1c, and TSH which was normal.   He also complains of achy pain in the feet, worse in the evening.   He suffered on fall after tripping on a curb.   He was previously drinking heavily for 30+ years (12-pack beer), he has been sober since ~2016.  Lab Results  Component Value Date   FOLATE >24.0 06/11/2020    Out-side paper records, electronic medical record, and images have been reviewed where available and summarized as:  Lab Results  Component Value Date   HGBA1C  07/26/2009    5.5 (NOTE) The ADA recommends the following therapeutic goal for glycemic control related to Hgb A1c measurement: Goal of therapy: <6.5 Hgb A1c  Reference: American Diabetes Association: Clinical Practice Recommendations 2010, Diabetes Care, 2010, 33: (Suppl  1).   Lab Results  Component Value Date   [id number] 761 06/11/2020   Lab Results  Component Value Date   TSH 4.08 06/11/2020   .lastfo  Past Medical History:  Diagnosis Date  . BPH  (benign prostatic hyperplasia)   . Cancer [facility])    Hepatic  . Coronary artery disease   . Hyperlipidemia   . Hypertension   . Liver failure ([place])    Cirrhosis- alcoholic- Liver transplant 2017  . Pneumonia    as a kid    Past Surgical History:  Procedure Laterality Date  . COLONOSCOPY W/ POLYPECTOMY    . CORONARY ARTERY BYPASS GRAFT  07/2009  . LIVER TRANSPLANT  2017  . ORIF CLAVICULAR FRACTURE Left 04/29/2019   Procedure: OPEN REDUCTION INTERNAL FIXATION (ORIF) CLAVICULAR FRACTURE;  Surgeon: Altamese Hickory, MD;  Location: [place];  Service: Orthopedics;  Laterality: Left;     Medications:  Outpatient Encounter Medications as of 09/21/2020  Medication Sig  . aspirin EC 81 MG tablet Take 81 mg by mouth daily.  . calcium gluconate 500 MG tablet Take 1 tablet by mouth 2 (two) times daily.   . Everolimus 0.75 MG TABS Take 0.75 mg by mouth 2 (two) times daily.   . furosemide (LASIX) 40 MG tablet TAKE ONE TABLET BY MOUTH DAILY  . losartan (COZAAR) 25 MG tablet Take 1 tablet (25 mg total) by mouth daily.  . Multiple Vitamin (MULTIVITAMIN WITH MINERALS) TABS tablet Take 1 tablet by mouth daily.  . Mycophenolate Sodium (MYCOPHENOLIC ACID PO) Take 607 mg by mouth 2 (two) times daily.  . rosuvastatin (CRESTOR) 5 MG tablet TAKE ONE TABLET BY MOUTH DAILY  . tadalafil (CIALIS) 20 MG tablet Take 1 tablet (20 mg total) by mouth daily as needed for  erectile dysfunction.  . tamsulosin (FLOMAX) 0.4 MG CAPS capsule TAKE ONE CAPSULE BY MOUTH DAILY  . Fexofenadine HCl (ALLERGY 24-HR PO) Take 1 tablet by mouth daily. (Patient not taking: Reported on 09/21/2020)   No facility-administered encounter medications on file as of 09/21/2020.    Allergies:  Allergies  Allergen Reactions  . Nsaids Other (See Comments)    Liver transplant 09/13/2015     Family History: Family History  Problem Relation Age of Onset  . Heart disease Sister   . Heart disease Brother   . Hyperlipidemia Mother   .  Hyperlipidemia Father   . Heart disease Sister   . Liver disease Sister   . Prostate cancer Brother   . Colon cancer Neg Hx   . Pancreatic cancer Neg Hx   . Stomach cancer Neg Hx   . Esophageal cancer Neg Hx     Social History: Social History   Tobacco Use  . Smoking status: Former Smoker    Packs/day: 1.00    Years: 25.00    Pack years: 25.00    Types: Cigarettes    Quit date: 01/12/1998    Years since quitting: 22.7  . Smokeless tobacco: Never Used  Vaping Use  . Vaping Use: Never used  Substance Use Topics  . Alcohol use: Not Currently    Comment: none 2017  . Drug use: Never   Social History   Social History Narrative   Right Handed   Lives in a one story home   Drinks caffeine     Vital Signs:  BP (!) 157/84   Pulse 69   Ht 6' (1.829 m)   Wt 253 lb (114.8 kg)   SpO2 97%   BMI 34.31 kg/m   Neurological Exam: MENTAL STATUS including orientation to time, place, person, recent and remote memory, attention span and concentration, language, and fund of knowledge is normal.  Speech is not dysarthric.  CRANIAL NERVES: II:  No visual field defects.  .   III-IV-VI: Pupils equal round and reactive to light.  Normal conjugate, extra-ocular eye movements in all directions of gaze.  No nystagmus.  No ptosis.   V:  Normal facial sensation.    VII:  Normal facial symmetry and movements.   VIII:  Normal hearing and vestibular function.   IX-X:  Normal palatal movement.   XI:  Normal shoulder shrug and head rotation.   XII:  Normal tongue strength and range of motion, no deviation or fasciculation.  MOTOR:  No atrophy, fasciculations or abnormal movements.  No pronator drift.   Upper Extremity:  Right  Left  Deltoid  5/5   5/5   Biceps  5/5   5/5   Triceps  5/5   5/5   Infraspinatus 5/5  5/5  Medial pectoralis 5/5  5/5  Wrist extensors  5/5   5/5   Wrist flexors  5/5   5/5   Finger extensors  5/5   5/5   Finger flexors  5/5   5/5   Dorsal interossei  5/5   5/5    Abductor pollicis  5/5   5/5   Tone (Ashworth scale)  0  0   Lower Extremity:  Right  Left  Hip flexors  5/5   5/5   Hip extensors  5/5   5/5   Adductor 5/5  5/5  Abductor 5/5  5/5  Knee flexors  5/5   5/5   Knee extensors  5/5   5/5  Dorsiflexors  5/5   5/5   Plantarflexors  5/5   5/5   Toe extensors  5/5   5/5   Toe flexors  5/5   5/5   Tone (Ashworth scale)  0  0   MSRs:  Right        Left                  brachioradialis 2+  2+  biceps 2+  2+  triceps 2+  2+  patellar 2+  2+  ankle jerk 0  0  Hoffman no  no  plantar response down  down   SENSORY:  Reduced sensation to temperature, pin prick and vibrations in the feet.  Sensation intact above the ankles and in the hands.  Romberg's sign absent.   COORDINATION/GAIT: Normal finger-to- nose-finger.  Intact rapid alternating movements bilaterally.   Gait narrow based and stable. Tandem and stressed gait intact.    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  Symptoms manifesting with predominately numbness>> tingling.  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 Discussed role of electrodiagnostic testing for neuropathy as well as a few additional labs.  Given the high probability of alcohol-induced neuropathy, it was mutually decided to hold on testing unless symptoms were to progress in an atypical manner.  Patient educated on daily foot inspection, fall prevention, and safety precautions around the home.  He may also have overlapping musculoskeletal pain causing achy symptoms in the feet, especially with weight bearing.  If symptoms get worse, he may benefit from seeing podiatry.   Total time spent reviewing records, interview, history/exam, documentation, and coordination of care on day of encounter:  45 min   Thank you for allowing me to participate in patient's care.  If I can answer any additional questions, I would be pleased to do so.     Sincerely,    Kyle Luppino K. Posey Pronto, DO

## 2020-09-26 ENCOUNTER — Encounter: Payer: Self-pay | Admitting: Cardiology

## 2020-09-26 ENCOUNTER — Other Ambulatory Visit: Payer: Self-pay

## 2020-09-26 ENCOUNTER — Ambulatory Visit (INDEPENDENT_AMBULATORY_CARE_PROVIDER_SITE_OTHER): Payer: Medicare Other | Admitting: Cardiology

## 2020-09-26 ENCOUNTER — Ambulatory Visit: Payer: Medicare Other | Admitting: Cardiology

## 2020-09-26 VITALS — BP 130/70 | HR 74 | Ht 72.0 in | Wt 250.0 lb

## 2020-09-26 DIAGNOSIS — I77810 Thoracic aortic ectasia: Secondary | ICD-10-CM | POA: Diagnosis not present

## 2020-09-26 DIAGNOSIS — Z944 Liver transplant status: Secondary | ICD-10-CM | POA: Diagnosis not present

## 2020-09-26 DIAGNOSIS — I25810 Atherosclerosis of coronary artery bypass graft(s) without angina pectoris: Secondary | ICD-10-CM | POA: Diagnosis not present

## 2020-09-26 DIAGNOSIS — I10 Essential (primary) hypertension: Secondary | ICD-10-CM | POA: Diagnosis not present

## 2020-09-26 NOTE — Patient Instructions (Signed)
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
## 2020-09-27 DIAGNOSIS — Z940 Kidney transplant status: Secondary | ICD-10-CM | POA: Diagnosis not present

## 2020-09-27 DIAGNOSIS — Z944 Liver transplant status: Secondary | ICD-10-CM | POA: Diagnosis not present

## 2020-09-27 DIAGNOSIS — D849 Immunodeficiency, unspecified: Secondary | ICD-10-CM | POA: Diagnosis not present

## 2020-10-09 DIAGNOSIS — D849 Immunodeficiency, unspecified: Secondary | ICD-10-CM | POA: Diagnosis not present

## 2020-10-09 DIAGNOSIS — C220 Liver cell carcinoma: Secondary | ICD-10-CM | POA: Diagnosis not present

## 2020-10-09 DIAGNOSIS — Z944 Liver transplant status: Secondary | ICD-10-CM | POA: Diagnosis not present

## 2020-10-10 DIAGNOSIS — Z20822 Contact with and (suspected) exposure to covid-19: Secondary | ICD-10-CM | POA: Diagnosis not present

## 2020-10-11 ENCOUNTER — Other Ambulatory Visit: Payer: Self-pay | Admitting: Endocrinology

## 2020-10-31 ENCOUNTER — Other Ambulatory Visit: Payer: Self-pay | Admitting: Family Medicine

## 2020-11-10 ENCOUNTER — Other Ambulatory Visit: Payer: Self-pay | Admitting: Family Medicine

## 2020-11-22 DIAGNOSIS — Z23 Encounter for immunization: Secondary | ICD-10-CM | POA: Diagnosis not present

## 2020-12-01 ENCOUNTER — Other Ambulatory Visit: Payer: Self-pay | Admitting: Endocrinology

## 2020-12-01 NOTE — Telephone Encounter (Signed)
Please Advise

## 2020-12-03 ENCOUNTER — Encounter: Payer: Self-pay | Admitting: Endocrinology

## 2020-12-10 ENCOUNTER — Encounter: Payer: Self-pay | Admitting: Family Medicine

## 2020-12-10 NOTE — Telephone Encounter (Signed)
Please advise 

## 2020-12-11 ENCOUNTER — Other Ambulatory Visit: Payer: Self-pay

## 2020-12-11 ENCOUNTER — Encounter: Payer: Self-pay | Admitting: Family Medicine

## 2020-12-11 ENCOUNTER — Ambulatory Visit (INDEPENDENT_AMBULATORY_CARE_PROVIDER_SITE_OTHER): Payer: Medicare Other | Admitting: Family Medicine

## 2020-12-11 VITALS — BP 124/76 | HR 70 | Temp 98.7°F | Wt 237.2 lb

## 2020-12-11 DIAGNOSIS — T451X5A Adverse effect of antineoplastic and immunosuppressive drugs, initial encounter: Secondary | ICD-10-CM

## 2020-12-11 DIAGNOSIS — G620 Drug-induced polyneuropathy: Secondary | ICD-10-CM

## 2020-12-11 DIAGNOSIS — M549 Dorsalgia, unspecified: Secondary | ICD-10-CM | POA: Diagnosis not present

## 2020-12-11 MED ORDER — GABAPENTIN 300 MG PO CAPS: 300.0000 mg | ORAL_CAPSULE | Freq: Every day | ORAL | 3 refills | Status: DC

## 2020-12-11 NOTE — Assessment & Plan Note (Addendum)
Symptoms are not controlled.  We will start gabapentin 300 mg nightly.  Discussed potential side effects.  He will check in with me in a week or 2 and we can titrate dose as needed.

## 2020-12-11 NOTE — Patient Instructions (Signed)
It was very nice to see you today!  Please start the gabapentin.  Take 300 mg daily.  Please check in with me in a week or 2 to let me know how this is working for you and we can adjust the dose as needed.  Please work on the exercises for your back.  Also place referral for you to see a physical therapist.  Take care, Dr Jerline Pain  PLEASE NOTE:  If you had any lab tests please let us know if you have not heard back within a few days. You may see your results on mychart before we have a chance to review them but we will give you a call once they are reviewed by [name]. If we ordered any referrals today, please let us know if you have not heard from their office within the next week.   Please try these tips to maintain a healthy lifestyle:   Eat at least 3 REAL meals and 1-2 snacks per day.  Aim for no more than 5 hours between eating.  If you eat breakfast, please do so within one hour of getting up.    Each meal should contain half fruits/vegetables, one quarter protein, and one quarter carbs (no bigger than a computer mouse)   Cut down on sweet beverages. This includes juice, soda, and sweet tea.     Drink at least 1 glass of water with each meal and aim for at least 8 glasses per day   Exercise at least 150 minutes every week.

## 2020-12-11 NOTE — Progress Notes (Signed)
   Brandon [name] is a 70 y.o. male who presents today for an office visit.  Assessment/Plan:  New/Acute Problems: Upper back pain Seems to be mostly muscular.  Discussed home exercises and handout was given.  Given that symptoms have been persistent for last several months will place referral to physical therapy for further management and evaluation.  Chronic Problems Addressed Today: Peripheral neuropathy due to chemotherapy ([place]) Symptoms are not controlled.  We will start gabapentin 300 mg nightly.  Discussed potential side effects.  He will check in with me in a week or 2 and we can titrate dose as needed.       Subjective:  HPI:  Patient's peripheral neuropathy is getting worse.  He saw neurology for this a few months ago.  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  Symptoms have progressively been getting worse for last several months.  Now feels discomfort anytime he has pressure on his feet.  Symptoms worsen left foot occurred bilateral feet.  No specific treatments tried.  He has also had intermittent right upper back pain for the last several months.  Usually happens towards the end of the day.  He has been working on some home exercises which have not helped.       Objective:  Physical Exam: BP 124/76   Pulse 70   Temp 98.7 F (37.1 C) (Temporal)   Wt 237 lb 3.2 oz (107.6 kg)   SpO2 95%   BMI 32.17 kg/m   Gen: No acute distress, resting comfortably CV: Regular rate and rhythm with no murmurs appreciated Pulm: Normal work of breathing, clear to auscultation bilaterally with no crackles, wheezes, or rhonchi MSK: Bilateral feet without deformities.  Allodynia in bilateral feet.  Distal pulses intact.  Cap refill normal in bilateral feet. Neuro: Grossly normal, moves all extremities Psych: Normal affect and thought content      Seab [name], MD 12/11/2020 11:24 AM

## 2020-12-14 ENCOUNTER — Encounter: Payer: Self-pay | Admitting: Physical Therapy

## 2020-12-14 ENCOUNTER — Other Ambulatory Visit: Payer: Self-pay

## 2020-12-14 ENCOUNTER — Ambulatory Visit (INDEPENDENT_AMBULATORY_CARE_PROVIDER_SITE_OTHER): Payer: Medicare Other | Admitting: Physical Therapy

## 2020-12-14 DIAGNOSIS — M6281 Muscle weakness (generalized): Secondary | ICD-10-CM

## 2020-12-14 DIAGNOSIS — M62830 Muscle spasm of back: Secondary | ICD-10-CM

## 2020-12-14 DIAGNOSIS — M546 Pain in thoracic spine: Secondary | ICD-10-CM

## 2020-12-14 NOTE — Patient Instructions (Signed)
Access Code: [email or web address] URL: [email or web address] Date: 12/14/2020 Prepared by: Lyndee Hensen  Exercises Seated Scapular Retraction - 1 x daily - 2 sets - 10 reps Supine Shoulder Flexion with Dowel - 1 x daily - 1 sets - 10 reps Supine Transversus Abdominis Bracing - Hands on Ground - 1 x daily - 1 sets - 10 reps - 5 hold Standing Shoulder Posterior Capsule Stretch - 2 x daily - 3 reps - 30 hold

## 2020-12-14 NOTE — Therapy (Signed)
[facility] [address] [place], [place], [address] Phone: [phone]   Fax:  [phone]  Physical Therapy Evaluation  Patient Details  Name: Brandon [name] MRN: [id number] Date of Birth: 08/15/1951 Referring Provider (PT): Dimas Chyle   Encounter Date: 12/14/2020   PT End of Session - 12/14/20 1150    Visit Number 1    Number of Visits 12    Date for PT Re-Evaluation 02/08/21    Authorization Type Medicare    PT Start Time 0845    PT Stop Time 0930    PT Time Calculation (min) 45 min    Activity Tolerance Patient tolerated treatment well    Behavior During Therapy [facility] for tasks assessed/performed           Past Medical History:  Diagnosis Date  . BPH (benign prostatic hyperplasia)   . Cancer [facility])    Hepatic  . Coronary artery disease   . Hyperlipidemia   . Hypertension   . Liver failure ([place])    Cirrhosis- alcoholic- Liver transplant 2017  . Pneumonia    as a kid    Past Surgical History:  Procedure Laterality Date  . COLONOSCOPY W/ POLYPECTOMY    . CORONARY ARTERY BYPASS GRAFT  07/2009  . LIVER TRANSPLANT  2017  . ORIF CLAVICULAR FRACTURE Left 04/29/2019   Procedure: OPEN REDUCTION INTERNAL FIXATION (ORIF) CLAVICULAR FRACTURE;  Surgeon: Altamese Orosi, MD;  Location: [facility];  Service: Orthopedics;  Laterality: Left;    There were no vitals filed for this visit.    Subjective Assessment - 12/14/20 0843    Subjective Pt reports pain in his right scapular region that has persisted for 1 and a half years. Pt has been to PT previously for shoulder pain and reports it was not beneficial. Pain in R shoulder blade aggravated when he is tired or at end of the day.  Pt reports pain in abdominal region that has persisted since his liver transplant. Pain in thoracic region is aggravated when reaching inferiorly and rotating. Patient describes abdominal pain as similiar to a "charlie horse" and radiates from his ribs down his  abdomen. Pt reports being active and going to the gym for UE strengthening and core exercise. He is retired, active around [employer], likes to golf. He exercises regularly, mostly with weight machines, states no pain with exercises.    Pertinent History liver transplant, hernia, CABG surgery    Limitations Lifting;Walking;House hold activities    Patient Stated Goals Decreased pain.    Currently in Pain? Yes    Pain Score 5     Pain Location Shoulder    Pain Orientation Right    Pain Descriptors / Indicators Aching    Pain Type Acute pain    Pain Onset More than a month ago    Pain Frequency Intermittent    Aggravating Factors  Walking long distances    Multiple Pain Sites Yes    Pain Score 6    Pain Location Abdomen    Pain Orientation Right;Left    Pain Descriptors / Indicators Tightness    Pain Type Acute pain    Pain Onset More than a month ago    Pain Frequency Intermittent    Aggravating Factors  Reaching inferiorly and rotating              [facility] PT Assessment - 12/14/20 0001      Assessment   Medical Diagnosis R shoulder blade pain  Referring Provider (PT) Dimas Chyle    Hand Dominance Right    Prior Therapy yes, different clinic      Precautions   Precautions None      Balance Screen   Has the patient fallen in the past 6 months No      Prior Function   Level of Independence Independent      Cognition   Overall Cognitive Status Within Functional Limits for tasks assessed      Posture/Postural Control   Posture Comments seated: rounded shoulders, Flexible thoracic kyphosis      ROM / Strength   AROM / PROM / Strength AROM;Strength      AROM   Overall AROM Comments Lumbar: mild deficits for flex/ext, bil shoulders: WNL      Strength   Overall Strength Comments Shoulders: 5/5, Scapular: 4/5 on R;      Palpation   Palpation comment minimal scar tissue at R abdominal scar, Weakness and large hernia noted with core contraction,  No pain to palpate  abdominals, obliques;   R rhomboid and medial scap border tender.                      Objective measurements completed on examination: See above findings.       [facility] Adult PT Treatment/Exercise - 12/14/20 0001      Exercises   Exercises Lumbar      Lumbar Exercises: Stretches   Other Lumbar Stretch Exercise seated posterior shoulder stretch 20 sex x 3 on R;    Other Lumbar Stretch Exercise Supine shoulder flexion with cane x 10 for lat stretch      Lumbar Exercises: Seated   Other Seated Lumbar Exercises Seated scap retract x 10;      Manual Therapy   Manual Therapy Joint mobilization;Soft tissue mobilization    Joint Mobilization Thoracic PA mobs    Soft tissue mobilization STM/TPR to R medial scap border                  PT Education - 12/14/20 1149    Education Details PT POC, Exam findings, HEP    Person(s) Educated Patient    Methods Explanation;Demonstration;Tactile cues;Verbal cues;Handout    Comprehension Verbalized understanding;Returned demonstration;Verbal cues required;Tactile cues required;Need further instruction            PT Short Term Goals - 12/14/20 1000      PT SHORT TERM GOAL #1   Title Pt will be able to perform initial HEP independently    Time 2    Period Weeks    Status New    Target Date 12/28/20             PT Long Term Goals - 12/14/20 1001      PT LONG TERM GOAL #1   Title Pt will be able to perform functional activities with optimal posture and minimal cueing for posture    Baseline Pt sits with forward head posture and rounded shoulders. Can correct posture when cued.    Time 8    Period Weeks    Status New    Target Date 02/08/21      PT LONG TERM GOAL #2   Title Pt will report pain at 2/10 in thoracic region when performing reaching activies    Time 8    Period Weeks    Status New    Target Date 02/08/21      PT LONG TERM GOAL #3  Title Pt will report pain between 0-2/10 in right scapular  region when performing functional activites and after taking a long walk    Time 8    Period Weeks    Status New    Target Date 02/08/21      PT LONG TERM GOAL #4   Title Pt will be able to perform final HEP independently    Time 8    Period Weeks    Status New    Target Date 02/08/21                  Plan - 12/14/20 1152    Clinical Impression Statement Pt presents with primarycomplaint fo increased pain in R scapular region, as well as bil abdomen. Pt with increased muscle tightness and pain at R medial scap border, likely muscular in nature. Pt wtih poor posture with rounded shoulders, and decreased mechanics with shoulder ROM, that is also likley effecting pain. Pt also with bilateral abdominal muscle spasms with diagonal reaching, likely related to significant weakness in core from previous surgeries. Pt to benefit from skilled PT to improve deficits and pain, and to allow pain free functional activity. Pt to benefit from strengthening for postural muscles as well as core strength and stability for improved control and pain.    Personal Factors and Comorbidities Past/Current Experience;Time since onset of injury/illness/exacerbation    Examination-Activity Limitations Bend;Reach Overhead;Carry;Lift    Examination-Participation Restrictions Cleaning;Community Activity;Yard Work    Stability/Clinical Decision Making Stable/Uncomplicated    Clinical Decision Making Low    Rehab Potential Good    PT Frequency 2x / week    PT Duration 8 weeks    PT Treatment/Interventions ADLs/Self Care Home Management;Cryotherapy;Electrical Stimulation;Iontophoresis 4mg /ml Dexamethasone;Moist Heat;Traction;Ultrasound;DME Instruction;Gait training;Neuromuscular re-education;Balance training;Therapeutic exercise;Therapeutic activities;Functional mobility training;Stair training;Patient/family education;Manual techniques;Passive range of motion;Dry needling;Taping;Joint Manipulations;Spinal  Manipulations    PT Home Exercise Plan [id number]    Consulted and Agree with Plan of Care Patient           Patient will benefit from skilled therapeutic intervention in order to improve the following deficits and impairments:  Decreased range of motion,Increased muscle spasms,Decreased activity tolerance,Pain,Impaired flexibility,Improper body mechanics,Decreased mobility,Decreased strength  Visit Diagnosis: Pain in thoracic spine  Muscle spasm of back  Muscle weakness (generalized)     Problem List Patient Active Problem List   Diagnosis Date Noted  . Peripheral neuropathy due to chemotherapy ([place]) 12/11/2020  . Elevated blood pressure reading 06/11/2020  . Ventral hernia without obstruction or gangrene 06/11/2020  . Leg edema 08/09/2019  . Rash 08/09/2019  . Dyslipidemia 04/06/2019  . OSA (obstructive sleep apnea) 07/16/2018  . Erectile dysfunction 07/16/2018  . BPH 07/16/2018  . Frequent headaches 07/16/2018  . CAD s/p CABG 07/16/2018  . Liver transplant recipient [facility]) 11/01/2015    Lyndee Hensen, PT, DPT 12:01 PM  12/14/20    [place], [place], [address] Phone: [phone]   Fax:  [phone]  Name: Brandon [name] MRN: [id number] Date of Birth: 01-20-1951

## 2020-12-17 ENCOUNTER — Encounter: Payer: Self-pay | Admitting: Family Medicine

## 2020-12-18 ENCOUNTER — Other Ambulatory Visit: Payer: Self-pay

## 2020-12-18 ENCOUNTER — Encounter: Payer: Self-pay | Admitting: Physical Therapy

## 2020-12-18 ENCOUNTER — Ambulatory Visit (INDEPENDENT_AMBULATORY_CARE_PROVIDER_SITE_OTHER): Payer: Medicare Other | Admitting: Physical Therapy

## 2020-12-18 DIAGNOSIS — M6281 Muscle weakness (generalized): Secondary | ICD-10-CM

## 2020-12-18 DIAGNOSIS — M546 Pain in thoracic spine: Secondary | ICD-10-CM

## 2020-12-18 DIAGNOSIS — M62830 Muscle spasm of back: Secondary | ICD-10-CM

## 2020-12-18 NOTE — Telephone Encounter (Signed)
Please advise 

## 2020-12-18 NOTE — Therapy (Signed)
[facility] [address] [place], [place], [address] Phone: [phone]   Fax:  [phone]  Physical Therapy Treatment  Patient Details  Name: Brandon [name] MRN: [id number] Date of Birth: 02-07-1951 Referring Provider (PT): Dimas Chyle   Encounter Date: 12/18/2020   PT End of Session - 12/18/20 0926    Visit Number 2    Number of Visits 12    Date for PT Re-Evaluation 02/08/21    Authorization Type Medicare    PT Start Time 0833    PT Stop Time 0910    PT Time Calculation (min) 37 min    Activity Tolerance Patient tolerated treatment well    Behavior During Therapy [facility] for tasks assessed/performed           Past Medical History:  Diagnosis Date  . BPH (benign prostatic hyperplasia)   . Cancer [facility])    Hepatic  . Coronary artery disease   . Hyperlipidemia   . Hypertension   . Liver failure ([place])    Cirrhosis- alcoholic- Liver transplant 2017  . Pneumonia    as a kid    Past Surgical History:  Procedure Laterality Date  . COLONOSCOPY W/ POLYPECTOMY    . CORONARY ARTERY BYPASS GRAFT  07/2009  . LIVER TRANSPLANT  2017  . ORIF CLAVICULAR FRACTURE Left 04/29/2019   Procedure: OPEN REDUCTION INTERNAL FIXATION (ORIF) CLAVICULAR FRACTURE;  Surgeon: Altamese [place], MD;  Location: [facility];  Service: Orthopedics;  Laterality: Left;    There were no vitals filed for this visit.   Subjective Assessment - 12/18/20 0917    Subjective Pt reports soreness in R thoracic spine and scapular region due to exercising and doing yard work over the weekend.    Pertinent History liver transplant, hernia, CABG surgery    Limitations Lifting;Walking;House hold activities    Patient Stated Goals Decreased pain.    Currently in Pain? Yes    Pain Score 5     Pain Location Shoulder    Pain Orientation Right    Pain Descriptors / Indicators Aching    Pain Type Acute pain    Pain Onset More than a month ago    Pain Frequency Intermittent     Effect of Pain on Daily Activities able to golf with friends and do yard work over the weekend with his wife    Multiple Pain Sites Yes    Pain Score 6    Pain Location Abdomen    Pain Orientation Right;Left    Pain Descriptors / Indicators Tightness    Pain Type Acute pain    Pain Onset More than a month ago    Pain Frequency Intermittent                             [facility] Adult PT Treatment/Exercise - 12/18/20 0001      Lumbar Exercises: Stretches   Standing Side Bend 3 reps;30 seconds    Standing Side Bend Limitations QL stretch at wall    Other Lumbar Stretch Exercise Supine shoulder flexion with cane x 10 for lat stretch      Lumbar Exercises: Standing   Scapular Retraction 5 reps    Row 20 reps    Theraband Level (Row) Level 3 (Green)    Other Standing Lumbar Exercises Paloff press BTB x 20;      Lumbar Exercises: Supine   Ab Set 10 reps    Clam 15 reps  Clam Limitations GTB    Bent Knee Raise 15 reps    Bent Knee Raise Limitations GTB    Dead Bug 20 reps    Dead Bug Limitations GTB      Manual Therapy   Joint Mobilization Thoracic PA mobs    Soft tissue mobilization STM/TPR to R thoracic paraspinals and rhomboid                    PT Short Term Goals - 12/14/20 1000      PT SHORT TERM GOAL #1   Title Pt will be able to perform initial HEP independently    Time 2    Period Weeks    Status New    Target Date 12/28/20             PT Long Term Goals - 12/14/20 1001      PT LONG TERM GOAL #1   Title Pt will be able to perform functional activities with optimal posture and minimal cueing for posture    Baseline Pt sits with forward head posture and rounded shoulders. Can correct posture when cued.    Time 8    Period Weeks    Status New    Target Date 02/08/21      PT LONG TERM GOAL #2   Title Pt will report pain at 2/10 in thoracic region when performing reaching activies    Time 8    Period Weeks    Status New     Target Date 02/08/21      PT LONG TERM GOAL #3   Title Pt will report pain between 0-2/10 in right scapular region when performing functional activites and after taking a long walk    Time 8    Period Weeks    Status New    Target Date 02/08/21      PT LONG TERM GOAL #4   Title Pt will be able to perform final HEP independently    Time 8    Period Weeks    Status New    Target Date 02/08/21                 Plan - 12/18/20 7371    Clinical Impression Statement Pt demonstrated improvement in ability for core and upper extremity strengthening exercises, and was able to progress light strengthening without pain.  Manual therapy was utilized to treat continued soreness in pts rhomboid and thoracic paraspinal musculature. Pt was eductaed on importance of core stability while performing exercises at the gym to improve postural stabilty and prevent soreness in lattisimus dorsi and scapular musculature. Plan to progress mobility and strength as tolerated.    Personal Factors and Comorbidities Past/Current Experience;Time since onset of injury/illness/exacerbation    Examination-Activity Limitations Bend;Reach Overhead;Carry;Lift    Examination-Participation Restrictions Cleaning;Community Activity;Yard Work    Stability/Clinical Decision Making Stable/Uncomplicated    Clinical Decision Making Low    Rehab Potential Good    PT Frequency 2x / week    PT Duration 8 weeks    PT Treatment/Interventions ADLs/Self Care Home Management;Cryotherapy;Electrical Stimulation;Iontophoresis 4mg /ml Dexamethasone;Moist Heat;Traction;Ultrasound;DME Instruction;Gait training;Neuromuscular re-education;Balance training;Therapeutic exercise;Therapeutic activities;Functional mobility training;Stair training;Patient/family education;Manual techniques;Passive range of motion;Dry needling;Taping;Joint Manipulations;Spinal Manipulations    PT Home Exercise Plan [email or web address]    Consulted and Agree with Plan of Care  Patient           Patient will benefit from skilled therapeutic intervention in order to improve the following deficits and impairments:  Decreased  range of motion,Increased muscle spasms,Decreased activity tolerance,Pain,Impaired flexibility,Improper body mechanics,Decreased mobility,Decreased strength  Visit Diagnosis: Pain in thoracic spine  Muscle spasm of back  Muscle weakness (generalized)     Problem List Patient Active Problem List   Diagnosis Date Noted  . Peripheral neuropathy due to chemotherapy ([place]) 12/11/2020  . Elevated blood pressure reading 06/11/2020  . Ventral hernia without obstruction or gangrene 06/11/2020  . Leg edema 08/09/2019  . Rash 08/09/2019  . Dyslipidemia 04/06/2019  . OSA (obstructive sleep apnea) 07/16/2018  . Erectile dysfunction 07/16/2018  . BPH 07/16/2018  . Frequent headaches 07/16/2018  . CAD s/p CABG 07/16/2018  . Liver transplant recipient [facility]) 11/01/2015    Leighton Parody SPT 12/18/2020  [facility] [address] [place], [place], [address] Phone: [phone]   Fax:  [phone]  Name: Shamir Tuzzolino MRN: [id number] Date of Birth: 04-Dec-1950   This entire session was performed under direct supervision and direction of a licensed therapist/therapist assistant . I have personally read, edited and approve of the note as written. Lyndee Hensen, PT, DPT 9:43 AM  12/18/20

## 2020-12-20 ENCOUNTER — Encounter: Payer: Self-pay | Admitting: Physical Therapy

## 2020-12-20 ENCOUNTER — Ambulatory Visit (INDEPENDENT_AMBULATORY_CARE_PROVIDER_SITE_OTHER): Payer: Medicare Other | Admitting: Physical Therapy

## 2020-12-20 ENCOUNTER — Other Ambulatory Visit: Payer: Self-pay

## 2020-12-20 DIAGNOSIS — M6281 Muscle weakness (generalized): Secondary | ICD-10-CM

## 2020-12-20 DIAGNOSIS — M62830 Muscle spasm of back: Secondary | ICD-10-CM

## 2020-12-20 DIAGNOSIS — M546 Pain in thoracic spine: Secondary | ICD-10-CM

## 2020-12-20 NOTE — Therapy (Signed)
[facility] [address] [place], [place], [address] Phone: [phone]   Fax:  [phone]  Physical Therapy Treatment  Patient Details  Name: Brandon [name] MRN: [id number] Date of Birth: March 26, 1951 Referring Provider (PT): Dimas Chyle   Encounter Date: 12/20/2020   PT End of Session - 12/20/20 0923    Visit Number 3    Number of Visits 12    Date for PT Re-Evaluation 02/08/21    Authorization Type Medicare    PT Start Time 0803    PT Stop Time 2542    PT Time Calculation (min) 41 min    Activity Tolerance Patient tolerated treatment well    Behavior During Therapy [facility] for tasks assessed/performed           Past Medical History:  Diagnosis Date  . BPH (benign prostatic hyperplasia)   . Cancer [facility])    Hepatic  . Coronary artery disease   . Hyperlipidemia   . Hypertension   . Liver failure ([place])    Cirrhosis- alcoholic- Liver transplant 2017  . Pneumonia    as a kid    Past Surgical History:  Procedure Laterality Date  . COLONOSCOPY W/ POLYPECTOMY    . CORONARY ARTERY BYPASS GRAFT  07/2009  . LIVER TRANSPLANT  2017  . ORIF CLAVICULAR FRACTURE Left 04/29/2019   Procedure: OPEN REDUCTION INTERNAL FIXATION (ORIF) CLAVICULAR FRACTURE;  Surgeon: Altamese [place], MD;  Location: [facility];  Service: Orthopedics;  Laterality: Left;    There were no vitals filed for this visit.   Subjective Assessment - 12/20/20 0915    Subjective Pt reports soreness in R thoracic region due to performing yard work that prevented him from being able to perform HEP.    Limitations Lifting;Walking;House hold activities    Patient Stated Goals Decreased pain.    Currently in Pain? Yes    Pain Score 5    Pain is 0/10 at rest but 5/10 with activity   Pain Location Shoulder    Pain Orientation Right    Pain Descriptors / Indicators Aching    Pain Type Acute pain    Pain Onset More than a month ago    Pain Frequency Intermittent    Aggravating  Factors  walking his dog for 2 miles    Multiple Pain Sites Yes    Pain Score 5    Pain Location Abdomen    Pain Orientation Right;Left    Pain Descriptors / Indicators Tightness    Pain Type Acute pain    Pain Onset More than a month ago    Pain Frequency Intermittent    Aggravating Factors  Reaching inferiorly and rotating his spine                             [facility] Adult PT Treatment/Exercise - 12/20/20 0821      Lumbar Exercises: Stretches   Standing Side Bend 3 reps;30 seconds    Standing Side Bend Limitations QL stretch at wall    Other Lumbar Stretch Exercise Supine shoulder flexion with cane x 10 for lat stretch      Lumbar Exercises: Standing   Scapular Retraction --    Row 20 reps    Theraband Level (Row) Level 3 (Green)    Other Standing Lumbar Exercises Wall push ups 2 x 10;    Other Standing Lumbar Exercises Paloff press BTB x 20;      Lumbar Exercises:  Supine   Ab Set 10 reps    Clam 15 reps    Clam Limitations GTB    Bent Knee Raise 15 reps    Bent Knee Raise Limitations GTB    Dead Bug --    Dead Bug Limitations --    Bridge with clamshell 20 reps    Straight Leg Raise 20 reps    Straight Leg Raises Limitations with TA    Other Supine Lumbar Exercises Horizontal abd RTB x 20 with TA;    Other Supine Lumbar Exercises --      Manual Therapy   Joint Mobilization Thoracic PA mobs    Soft tissue mobilization STM/TPR to R thoracic paraspinals and rhomboid                  PT Education - 12/20/20 0922    Education Details Reviwed HEP    Person(s) Educated Patient    Methods Explanation;Demonstration;Tactile cues;Verbal cues;Handout    Comprehension Verbalized understanding;Returned demonstration;Verbal cues required;Tactile cues required            PT Short Term Goals - 12/14/20 1000      PT SHORT TERM GOAL #1   Title Pt will be able to perform initial HEP independently    Time 2    Period Weeks    Status New    Target  Date 12/28/20             PT Long Term Goals - 12/14/20 1001      PT LONG TERM GOAL #1   Title Pt will be able to perform functional activities with optimal posture and minimal cueing for posture    Baseline Pt sits with forward head posture and rounded shoulders. Can correct posture when cued.    Time 8    Period Weeks    Status New    Target Date 02/08/21      PT LONG TERM GOAL #2   Title Pt will report pain at 2/10 in thoracic region when performing reaching activies    Time 8    Period Weeks    Status New    Target Date 02/08/21      PT LONG TERM GOAL #3   Title Pt will report pain between 0-2/10 in right scapular region when performing functional activites and after taking a long walk    Time 8    Period Weeks    Status New    Target Date 02/08/21      PT LONG TERM GOAL #4   Title Pt will be able to perform final HEP independently    Time 8    Period Weeks    Status New    Target Date 02/08/21                 Plan - 12/20/20 0925    Clinical Impression Statement Pt demonstrated improvement since previous visit with transverse abdominus contractions and progressed to performing glute bridge exercises .Pts shoulder pain is still agravated with heavier activity around the house, but pt was able to perform therapeutic exercises without increased pain. Pts bilateral thoracic paraspinals and rhomboids are tight and STM was performed to relieve pain. Pt was educated on good posture for reduced pain in scapular region. Plan to progress core and scapular stabilizer strengthening exercises.    Personal Factors and Comorbidities Past/Current Experience;Time since onset of injury/illness/exacerbation    Examination-Activity Limitations Bend;Reach Overhead;Carry;Lift    Examination-Participation Restrictions Cleaning;Community Activity;[employer] Work  Stability/Clinical Decision Making Stable/Uncomplicated    Rehab Potential Good    PT Frequency 2x / week    PT Duration 8  weeks    PT Treatment/Interventions ADLs/Self Care Home Management;Cryotherapy;Electrical Stimulation;Iontophoresis 4mg /ml Dexamethasone;Moist Heat;Traction;Ultrasound;DME Instruction;Gait training;Neuromuscular re-education;Balance training;Therapeutic exercise;Therapeutic activities;Functional mobility training;Stair training;Patient/family education;Manual techniques;Passive range of motion;Dry needling;Taping;Joint Manipulations;Spinal Manipulations    PT Next Visit Plan Perform scapular stabilizer strengthing. Continue to oberserve pt posture and educate on importance of posture for reduced pain in thoracic region,  practice lifting mechanics.    PT Home Exercise Plan [email or web address]    Consulted and Agree with Plan of Care Patient           Patient will benefit from skilled therapeutic intervention in order to improve the following deficits and impairments:  Decreased range of motion,Increased muscle spasms,Decreased activity tolerance,Pain,Impaired flexibility,Improper body mechanics,Decreased mobility,Decreased strength  Visit Diagnosis: Pain in thoracic spine  Muscle spasm of back  Muscle weakness (generalized)     Problem List Patient Active Problem List   Diagnosis Date Noted  . Peripheral neuropathy due to chemotherapy ([place]) 12/11/2020  . Elevated blood pressure reading 06/11/2020  . Ventral hernia without obstruction or gangrene 06/11/2020  . Leg edema 08/09/2019  . Rash 08/09/2019  . Dyslipidemia 04/06/2019  . OSA (obstructive sleep apnea) 07/16/2018  . Erectile dysfunction 07/16/2018  . BPH 07/16/2018  . Frequent headaches 07/16/2018  . CAD s/p CABG 07/16/2018  . Liver transplant recipient [facility]) 11/01/2015    Leighton Parody SPT 12/20/2020  [facility] [address] [place], [place], [address] Phone: [phone]   Fax:  [phone]  Name: Deondre Marinaro MRN: [id number] Date of Birth: 02-23-1951   This entire session was  performed under direct supervision and direction of a licensed therapist/therapist assistant . I have personally read, edited and approve of the note as written.  Lyndee Hensen, PT, DPT 3:25 PM  12/20/20

## 2020-12-26 ENCOUNTER — Other Ambulatory Visit: Payer: Self-pay

## 2020-12-26 ENCOUNTER — Encounter: Payer: Self-pay | Admitting: Physical Therapy

## 2020-12-26 ENCOUNTER — Ambulatory Visit (INDEPENDENT_AMBULATORY_CARE_PROVIDER_SITE_OTHER): Payer: Medicare Other | Admitting: Physical Therapy

## 2020-12-26 DIAGNOSIS — M62830 Muscle spasm of back: Secondary | ICD-10-CM | POA: Diagnosis not present

## 2020-12-26 DIAGNOSIS — M6281 Muscle weakness (generalized): Secondary | ICD-10-CM | POA: Diagnosis not present

## 2020-12-26 DIAGNOSIS — M546 Pain in thoracic spine: Secondary | ICD-10-CM

## 2020-12-26 NOTE — Patient Instructions (Signed)
Access Code: [email or web address] URL: [email or web address] Date: 12/26/2020 Prepared by: Lyndee Hensen  Exercises Seated Scapular Retraction - 1 x daily - 2 sets - 10 reps Supine Shoulder Flexion with Dowel - 1 x daily - 1 sets - 10 reps Supine March - 1 x daily - 2 sets - 10 reps Supine Bridge - 1 x daily - 2 sets - 10 reps Standing Shoulder Posterior Capsule Stretch - 2 x daily - 3 reps - 30 hold Standing Row with Anchored Resistance - 1 x daily - 2 sets - 10 reps Standing Anti-Rotation Press with Anchored Resistance - 1 x daily - 2 sets - 10 reps Standing Sidebending with Chair Support - 2 x daily - 3 reps - 30 hold Standing Shoulder Horizontal Abduction with Resistance - 1 x daily - 2 sets - 10 reps Shoulder External Rotation with Anchored Resistance - 1 x daily - 2 sets - 10 reps Standing Shoulder Internal Rotation with Anchored Resistance - 1 x daily - 2 sets - 10 reps Seated Hamstring Stretch - 2 x daily - 3 reps - 30 hold Supine Piriformis Stretch with Foot on Ground - 2 x daily - 3 reps - 30 hold

## 2020-12-26 NOTE — Therapy (Signed)
[facility] [address] [place], [place], [address] Phone: [phone]   Fax:  [phone]  Physical Therapy Treatment  Patient Details  Name: Brandon [name] MRN: [id number] Date of Birth: 25-Feb-1951 Referring Provider (PT): Dimas Chyle   Encounter Date: 12/26/2020   PT End of Session - 12/26/20 1114    Visit Number 4    Number of Visits 12    Date for PT Re-Evaluation 02/08/21    Authorization Type Medicare    PT Start Time 7026    PT Stop Time 0935    PT Time Calculation (min) 48 min    Activity Tolerance Patient tolerated treatment well    Behavior During Therapy [facility] for tasks assessed/performed           Past Medical History:  Diagnosis Date  . BPH (benign prostatic hyperplasia)   . Cancer [facility])    Hepatic  . Coronary artery disease   . Hyperlipidemia   . Hypertension   . Liver failure ([place])    Cirrhosis- alcoholic- Liver transplant 2017  . Pneumonia    as a kid    Past Surgical History:  Procedure Laterality Date  . COLONOSCOPY W/ POLYPECTOMY    . CORONARY ARTERY BYPASS GRAFT  07/2009  . LIVER TRANSPLANT  2017  . ORIF CLAVICULAR FRACTURE Left 04/29/2019   Procedure: OPEN REDUCTION INTERNAL FIXATION (ORIF) CLAVICULAR FRACTURE;  Surgeon: Altamese [place], MD;  Location: [facility];  Service: Orthopedics;  Laterality: Left;    There were no vitals filed for this visit.   Subjective Assessment - 12/26/20 0847    Subjective Pt reports reduced shoulder pain. Pt reports lattissumus dorsi stretching helps relieve stiffness in AM. Pt reports he reduced the amount of weight he lifts at the gym to 30 lbs for 8 sets of 10 vs 100 lbs for 3-4 reps.    Pertinent History liver transplant, hernia, CABG surgery    Limitations Lifting;Walking;House hold activities    Patient Stated Goals Decreased pain.    Currently in Pain? Yes    Pain Score 5     Pain Location Shoulder    Pain Orientation Right    Pain Descriptors / Indicators  Aching    Pain Type Acute pain    Pain Onset More than a month ago    Pain Frequency Intermittent    Aggravating Factors  stiff in AM, walking the dog in the AM    Multiple Pain Sites Yes    Pain Score 5    Pain Location Abdomen    Pain Orientation Right;Left    Pain Descriptors / Indicators Tightness    Pain Type Acute pain    Pain Onset More than a month ago    Pain Frequency Intermittent    Aggravating Factors  reaching to put socks on                             [facility] Adult PT Treatment/Exercise - 12/26/20 0001      Lumbar Exercises: Stretches   Active Hamstring Stretch 2 reps;30 seconds    Active Hamstring Stretch Limitations seated and with chair in standing    Standing Side Bend 3 reps;30 seconds    Standing Side Bend Limitations QL stretch at wall    Piriformis Stretch 2 reps;30 seconds    Piriformis Stretch Limitations bilateral in supine- pt request for exercise    Other Lumbar Stretch Exercise Supine shoulder flexion with  cane x 10 for lat stretch      Lumbar Exercises: Standing   Row 20 reps    Theraband Level (Row) Level 3 (Green)    Other Standing Lumbar Exercises Wall push ups 2 x 10; horizantal abduction x10 with RTB    Other Standing Lumbar Exercises Paloff press BTB x 20; ER x 10 bil GTB; IR x 10 bil with GTB      Lumbar Exercises: Supine   Ab Set --    Clam 15 reps    Clam Limitations GTB    Bent Knee Raise --    Bent Knee Raise Limitations --    Bridge with clamshell 20 reps    Straight Leg Raise 20 reps    Straight Leg Raises Limitations with TA    Other Supine Lumbar Exercises --      Manual Therapy   Joint Mobilization --    Soft tissue mobilization --                  PT Education - 12/26/20 1110    Education Details Pt was educated on decreasing weight, number of sets and reps to perform at the gym for strengthening without increasing shoulder pain    Person(s) Educated Patient    Methods  Explanation;Demonstration;Tactile cues;Verbal cues    Comprehension Verbalized understanding;Returned demonstration;Verbal cues required;Tactile cues required;Need further instruction            PT Short Term Goals - 12/26/20 1121      PT SHORT TERM GOAL #1   Title Pt will be able to perform initial HEP independently    Time 2    Period Weeks    Status Achieved    Target Date 12/28/20             PT Long Term Goals - 12/14/20 1001      PT LONG TERM GOAL #1   Title Pt will be able to perform functional activities with optimal posture and minimal cueing for posture    Baseline Pt sits with forward head posture and rounded shoulders. Can correct posture when cued.    Time 8    Period Weeks    Status New    Target Date 02/08/21      PT LONG TERM GOAL #2   Title Pt will report pain at 2/10 in thoracic region when performing reaching activies    Time 8    Period Weeks    Status New    Target Date 02/08/21      PT LONG TERM GOAL #3   Title Pt will report pain between 0-2/10 in right scapular region when performing functional activites and after taking a long walk    Time 8    Period Weeks    Status New    Target Date 02/08/21      PT LONG TERM GOAL #4   Title Pt will be able to perform final HEP independently    Time 8    Period Weeks    Status New    Target Date 02/08/21                 Plan - 12/26/20 1114    Clinical Impression Statement Pt progressed to performing more UE rotation strengthening exercises without increased pain. Pt enjoys going to the gym for strength training but may be performing too many sets and repetitions and aggravating his shoulder pain. Pt was educated on proper form and number of sets  and reps to perform at the gym to continue to progress strength training without increased pain. PT to benefit from continued strengthening and stabilization.    Personal Factors and Comorbidities Past/Current Experience;Time since onset of  injury/illness/exacerbation    Examination-Activity Limitations Bend;Reach Overhead;Carry;Lift    Examination-Participation Restrictions Cleaning;Community Activity;Yard Work    Stability/Clinical Decision Making Stable/Uncomplicated    Rehab Potential Good    PT Frequency 1x / week    PT Duration 8 weeks    PT Treatment/Interventions ADLs/Self Care Home Management;Cryotherapy;Electrical Stimulation;Iontophoresis 4mg /ml Dexamethasone;Moist Heat;Traction;Ultrasound;DME Instruction;Gait training;Neuromuscular re-education;Balance training;Therapeutic exercise;Therapeutic activities;Functional mobility training;Stair training;Patient/family education;Manual techniques;Passive range of motion;Dry needling;Taping;Joint Manipulations;Spinal Manipulations    PT Next Visit Plan Plan to perform thoracic mobs and STM for thoracic paraspinals and rhomboids.    PT Home Exercise Plan [id number]    Consulted and Agree with Plan of Care Patient           Patient will benefit from skilled therapeutic intervention in order to improve the following deficits and impairments:  Decreased range of motion,Increased muscle spasms,Decreased activity tolerance,Pain,Impaired flexibility,Improper body mechanics,Decreased mobility,Decreased strength  Visit Diagnosis: Pain in thoracic spine  Muscle spasm of back  Muscle weakness (generalized)     Problem List Patient Active Problem List   Diagnosis Date Noted  . Peripheral neuropathy due to chemotherapy ([place]) 12/11/2020  . Elevated blood pressure reading 06/11/2020  . Ventral hernia without obstruction or gangrene 06/11/2020  . Leg edema 08/09/2019  . Rash 08/09/2019  . Dyslipidemia 04/06/2019  . OSA (obstructive sleep apnea) 07/16/2018  . Erectile dysfunction 07/16/2018  . BPH 07/16/2018  . Frequent headaches 07/16/2018  . CAD s/p CABG 07/16/2018  . Liver transplant recipient [facility]) 11/01/2015    Leighton Parody SPT 12/26/2020  This entire session was  performed under direct supervision and direction of a licensed therapist/therapist assistant . I have personally read, edited and approve of the note as written.  Lyndee Hensen, PT, DPT 1:18 PM  12/26/20    [place], [place], [address] Phone: [phone]   Fax:  [phone]  Name: Brandon [name] MRN: [id number] Date of Birth: 12/04/1950

## 2021-01-01 ENCOUNTER — Encounter: Payer: Self-pay | Admitting: Physical Therapy

## 2021-01-01 ENCOUNTER — Ambulatory Visit (INDEPENDENT_AMBULATORY_CARE_PROVIDER_SITE_OTHER): Payer: Medicare Other | Admitting: Physical Therapy

## 2021-01-01 ENCOUNTER — Other Ambulatory Visit: Payer: Self-pay

## 2021-01-01 DIAGNOSIS — M6281 Muscle weakness (generalized): Secondary | ICD-10-CM | POA: Diagnosis not present

## 2021-01-01 DIAGNOSIS — M546 Pain in thoracic spine: Secondary | ICD-10-CM

## 2021-01-01 DIAGNOSIS — M62830 Muscle spasm of back: Secondary | ICD-10-CM

## 2021-01-01 NOTE — Therapy (Signed)
[facility] [address] [place], [place], [address] Phone: [phone]   Fax:  [phone]  Physical Therapy Treatment  Patient Details  Name: Brandon [name] MRN: [id number] Date of Birth: Jul 11, 1951 Referring Provider (PT): Dimas Chyle   Encounter Date: 01/01/2021   PT End of Session - 01/01/21 0943    Visit Number 5    Number of Visits 12    Date for PT Re-Evaluation 02/08/21    Authorization Type Medicare    PT Start Time 0845    PT Stop Time 0933    PT Time Calculation (min) 48 min    Activity Tolerance Patient tolerated treatment well    Behavior During Therapy [facility] for tasks assessed/performed           Past Medical History:  Diagnosis Date  . BPH (benign prostatic hyperplasia)   . Cancer [facility])    Hepatic  . Coronary artery disease   . Hyperlipidemia   . Hypertension   . Liver failure ([place])    Cirrhosis- alcoholic- Liver transplant 2017  . Pneumonia    as a kid    Past Surgical History:  Procedure Laterality Date  . COLONOSCOPY W/ POLYPECTOMY    . CORONARY ARTERY BYPASS GRAFT  07/2009  . LIVER TRANSPLANT  2017  . ORIF CLAVICULAR FRACTURE Left 04/29/2019   Procedure: OPEN REDUCTION INTERNAL FIXATION (ORIF) CLAVICULAR FRACTURE;  Surgeon: Altamese Spring Ridge, MD;  Location: [facility];  Service: Orthopedics;  Laterality: Left;    There were no vitals filed for this visit.   Subjective Assessment - 01/01/21 0845    Subjective Pt reports pain is worse in AM but improves after performing supine lat dorsi stretch with dowel.    Pertinent History liver transplant, hernia, CABG surgery    Limitations Lifting;Walking;House hold activities    Patient Stated Goals Decreased pain.    Currently in Pain? Yes    Pain Score 5     Pain Location Shoulder    Pain Orientation Right    Pain Descriptors / Indicators Aching    Pain Type Acute pain    Pain Onset More than a month ago    Pain Frequency Intermittent    Aggravating  Factors  pain and stiffness in AM    Pain Relieving Factors stretch for lat dorsi with dowel    Multiple Pain Sites Yes    Pain Score 5    Pain Location Abdomen    Pain Orientation Right;Left    Pain Descriptors / Indicators Tightness    Pain Type Acute pain    Pain Onset More than a month ago    Pain Frequency Intermittent    Aggravating Factors  reaching to lift objects    Pain Relieving Factors Getting into a kneeling position to reach for objects on the ground                             [facility] Adult PT Treatment/Exercise - 01/01/21 0001      Lumbar Exercises: Stretches   Active Hamstring Stretch --    Active Hamstring Stretch Limitations --    Standing Side Bend --    Standing Side Bend Limitations --    Piriformis Stretch --    Piriformis Stretch Limitations --    Other Lumbar Stretch Exercise flexion pulleys 2 min    Other Lumbar Stretch Exercise Supine shoulder flexion with cane x 10 for lat stretch  Lumbar Exercises: Standing   Functional Squats 10 reps    Row 20 reps    Theraband Level (Row) Level 4 (Blue)    Other Standing Lumbar Exercises Wall push ups 2 x 10; horizantal abduction x10 with RTB    Other Standing Lumbar Exercises Paloff press BTB x 20; ER x 10 bil GTB; IR x 10 bil with GTB      Lumbar Exercises: Supine   Clam 15 reps    Clam Limitations GTB    Bridge with clamshell 20 reps    Straight Leg Raise 20 reps    Straight Leg Raises Limitations with TA    Other Supine Lumbar Exercises alternating clam shell x 10      Manual Therapy   Manual Therapy Joint mobilization;Soft tissue mobilization    Joint Mobilization Thoracic PA mobs    Soft tissue mobilization STM/TPR to R thoracic paraspinals and rhomboid                    PT Short Term Goals - 12/26/20 1121      PT SHORT TERM GOAL #1   Title Pt will be able to perform initial HEP independently    Time 2    Period Weeks    Status Achieved    Target Date 12/28/20              PT Long Term Goals - 12/14/20 1001      PT LONG TERM GOAL #1   Title Pt will be able to perform functional activities with optimal posture and minimal cueing for posture    Baseline Pt sits with forward head posture and rounded shoulders. Can correct posture when cued.    Time 8    Period Weeks    Status New    Target Date 02/08/21      PT LONG TERM GOAL #2   Title Pt will report pain at 2/10 in thoracic region when performing reaching activies    Time 8    Period Weeks    Status New    Target Date 02/08/21      PT LONG TERM GOAL #3   Title Pt will report pain between 0-2/10 in right scapular region when performing functional activites and after taking a long walk    Time 8    Period Weeks    Status New    Target Date 02/08/21      PT LONG TERM GOAL #4   Title Pt will be able to perform final HEP independently    Time 8    Period Weeks    Status New    Target Date 02/08/21                 Plan - 01/01/21 0944    Clinical Impression Statement Pt was able to perform therapuetic exercises with greater resistance without an increase in pain. Pt requires min cueing for proper form and to contract TA musculature during exercises. Pt continues to have pain and stiffness in AM, but is able to relieve pain with stretches in HEP. Pt continues to have pain with functional reaching to lift objects located inferiorly. Plan to practice functional squatting and lifting to improve lifting mechanics and reduce pain.    Personal Factors and Comorbidities Past/Current Experience;Time since onset of injury/illness/exacerbation    Examination-Activity Limitations Bend;Reach Overhead;Carry;Lift    Examination-Participation Restrictions Cleaning;Community Activity;Yard Work    Stability/Clinical Decision Making Stable/Uncomplicated    Rehab Potential Good  PT Frequency 1x / week    PT Duration 8 weeks    PT Treatment/Interventions ADLs/Self Care Home  Management;Cryotherapy;Electrical Stimulation;Iontophoresis 4mg /ml Dexamethasone;Moist Heat;Traction;Ultrasound;DME Instruction;Gait training;Neuromuscular re-education;Balance training;Therapeutic exercise;Therapeutic activities;Functional mobility training;Stair training;Patient/family education;Manual techniques;Passive range of motion;Dry needling;Taping;Joint Manipulations;Spinal Manipulations    PT Next Visit Plan Plan to practice squatting and reaching inferiorly to lift dumbell    PT Home Exercise Plan [id number]    Consulted and Agree with Plan of Care Patient           Patient will benefit from skilled therapeutic intervention in order to improve the following deficits and impairments:  Decreased range of motion,Increased muscle spasms,Decreased activity tolerance,Pain,Impaired flexibility,Improper body mechanics,Decreased mobility,Decreased strength  Visit Diagnosis: Pain in thoracic spine  Muscle spasm of back  Muscle weakness (generalized)     Problem List Patient Active Problem List   Diagnosis Date Noted  . Peripheral neuropathy due to chemotherapy ([place]) 12/11/2020  . Elevated blood pressure reading 06/11/2020  . Ventral hernia without obstruction or gangrene 06/11/2020  . Leg edema 08/09/2019  . Rash 08/09/2019  . Dyslipidemia 04/06/2019  . OSA (obstructive sleep apnea) 07/16/2018  . Erectile dysfunction 07/16/2018  . BPH 07/16/2018  . Frequent headaches 07/16/2018  . CAD s/p CABG 07/16/2018  . Liver transplant recipient [facility]) 11/01/2015   Leighton Parody SPT 01/01/2021, 9:49 AM   Lyndee Hensen, PT, DPT 11:35 AM  01/01/21   This entire session was performed under direct supervision and direction of a licensed therapist/therapist assistant . I have personally read, edited and approve of the note as written.    [facility] [address] [place], [place], [address] Phone: [phone]   Fax:  [phone]  Name:  Otniel Hoe MRN: [id number] Date of Birth: [birth year]

## 2021-01-10 ENCOUNTER — Encounter: Payer: Self-pay | Admitting: Physical Therapy

## 2021-01-10 ENCOUNTER — Encounter: Payer: Self-pay | Admitting: Family Medicine

## 2021-01-10 ENCOUNTER — Ambulatory Visit (INDEPENDENT_AMBULATORY_CARE_PROVIDER_SITE_OTHER): Payer: Medicare Other | Admitting: Physical Therapy

## 2021-01-10 ENCOUNTER — Other Ambulatory Visit: Payer: Self-pay

## 2021-01-10 DIAGNOSIS — M546 Pain in thoracic spine: Secondary | ICD-10-CM | POA: Diagnosis not present

## 2021-01-10 DIAGNOSIS — M62830 Muscle spasm of back: Secondary | ICD-10-CM

## 2021-01-10 DIAGNOSIS — M6281 Muscle weakness (generalized): Secondary | ICD-10-CM | POA: Diagnosis not present

## 2021-01-10 NOTE — Telephone Encounter (Signed)
See note

## 2021-01-10 NOTE — Therapy (Addendum)
[facility] [address] [place], [place], [address] Phone: [phone]   Fax:  [phone]  Physical Therapy Treatment  Patient Details  Name: Brandon [name] MRN: [id number] Date of Birth: [birth year] Referring Provider (PT): Dimas Chyle   Encounter Date: 01/10/2021   PT End of Session - 01/10/21 1141     Visit Number 6    Number of Visits 12    Date for PT Re-Evaluation 02/08/21    Authorization Type Medicare    PT Start Time 1010    PT Stop Time 1100    PT Time Calculation (min) 50 min    Activity Tolerance Patient tolerated treatment well    Behavior During Therapy [facility] for tasks assessed/performed             Past Medical History:  Diagnosis Date   BPH (benign prostatic hyperplasia)    Cancer ([place])    Hepatic   Coronary artery disease    Hyperlipidemia    Hypertension    Liver failure ([place])    Cirrhosis- alcoholic- Liver transplant 2017   Pneumonia    as a kid    Past Surgical History:  Procedure Laterality Date   COLONOSCOPY W/ POLYPECTOMY     CORONARY ARTERY BYPASS GRAFT  07/2009   LIVER TRANSPLANT  2017   ORIF CLAVICULAR FRACTURE Left 04/29/2019   Procedure: OPEN REDUCTION INTERNAL FIXATION (ORIF) CLAVICULAR FRACTURE;  Surgeon: Altamese Stout, MD;  Location: [facility];  Service: Orthopedics;  Laterality: Left;    There were no vitals filed for this visit.   Subjective Assessment - 01/10/21 1009     Subjective Pt reports he is feeling well and only had pain in abdominal region twice in the past week. Pt reports he is going to schedule a visit with his doctor to discuss bil foot and calf pain due to peripheral neuropathy. Pts reports his calves ache constantly and he experiences tingling in his feet that travels up his calves.    Pertinent History liver transplant, hernia, CABG surgery    Limitations Lifting;Walking;House hold activities    Patient Stated Goals Decreased pain.    Currently in Pain? Yes    Pain  Score 2    6/10 if he doesnt stretch in AM   Pain Location Shoulder    Pain Orientation Right    Pain Descriptors / Indicators Aching    Pain Type Acute pain    Pain Onset More than a month ago    Pain Frequency Intermittent    Aggravating Factors  pain and stiffness in AM    Pain Relieving Factors stretch for lat dorsi with dowel    Multiple Pain Sites Yes    Pain Score 4   pain twice; 4/10 when it happens   Pain Location Abdomen    Pain Orientation Right;Left    Pain Descriptors / Indicators Tightness    Pain Type Acute pain    Pain Onset More than a month ago    Pain Frequency Intermittent                               [facility] Adult PT Treatment/Exercise - 01/10/21 0001       Lumbar Exercises: Stretches   Gastroc Stretch 20 seconds;2 reps    Other Lumbar Stretch Exercise --    Other Lumbar Stretch Exercise Supine shoulder flexion with cane x 10 for lat stretch  Lumbar Exercises: Standing   Functional Squats 10 reps    Row 20 reps    Theraband Level (Row) Level 4 (Blue)    Other Standing Lumbar Exercises Wall push ups 2 x 10; horizantal abduction x10 with RTB; wall angles x 10    Other Standing Lumbar Exercises Paloff press BTB x 20; ER x 10 bil GTB; IR x 10 bil with GTB; squat and lift 20 lb box x 10      Lumbar Exercises: Supine   Clam --    Clam Limitations --    Bridge with clamshell 20 reps    Straight Leg Raise 20 reps    Straight Leg Raises Limitations with TA    Other Supine Lumbar Exercises alternating clam shell x 20      Manual Therapy   Manual Therapy Joint mobilization;Soft tissue mobilization    Joint Mobilization Thoracic PA mobs    Soft tissue mobilization STM/TPR to R thoracic paraspinals and rhomboid                    PT Education - 01/10/21 1141     Education Details Pt instructed to perform single SLR vs double to reduce pressure on hernia.    Person(s) Educated Patient    Methods Explanation     Comprehension Verbalized understanding              PT Short Term Goals - 12/26/20 1121       PT SHORT TERM GOAL #1   Title Pt will be able to perform initial HEP independently    Time 2    Period Weeks    Status Achieved    Target Date 12/28/20               PT Long Term Goals - 12/14/20 1001       PT LONG TERM GOAL #1   Title Pt will be able to perform functional activities with optimal posture and minimal cueing for posture    Baseline Pt sits with forward head posture and rounded shoulders. Can correct posture when cued.    Time 8    Period Weeks    Status New    Target Date 02/08/21      PT LONG TERM GOAL #2   Title Pt will report pain at 2/10 in thoracic region when performing reaching activies    Time 8    Period Weeks    Status New    Target Date 02/08/21      PT LONG TERM GOAL #3   Title Pt will report pain between 0-2/10 in right scapular region when performing functional activites and after taking a long walk    Time 8    Period Weeks    Status New    Target Date 02/08/21      PT LONG TERM GOAL #4   Title Pt will be able to perform final HEP independently    Time 8    Period Weeks    Status New    Target Date 02/08/21                   Plan - 01/10/21 1142     Clinical Impression Statement Pt was able to perform therapeutic exercises with min cueing for upright posture and relax upper traps. Pt continues to have min stiffness in thoracic vertebrae but R thoracic paraspinals and rhomboids have reduced tension than previous visit. Pt was able to perform functional  lifting activities without increased pain in shoulders but reports some pain in bil calves due to peripheral neuropathy. Calf pain was relieved with stretching. Pt progressing well overall with less pain. PT discussed potential d/c with pt at next visit and pt was in agreement with plan. Plan to review final HEP with pt at next visit.    Personal Factors and Comorbidities  Past/Current Experience;Time since onset of injury/illness/exacerbation    Examination-Activity Limitations Bend;Reach Overhead;Carry;Lift    Examination-Participation Restrictions Cleaning;Community Activity;Yard Work    Stability/Clinical Decision Making Stable/Uncomplicated    Rehab Potential Good    PT Frequency 1x / week    PT Duration 8 weeks    PT Treatment/Interventions ADLs/Self Care Home Management;Cryotherapy;Electrical Stimulation;Iontophoresis 86m/ml Dexamethasone;Moist Heat;Traction;Ultrasound;DME Instruction;Gait training;Neuromuscular re-education;Balance training;Therapeutic exercise;Therapeutic activities;Functional mobility training;Stair training;Patient/family education;Manual techniques;Passive range of motion;Dry needling;Taping;Joint Manipulations;Spinal Manipulations    PT Next Visit Plan review final HEP    PT Home Exercise Plan [id number]   Consulted and Agree with Plan of Care Patient             Patient will benefit from skilled therapeutic intervention in order to improve the following deficits and impairments:  Decreased range of motion,Increased muscle spasms,Decreased activity tolerance,Pain,Impaired flexibility,Improper body mechanics,Decreased mobility,Decreased strength  Visit Diagnosis: Pain in thoracic spine  Muscle spasm of back  Muscle weakness (generalized)     Problem List Patient Active Problem List   Diagnosis Date Noted   Peripheral neuropathy due to chemotherapy ([place] 12/11/2020   Elevated blood pressure reading 06/11/2020   Ventral hernia without obstruction or gangrene 06/11/2020   Leg edema 08/09/2019   Rash 08/09/2019   Dyslipidemia 04/06/2019   OSA (obstructive sleep apnea) 07/16/2018   Erectile dysfunction 07/16/2018   BPH 07/16/2018   Frequent headaches 07/16/2018   CAD s/p CABG 07/16/2018   Liver transplant recipient ([facility] 11/01/2015    DLeighton ParodySPT 01/10/2021  LLyndee Hensen PT, DPT 3:42 PM  01/10/21   This  entire session was performed under direct supervision and direction of a licensed therapist/therapist assistant . I have personally read, edited and approve of the note as written.    [facility] [place] [address]Phone: [phone]  Fax:  [phone] Name: KCarole DeereMRN: [id number] of Birth: 408-05-1951 PHYSICAL THERAPY DISCHARGE SUMMARY  Visits from Start of Care: 6  Plan: Patient agrees to discharge.  Patient goals were partially met. Patient is being discharged due to meeting the stated rehab goals.    Pt did not return for last visit. After chart review, it appears pt was having other medical issues at that time.    LLyndee Hensen PT, DPT 4:53 PM  03/07/21

## 2021-01-10 NOTE — Telephone Encounter (Signed)
Patient advise to schedule appointment with PCP

## 2021-01-11 ENCOUNTER — Encounter: Payer: Self-pay | Admitting: Family Medicine

## 2021-01-14 ENCOUNTER — Telehealth: Payer: Self-pay

## 2021-01-14 ENCOUNTER — Ambulatory Visit (HOSPITAL_COMMUNITY)
Admission: RE | Admit: 2021-01-14 | Discharge: 2021-01-14 | Disposition: A | Discharge status: Home / Self Care | Payer: Medicare Other | Source: Ambulatory Visit | Attending: Family Medicine | Admitting: Family Medicine

## 2021-01-14 ENCOUNTER — Other Ambulatory Visit: Payer: Self-pay

## 2021-01-14 ENCOUNTER — Ambulatory Visit (INDEPENDENT_AMBULATORY_CARE_PROVIDER_SITE_OTHER): Payer: Medicare Other | Admitting: Family Medicine

## 2021-01-14 ENCOUNTER — Telehealth: Payer: Self-pay | Admitting: *Deleted

## 2021-01-14 ENCOUNTER — Encounter: Payer: Medicare Other | Admitting: Physical Therapy

## 2021-01-14 ENCOUNTER — Encounter: Payer: Self-pay | Admitting: Family Medicine

## 2021-01-14 VITALS — BP 109/63 | HR 75 | Temp 97.3°F | Ht 72.0 in | Wt 235.0 lb

## 2021-01-14 DIAGNOSIS — G620 Drug-induced polyneuropathy: Secondary | ICD-10-CM | POA: Diagnosis not present

## 2021-01-14 DIAGNOSIS — T451X5A Adverse effect of antineoplastic and immunosuppressive drugs, initial encounter: Secondary | ICD-10-CM

## 2021-01-14 DIAGNOSIS — M79604 Pain in right leg: Secondary | ICD-10-CM | POA: Insufficient documentation

## 2021-01-14 DIAGNOSIS — Z940 Kidney transplant status: Secondary | ICD-10-CM | POA: Diagnosis not present

## 2021-01-14 DIAGNOSIS — D849 Immunodeficiency, unspecified: Secondary | ICD-10-CM | POA: Diagnosis not present

## 2021-01-14 DIAGNOSIS — Z944 Liver transplant status: Secondary | ICD-10-CM | POA: Diagnosis not present

## 2021-01-14 MED ORDER — RIVAROXABAN (XARELTO) VTE STARTER PACK (15 & 20 MG): ORAL_TABLET | ORAL | 0 refills | Status: DC

## 2021-01-14 NOTE — Telephone Encounter (Signed)
Patient is calling in wondering when he needs to follow up with Dr.Parker, didn't see any follow up in AVS.

## 2021-01-14 NOTE — Patient Instructions (Signed)
It was very nice to see you today!  I am concerned about a blood clot in your right leg.  We will check a stat ultrasound to further evaluate this.  Please start the blood thinner.  We will clean out your ears today.  You need to get a nerve conduction study and potentially switch her gabapentin to Lyrica but lets check with blood clot in your leg first.  Take care, Dr Jerline Pain  PLEASE NOTE:  If you had any lab tests please let us know if you have not heard back within a few days. You may see your results on mychart before we have a chance to review them but we will give you a call once they are reviewed by [name]. If we ordered any referrals today, please let us know if you have not heard from their office within the next week.   Please try these tips to maintain a healthy lifestyle:   Eat at least 3 REAL meals and 1-2 snacks per day.  Aim for no more than 5 hours between eating.  If you eat breakfast, please do so within one hour of getting up.    Each meal should contain half fruits/vegetables, one quarter protein, and one quarter carbs (no bigger than a computer mouse)   Cut down on sweet beverages. This includes juice, soda, and sweet tea.     Drink at least 1 glass of water with each meal and aim for at least 8 glasses per day   Exercise at least 150 minutes every week.

## 2021-01-14 NOTE — Assessment & Plan Note (Signed)
Continue gabapentin 300 mg nightly.  Will need nerve conduction study at some point in the near future.  Consider switching over to Lyrica to see if he does better with this than the gabapentin and we will address more pressing concerns above first.

## 2021-01-14 NOTE — Telephone Encounter (Signed)
Patient has appointment today

## 2021-01-14 NOTE — Telephone Encounter (Signed)
Brandon [name] form cardiovascular call with critical report  Patient positive for DVT On rt leg extende to Distal Femoral vein    Informed Dr Jerline Pain of results, Per Dr Jerline Pain ok for patient to be discharge from Cardiovascular, advise patient to start taking Rx Xarelto as discussed today at office visit

## 2021-01-14 NOTE — Progress Notes (Signed)
   Brandon [name] is a 70 y.o. male who presents today for an office visit.  Assessment/Plan:  New/Acute Problems: Right Leg Pain Elevated well scores.  Concern for DVT.  Will start empiric Xarelto 15mg  twice daily.  Will check stat ultrasound.  If confirmed will need full 5-month course of anticoagulation.  Cerumen impaction Successfully irrigated by RMA today.  He tolerated well.  Chronic Problems Addressed Today: Peripheral neuropathy due to chemotherapy ([facility]) Continue gabapentin 300 mg nightly.  Will need nerve conduction study at some point in the near future.  Consider switching over to Lyrica to see if he does better with this than the gabapentin and we will address more pressing concerns above first.     Subjective:  HPI:  Patient with pain and swelling into his right calf.  He was on a plane ride to [facility] last week.  Symptoms started shortly afterwards.  He has had difficulty walking due to the pain and swelling.  Is never anything like this before.  No treatments tried.  No injuries.  He is worried about blood clot.  We started him about a month ago on gabapentin for his peripheral neuropathy.  This is helped modestly.  He tried increasing the dose to 300 mg twice daily but did not tolerate due to somnolence.  Would also like to have his ears cleaned out today.       Objective:  Physical Exam: BP 109/63   Pulse 75   Temp (!) 97.3 F (36.3 C) (Temporal)   Ht 6' (1.829 m)   Wt 235 lb (106.6 kg)   SpO2 95%   BMI 31.87 kg/m   Wt Readings from Last 3 Encounters:  01/14/21 235 lb (106.6 kg)  12/11/20 237 lb 3.2 oz (107.6 kg)  09/26/20 250 lb (113.4 kg)    Gen: No acute distress, resting comfortably HEENT: Left TM clear.  Right EAC obscured by cerumen. CV: Regular rate and rhythm with no murmurs appreciated Pulm: Normal work of breathing, clear to auscultation bilaterally with no crackles, wheezes, or rhonchi MSK: Right calf grossly edematous.  Several  superficial veins noted.  Varicose veins also noted.  Bevelyn Buckles' sign positive.  Neurovascular intact distally. Neuro: Grossly normal, moves all extremities Psych: Normal affect and thought content  Time Spent: 45 minutes of total time was spent on the date of the encounter performing the following actions: chart review prior to seeing the patient including recent visit with neurology, obtaining history, performing a medically necessary exam, counseling on the treatment plan, placing orders, and documenting in our EHR.        Algis Greenhouse. Jerline Pain, MD 01/14/2021 2:05 PM

## 2021-01-15 ENCOUNTER — Encounter: Payer: Self-pay | Admitting: Family Medicine

## 2021-01-15 ENCOUNTER — Other Ambulatory Visit: Payer: Self-pay

## 2021-01-15 ENCOUNTER — Telehealth: Payer: Self-pay | Admitting: Family Medicine

## 2021-01-15 DIAGNOSIS — I25810 Atherosclerosis of coronary artery bypass graft(s) without angina pectoris: Secondary | ICD-10-CM

## 2021-01-15 DIAGNOSIS — I10 Essential (primary) hypertension: Secondary | ICD-10-CM

## 2021-01-15 MED ORDER — LOSARTAN POTASSIUM 25 MG PO TABS: 25.0000 mg | ORAL_TABLET | Freq: Every day | ORAL | 2 refills | Status: DC

## 2021-01-15 NOTE — Telephone Encounter (Signed)
Please see result note 

## 2021-01-15 NOTE — Chronic Care Management (AMB) (Signed)
  Chronic Care Management   Note  01/15/2021 Name: Brandon [name] MRN: [id number] DOB: 02-05-1951  Brandon [name] is a 70 y.o. year old male who is a primary care patient of [name], MD. I reached out to [name] by phone today in response to a referral sent by Brandon [name]'s PCP, [name], MD.   Brandon [name] was given information about Chronic Care Management services today including:  1. CCM service includes personalized support from designated clinical staff supervised by his physician, including individualized plan of care and coordination with other care providers 2. 24/7 contact phone numbers for assistance for urgent and routine care needs. 3. Service will only be billed when office clinical staff spend 20 minutes or more in a month to coordinate care. 4. Only one practitioner may furnish and bill the service in a calendar month. 5. The patient may stop CCM services at any time (effective at the end of the month) by phone call to the office staff.   Patient agreed to services and verbal consent obtained.   Follow up plan:   Lauretta Grill [employer] Scheduler

## 2021-01-15 NOTE — Progress Notes (Signed)
Please inform patient of the following:  As ultrasound confirms blood clot.  We discussed this with him yesterday.  He should stay on Xarelto 15 mg twice daily for 3 weeks and then transition to 20 mg once daily.  He should stay on xarelto for a total of 3 months.  His pain and swelling should gradually improve over the next several days.  I would like for him to let us know if not.  I would like to see him back in a couple of weeks for follow up.  Brandon [name]. Jerline Pain, MD 01/15/2021 9:06 AM

## 2021-01-15 NOTE — Telephone Encounter (Signed)
Patient notified, need to return in two weeks for F/U

## 2021-01-16 ENCOUNTER — Telehealth: Payer: Self-pay

## 2021-01-16 NOTE — Telephone Encounter (Signed)
FYI

## 2021-01-16 NOTE — Telephone Encounter (Signed)
See note

## 2021-01-16 NOTE — Telephone Encounter (Signed)
Pt called stating Dr. Jerline Pain prescribed him Xarelto 15 & 20 mg. Pt states he cannot afford this medication because it costs $500. Pt asked if there is anything else Dr. Jerline Pain can send in that may be less expensive. Please advise.

## 2021-01-16 NOTE — Telephone Encounter (Signed)
Patient is going sign up for patient assistance so patient doesn't want anything else sent in.

## 2021-01-17 DIAGNOSIS — Z944 Liver transplant status: Secondary | ICD-10-CM | POA: Diagnosis not present

## 2021-01-17 DIAGNOSIS — K8689 Other specified diseases of pancreas: Secondary | ICD-10-CM | POA: Diagnosis not present

## 2021-01-17 DIAGNOSIS — K769 Liver disease, unspecified: Secondary | ICD-10-CM | POA: Diagnosis not present

## 2021-01-17 DIAGNOSIS — Z87891 Personal history of nicotine dependence: Secondary | ICD-10-CM | POA: Diagnosis not present

## 2021-01-17 DIAGNOSIS — I440 Atrioventricular block, first degree: Secondary | ICD-10-CM | POA: Diagnosis not present

## 2021-01-17 DIAGNOSIS — Z86718 Personal history of other venous thrombosis and embolism: Secondary | ICD-10-CM | POA: Diagnosis not present

## 2021-01-17 DIAGNOSIS — M7918 Myalgia, other site: Secondary | ICD-10-CM | POA: Diagnosis not present

## 2021-01-17 DIAGNOSIS — I2699 Other pulmonary embolism without acute cor pulmonale: Secondary | ICD-10-CM | POA: Diagnosis not present

## 2021-01-17 DIAGNOSIS — Z20822 Contact with and (suspected) exposure to covid-19: Secondary | ICD-10-CM | POA: Diagnosis not present

## 2021-01-17 DIAGNOSIS — R918 Other nonspecific abnormal finding of lung field: Secondary | ICD-10-CM | POA: Diagnosis not present

## 2021-01-17 DIAGNOSIS — Z7901 Long term (current) use of anticoagulants: Secondary | ICD-10-CM | POA: Diagnosis not present

## 2021-01-17 DIAGNOSIS — I2510 Atherosclerotic heart disease of native coronary artery without angina pectoris: Secondary | ICD-10-CM | POA: Diagnosis not present

## 2021-01-17 DIAGNOSIS — I498 Other specified cardiac arrhythmias: Secondary | ICD-10-CM | POA: Diagnosis not present

## 2021-01-17 DIAGNOSIS — Z8505 Personal history of malignant neoplasm of liver: Secondary | ICD-10-CM | POA: Diagnosis not present

## 2021-01-17 DIAGNOSIS — C220 Liver cell carcinoma: Secondary | ICD-10-CM | POA: Diagnosis not present

## 2021-01-17 DIAGNOSIS — I82401 Acute embolism and thrombosis of unspecified deep veins of right lower extremity: Secondary | ICD-10-CM | POA: Diagnosis not present

## 2021-01-17 DIAGNOSIS — K869 Disease of pancreas, unspecified: Secondary | ICD-10-CM | POA: Diagnosis not present

## 2021-01-17 DIAGNOSIS — R7401 Elevation of levels of liver transaminase levels: Secondary | ICD-10-CM | POA: Diagnosis not present

## 2021-01-17 DIAGNOSIS — R160 Hepatomegaly, not elsewhere classified: Secondary | ICD-10-CM | POA: Diagnosis not present

## 2021-01-17 DIAGNOSIS — J90 Pleural effusion, not elsewhere classified: Secondary | ICD-10-CM | POA: Diagnosis not present

## 2021-01-17 DIAGNOSIS — R600 Localized edema: Secondary | ICD-10-CM | POA: Diagnosis not present

## 2021-01-17 DIAGNOSIS — D735 Infarction of spleen: Secondary | ICD-10-CM | POA: Diagnosis not present

## 2021-01-17 DIAGNOSIS — G4733 Obstructive sleep apnea (adult) (pediatric): Secondary | ICD-10-CM | POA: Diagnosis not present

## 2021-01-17 DIAGNOSIS — R162 Hepatomegaly with splenomegaly, not elsewhere classified: Secondary | ICD-10-CM | POA: Diagnosis not present

## 2021-01-17 DIAGNOSIS — D849 Immunodeficiency, unspecified: Secondary | ICD-10-CM | POA: Diagnosis not present

## 2021-01-17 DIAGNOSIS — R7989 Other specified abnormal findings of blood chemistry: Secondary | ICD-10-CM | POA: Diagnosis not present

## 2021-01-17 DIAGNOSIS — R591 Generalized enlarged lymph nodes: Secondary | ICD-10-CM | POA: Diagnosis not present

## 2021-01-17 DIAGNOSIS — R978 Other abnormal tumor markers: Secondary | ICD-10-CM | POA: Diagnosis not present

## 2021-01-17 DIAGNOSIS — K7030 Alcoholic cirrhosis of liver without ascites: Secondary | ICD-10-CM | POA: Diagnosis not present

## 2021-01-17 DIAGNOSIS — I10 Essential (primary) hypertension: Secondary | ICD-10-CM | POA: Diagnosis not present

## 2021-01-17 MED ORDER — TRAMADOL HCL 50 MG PO TABS: 50.0000 mg | ORAL_TABLET | Freq: Three times a day (TID) | ORAL | 0 refills | Status: AC | PRN

## 2021-01-17 NOTE — Telephone Encounter (Signed)
Patient requesting Rx for pain  Transfer to front office to schedule F/U appointment in 2-3 weeks

## 2021-01-18 ENCOUNTER — Encounter: Payer: Self-pay | Admitting: Family Medicine

## 2021-01-18 DIAGNOSIS — D849 Immunodeficiency, unspecified: Secondary | ICD-10-CM | POA: Diagnosis not present

## 2021-01-18 DIAGNOSIS — Z86718 Personal history of other venous thrombosis and embolism: Secondary | ICD-10-CM | POA: Diagnosis not present

## 2021-01-18 DIAGNOSIS — Z7901 Long term (current) use of anticoagulants: Secondary | ICD-10-CM | POA: Diagnosis not present

## 2021-01-18 DIAGNOSIS — R160 Hepatomegaly, not elsewhere classified: Secondary | ICD-10-CM | POA: Diagnosis not present

## 2021-01-18 DIAGNOSIS — K8689 Other specified diseases of pancreas: Secondary | ICD-10-CM | POA: Diagnosis not present

## 2021-01-18 DIAGNOSIS — I2699 Other pulmonary embolism without acute cor pulmonale: Secondary | ICD-10-CM | POA: Diagnosis not present

## 2021-01-18 DIAGNOSIS — Z944 Liver transplant status: Secondary | ICD-10-CM | POA: Diagnosis not present

## 2021-01-18 NOTE — Telephone Encounter (Signed)
See note

## 2021-01-21 ENCOUNTER — Other Ambulatory Visit: Payer: Self-pay

## 2021-01-21 MED ORDER — RIVAROXABAN 15 MG PO TABS: 15.0000 mg | ORAL_TABLET | Freq: Two times a day (BID) | ORAL | 0 refills | Status: DC

## 2021-01-21 MED ORDER — RIVAROXABAN 20 MG PO TABS: 20.0000 mg | ORAL_TABLET | Freq: Every day | ORAL | 1 refills | Status: DC

## 2021-01-21 NOTE — Telephone Encounter (Signed)
Patient stated the cost of xarelto is expensive is there a alternative,wants to know your thoughts on changing Rx. Please advise  Sample of Brandon [name]  ready for pick up

## 2021-01-21 NOTE — Telephone Encounter (Signed)
Patient will call back to see if Rx is covered

## 2021-01-21 NOTE — Telephone Encounter (Signed)
Patient will take xarelto 15 mg twice a day for 2 weeks and then 20 mg daily

## 2021-01-21 NOTE — Telephone Encounter (Signed)
We can try eliquis but he may want to check with his insurance to see if anything is covered.  Brandon [name]. Jerline Pain, MD 01/21/2021 10:07 AM

## 2021-01-21 NOTE — Telephone Encounter (Signed)
Patient states he needs dosage in order to find out price.  Please follow up in regard with patient at [phone].

## 2021-01-21 NOTE — Telephone Encounter (Signed)
Pt called following up on this. Please advise. Pt states prescription needs to be changed. Pt has also ran out of his temporary rx.

## 2021-01-22 ENCOUNTER — Encounter: Payer: Self-pay | Admitting: Family Medicine

## 2021-01-22 NOTE — Telephone Encounter (Signed)
Pt called asking to speak with Dr. Jerline Pain. Pt states its very important and that he needs to speak with Dr. Jerline Pain today about his biopsy this week. Please advise.

## 2021-01-22 NOTE — Telephone Encounter (Signed)
See note

## 2021-01-23 ENCOUNTER — Telehealth: Payer: Self-pay

## 2021-01-23 ENCOUNTER — Other Ambulatory Visit: Payer: Self-pay | Admitting: *Deleted

## 2021-01-23 MED ORDER — ENOXAPARIN SODIUM 40 MG/0.4ML IJ SOSY: 100.0000 mg | PREFILLED_SYRINGE | Freq: Two times a day (BID) | INTRAMUSCULAR | 0 refills | Status: DC

## 2021-01-23 MED ORDER — "SYRINGE 25G X 5/8"" 3 ML MISC": 0 refills | Status: AC

## 2021-01-23 NOTE — Telephone Encounter (Signed)
Dr Dorris Fetch wants [name] to start a Lovenox bridge.  This is an alternative blood thinner than the Xarelto.  I will send a Lovenox prescription into his pharmacy.  This will need to be administered subcutaneously via syringe and needle.  Recommend he come in for nurse visit for training he is not comfortable doing this.  He should stop the Xarelto 2 days before the surgery and take the Lovenox in place of Xarelto.  His last dose of Lovenox should be about 24 hours prior to his procedure.  He can then resume Lovenox the evening or afternoon after his surgery.  He should stay on the Lovenox for 2 days then switch back to his previous dose of Xarelto.  He should not take both Xarelto and Lovenox at the same time on any day.  Algis Greenhouse. Jerline Pain, MD 01/23/2021 12:05 PM

## 2021-01-23 NOTE — Telephone Encounter (Signed)
Send Dr Jerline Pain note to patient

## 2021-01-23 NOTE — Telephone Encounter (Signed)
See note

## 2021-01-23 NOTE — Telephone Encounter (Signed)
[facility] is calling in for clarification on the medication enoxaparin (LOVENOX) 40 MG/0.4ML injection, it states to inject the 40 MG of 100 MG every 12 hours into the skin. Wondering if it should have been 100 mg and then inject 40 MG into skin so patient isnt having to use so many syringes.

## 2021-01-24 ENCOUNTER — Telehealth: Payer: Self-pay

## 2021-01-24 ENCOUNTER — Other Ambulatory Visit: Payer: Self-pay

## 2021-01-24 ENCOUNTER — Ambulatory Visit: Payer: Medicare Other

## 2021-01-24 ENCOUNTER — Other Ambulatory Visit: Payer: Self-pay | Admitting: Family Medicine

## 2021-01-24 VITALS — BP 140/78 | HR 89 | Temp 97.9°F | Ht 72.0 in | Wt 233.0 lb

## 2021-01-24 DIAGNOSIS — Z719 Counseling, unspecified: Secondary | ICD-10-CM

## 2021-01-24 MED ORDER — ENOXAPARIN SODIUM 300 MG/3ML IJ SOLN: 0.9500 mg/kg | Freq: Every day | INTRAMUSCULAR | 1 refills | Status: DC

## 2021-01-24 NOTE — Telephone Encounter (Signed)
[facility] form was faxed

## 2021-01-24 NOTE — Progress Notes (Signed)
lovenox                 

## 2021-01-24 NOTE — Telephone Encounter (Signed)
Please verified Rx  

## 2021-01-24 NOTE — Telephone Encounter (Signed)
He needs to be on 100mg /kg every 12 hours for therapeutic treatment of his DVT. 40mg  daily is the prophylactic dosing.  He will only be on this for a couple of days.  Algis Greenhouse. Jerline Pain, MD 01/24/2021 2:07 PM

## 2021-01-24 NOTE — Progress Notes (Signed)
Pt was instructed on how to self administer Lovenox injections. Pt was given education material. Pt voiced understanding, but still has questions about his upcoming surgery and when to discontinue blood thinner. Pt was advised to follow up with surgeon.

## 2021-01-24 NOTE — Telephone Encounter (Signed)
Brandon [name] from [place] called asking about the prescription for enoxaprin 100mg . [name] states the received the prescription yesterday and was directed to send it to a different pharmacy. They received the prescription again today so they are wanting to confirm if they need to refill it or not. Please advise.

## 2021-01-25 ENCOUNTER — Telehealth: Payer: Self-pay

## 2021-01-25 ENCOUNTER — Other Ambulatory Visit: Payer: Self-pay | Admitting: *Deleted

## 2021-01-25 MED ORDER — ENOXAPARIN SODIUM 300 MG/3ML IJ SOLN
0.9500 mg/kg | Freq: Every day | INTRAMUSCULAR | 1 refills | Status: DC
Start: 2021-01-25 — End: 2021-02-07

## 2021-01-25 MED ORDER — RIVAROXABAN 20 MG PO TABS: 20.0000 mg | ORAL_TABLET | Freq: Every day | ORAL | 1 refills | Status: DC

## 2021-01-25 NOTE — Telephone Encounter (Signed)
Rx was transfer  to [facility] per patient

## 2021-01-25 NOTE — Telephone Encounter (Signed)
Spoke with Tenna Delaine, gave her information about  Rx Lovenox was send to [facility]

## 2021-01-25 NOTE — Telephone Encounter (Signed)
Pt called requesting to speak with Brandon [name]. Pt would like for her to give him a call regarding his medication. Pt also wanted to let Brandon [name] know he wants her to call Tenna Delaine at [facility] who is the coordinator for Dr. Dorris Fetch. Her number is [phone]. Please advise.

## 2021-01-25 NOTE — Telephone Encounter (Signed)
Rx send to pharmacy [facility]

## 2021-01-30 ENCOUNTER — Other Ambulatory Visit: Payer: Self-pay

## 2021-01-30 ENCOUNTER — Ambulatory Visit (INDEPENDENT_AMBULATORY_CARE_PROVIDER_SITE_OTHER): Payer: Medicare Other | Admitting: Family Medicine

## 2021-01-30 ENCOUNTER — Encounter: Payer: Self-pay | Admitting: Family Medicine

## 2021-01-30 ENCOUNTER — Other Ambulatory Visit: Payer: Self-pay | Admitting: Endocrinology

## 2021-01-30 DIAGNOSIS — I824Y9 Acute embolism and thrombosis of unspecified deep veins of unspecified proximal lower extremity: Secondary | ICD-10-CM

## 2021-01-30 DIAGNOSIS — I82409 Acute embolism and thrombosis of unspecified deep veins of unspecified lower extremity: Secondary | ICD-10-CM | POA: Insufficient documentation

## 2021-01-30 DIAGNOSIS — K8689 Other specified diseases of pancreas: Secondary | ICD-10-CM | POA: Diagnosis not present

## 2021-01-30 MED ORDER — TRAMADOL HCL 50 MG PO TABS: 50.0000 mg | ORAL_TABLET | Freq: Three times a day (TID) | ORAL | 0 refills | Status: AC | PRN

## 2021-01-30 NOTE — Progress Notes (Signed)
   Brandon [name] is a 70 y.o. male who presents today for an office visit.  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  He will be undergoing biopsy tomorrow.  Is currently on Lovenox bridge per request of his specialist at [facility].  Main symptom at this point is pain.  I refilled his tramadol today.  This is worked reasonably well for him.  He will check in with me in a few weeks to let me know how things are going.  Also discussed stress of uncertainty regarding his newfound mass.  He has good support structure in the community.  Does not want to start medications at this point though he will check with me in a couple weeks.  DVT (deep venous thrombosis) ([place]) He is anticoagulated on Xarelto though is currently undergoing Lovenox bridge.  We will continue for at least 3 months depending on results of his biopsy report.  Will likely need long-term or indefinite anticoagulation depending on work-up for his pancreatic mass.     Subjective:  HPI:  Patient here for follow-up.  He was seen a couple weeks ago.  Diagnosed with DVT.  Since his last visit he was found to have pulmonary embolism.  Subsequent work-up additionally showed concerning lesions in his liver, pancreas and spleen concerning for malignant process.  He is scheduled for biopsy tomorrow at [facility].  This is caused quite a bit of stress.  Feels like he is managing it reasonably well.  Main symptom today is pain.  Has had persistent back and abdominal pain for the last few weeks.  This is made it difficult for him to sleep.  We prescribed him tramadol a couple weeks ago which is worked well though he has been rationing this out.       Objective:  Physical Exam: BP 103/68   Pulse 97   Temp 97.8 F (36.6 C) (Temporal)   Ht 6' (1.829 m)   Wt 231 lb (104.8 kg)   SpO2 97%   BMI 31.33 kg/m   Gen: No acute distress, resting  comfortably Neuro: Grossly normal, moves all extremities Psych: Normal affect and thought content  Time Spent: 45 minutes of total time was spent on the date of the encounter performing the following actions: chart review prior to seeing the patient including recent imaging and visits with specialists, obtaining history, performing a medically necessary exam, counseling on the treatment plan, placing orders, and documenting in our EHR.        Algis Greenhouse. Jerline Pain, MD 01/30/2021 11:23 AM

## 2021-01-30 NOTE — Patient Instructions (Signed)
It was very nice to see you today!  I will refill your tramadol.  Please take 2-3 times daily as needed.  Would like to see back in a few months but but see how things go with your biopsy and visits with [facility] first.  Please let me know how things are going with your pain and stress levels if you need any extra additional assistance.  Take care, Dr Jerline Pain  PLEASE NOTE:  If you had any lab tests please let us know if you have not heard back within a few days. You may see your results on mychart before we have a chance to review them but we will give you a call once they are reviewed by [name]. If we ordered any referrals today, please let us know if you have not heard from their office within the next week.   Please try these tips to maintain a healthy lifestyle:   Eat at least 3 REAL meals and 1-2 snacks per day.  Aim for no more than 5 hours between eating.  If you eat breakfast, please do so within one hour of getting up.    Each meal should contain half fruits/vegetables, one quarter protein, and one quarter carbs (no bigger than a computer mouse)   Cut down on sweet beverages. This includes juice, soda, and sweet tea.     Drink at least 1 glass of water with each meal and aim for at least 8 glasses per day   Exercise at least 150 minutes every week.

## 2021-01-30 NOTE — Assessment & Plan Note (Signed)
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  He will be undergoing biopsy tomorrow.  Is currently on Lovenox bridge per request of his specialist at [facility].  Main symptom at this point is pain.  I refilled his tramadol today.  This is worked reasonably well for him.  He will check in with me in a few weeks to let me know how things are going.  Also discussed stress of uncertainty regarding his newfound mass.  He has good support structure in the community.  Does not want to start medications at this point though he will check with me in a couple weeks.

## 2021-01-30 NOTE — Assessment & Plan Note (Signed)
He is anticoagulated on Xarelto though is currently undergoing Lovenox bridge.  We will continue for at least 3 months depending on results of his biopsy report.  Will likely need long-term or indefinite anticoagulation depending on work-up for his pancreatic mass.

## 2021-01-31 DIAGNOSIS — C259 Malignant neoplasm of pancreas, unspecified: Secondary | ICD-10-CM | POA: Diagnosis not present

## 2021-01-31 DIAGNOSIS — Z79899 Other long term (current) drug therapy: Secondary | ICD-10-CM | POA: Diagnosis not present

## 2021-01-31 DIAGNOSIS — C787 Secondary malignant neoplasm of liver and intrahepatic bile duct: Secondary | ICD-10-CM | POA: Diagnosis not present

## 2021-01-31 DIAGNOSIS — Z944 Liver transplant status: Secondary | ICD-10-CM | POA: Diagnosis not present

## 2021-01-31 DIAGNOSIS — C220 Liver cell carcinoma: Secondary | ICD-10-CM | POA: Diagnosis not present

## 2021-01-31 DIAGNOSIS — Z01812 Encounter for preprocedural laboratory examination: Secondary | ICD-10-CM | POA: Diagnosis not present

## 2021-01-31 DIAGNOSIS — D849 Immunodeficiency, unspecified: Secondary | ICD-10-CM | POA: Diagnosis not present

## 2021-02-06 ENCOUNTER — Other Ambulatory Visit: Payer: Self-pay | Admitting: Family Medicine

## 2021-02-06 DIAGNOSIS — C787 Secondary malignant neoplasm of liver and intrahepatic bile duct: Secondary | ICD-10-CM | POA: Diagnosis not present

## 2021-02-06 DIAGNOSIS — C259 Malignant neoplasm of pancreas, unspecified: Secondary | ICD-10-CM | POA: Diagnosis not present

## 2021-02-07 ENCOUNTER — Other Ambulatory Visit: Payer: Self-pay

## 2021-02-07 ENCOUNTER — Inpatient Hospital Stay: Payer: Medicare Other | Attending: Hematology | Admitting: Hematology

## 2021-02-07 ENCOUNTER — Encounter: Payer: Self-pay | Admitting: Hematology

## 2021-02-07 ENCOUNTER — Ambulatory Visit
Admission: RE | Admit: 2021-02-07 | Discharge: 2021-02-07 | Disposition: A | Discharge status: Home / Self Care | Payer: Self-pay | Source: Ambulatory Visit | Attending: Hematology | Admitting: Hematology

## 2021-02-07 VITALS — BP 151/93 | HR 93 | Temp 97.6°F | Resp 18 | Ht 72.0 in | Wt 234.9 lb

## 2021-02-07 DIAGNOSIS — C252 Malignant neoplasm of tail of pancreas: Secondary | ICD-10-CM | POA: Insufficient documentation

## 2021-02-07 DIAGNOSIS — Z8042 Family history of malignant neoplasm of prostate: Secondary | ICD-10-CM | POA: Insufficient documentation

## 2021-02-07 DIAGNOSIS — Z79899 Other long term (current) drug therapy: Secondary | ICD-10-CM | POA: Insufficient documentation

## 2021-02-07 DIAGNOSIS — I10 Essential (primary) hypertension: Secondary | ICD-10-CM | POA: Diagnosis not present

## 2021-02-07 DIAGNOSIS — Z87891 Personal history of nicotine dependence: Secondary | ICD-10-CM | POA: Insufficient documentation

## 2021-02-07 DIAGNOSIS — C259 Malignant neoplasm of pancreas, unspecified: Secondary | ICD-10-CM

## 2021-02-07 DIAGNOSIS — Z8051 Family history of malignant neoplasm of kidney: Secondary | ICD-10-CM | POA: Diagnosis not present

## 2021-02-07 DIAGNOSIS — Z944 Liver transplant status: Secondary | ICD-10-CM | POA: Diagnosis not present

## 2021-02-07 DIAGNOSIS — E785 Hyperlipidemia, unspecified: Secondary | ICD-10-CM | POA: Diagnosis not present

## 2021-02-07 DIAGNOSIS — N400 Enlarged prostate without lower urinary tract symptoms: Secondary | ICD-10-CM | POA: Diagnosis not present

## 2021-02-07 DIAGNOSIS — C787 Secondary malignant neoplasm of liver and intrahepatic bile duct: Secondary | ICD-10-CM

## 2021-02-07 DIAGNOSIS — Z86718 Personal history of other venous thrombosis and embolism: Secondary | ICD-10-CM | POA: Diagnosis not present

## 2021-02-07 DIAGNOSIS — Z7901 Long term (current) use of anticoagulants: Secondary | ICD-10-CM | POA: Insufficient documentation

## 2021-02-07 DIAGNOSIS — Z8249 Family history of ischemic heart disease and other diseases of the circulatory system: Secondary | ICD-10-CM | POA: Diagnosis not present

## 2021-02-07 DIAGNOSIS — Z800 Family history of malignant neoplasm of digestive organs: Secondary | ICD-10-CM | POA: Diagnosis not present

## 2021-02-07 NOTE — Progress Notes (Signed)
Spoke with patient regarding referral we received from Dr. Jonne Ply at [facility].  He has agreed to come into see Dr. Truitt Merle today at 3:30 to arrive by 3:15.  He is aware of our location.

## 2021-02-07 NOTE — Progress Notes (Signed)
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 Oralia Rud  Oncology History  Pancreatic cancer metastasized to liver [facility])  01/17/2021 Imaging   Liver US  1.  Limited evaluation of the liver parenchyma. Multiple hepatic masses are  visualized. Additionally, the IVC demonstrates internal echoes concerning  for thrombus/mass. Recommend Marshall protocol CT.  2.  Blood flow in the normal direction in the major hepatic blood vessels    01/17/2021 Imaging   CT A/P  Impression:  1.  Filling defect of a right lower lobe pulmonary vessel, incompletely visualized but likely representing pulmonary embolism. Adjacent small right pleural effusion. Recommend dedicated CT chest PE for further evaluation.  2.  Innumerable new hepatic masses concerning for metastatic disease. Biopsy recommended.  3.  Pancreatic tail lesion, which may represent primary pancreatic primary neoplasm versus metastasis.  4.  Paraesophageal lymphadenopathy concerning for nodal metastasis.    01/31/2021 Pathology Results   Liver Lesion FNA  Adenocarcinoma, see comment.   Comment: In the context of the patient's pancreatic mass, the morphology and immunophenotype are most consistent with a pancreaticobiliary primary.  The following immunohistochemistry was performed after review of the clinical history and morphology to further  characterize the pathologic process. The results are as follows:   A1-14    CK7                   Positive  A1-15    CK20                 Positive/weak     A1-16    CK19                 Positive  A1-17    CDX-2                Negative  A1-18    HEP PAR1         Negative    02/07/2021 Initial Diagnosis   Pancreatic cancer metastasized to liver [facility])      HISTORY OF PRESENTING ILLNESS:  Brandon [name] is a 70 y.o. male here because of metastatic pancreatic cancer. The patient was referred by Dr. Oralia Rud at [facility]. The patient presents to the clinic today accompanied by his wife, Brandon [name].   He is s/p liver transplant in 09/2015 at [facility] for alcoholic cirrhosis and hepatocellular carcinoma. He has been followed by [facility] since 09/2018 and managed with a combination of Zortress and mycophenolate. On routine lab work performed 01/14/21, he was found to have elevated liver enzymes. This prompted liver ultrasound on 01/17/21, showing multiple hepatic masses. He proceeded to CT A/P for further evaluation showing: innumerable new hepatic masses concerning for metastatic disease; pancreatic tail lesion, which may represent primary pancreatic neoplasm; paraesophageal lymphadenopathy concerning for nodal metastasis. He then underwent fine needle aspiration of the liver lesion on 01/31/21. Cytology from the procedure confirmed adenocarcinoma, with morphology and immunophenotype most consistent with a pancreaticobiliary primary.  Of note, filling defect of  a right lower lobe pulmonary vessel was noted on CT A/P. This was further evaluated with chest CT the same day, confirming acute pulmonary embolism.   Today the patient notes they feel tired. He reports worsening bilateral feet swelling and pain. He is taking Xarelto. He notes he has lost his appetite, his urine is dark, and his bowel movements are variable. He was previously walking and going to the gym. He is unable to do this anymore, walking will tire him.   He has  a PMHx of CAD s/p coronary artery bypass x4 and blood clot. He initially underwent workup for a painful blood clot in his right calf. DVT was confirmed on venous ultrasound on 01/14/21. He underwent routine blood work the same day, and the elevated enzymes were found.  Socially, he lives at home with his wife. 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 He has a daughter, who recently graduated from [employer] in 08/2020 with chemistry and art degrees. He reports metastatic melanoma in his father, prostate cancer in a younger brother at age 24, renal cancer in another younger brother at age 68, and liver cancer in a sister.   All other systems were reviewed with the patient and are negative.   MEDICAL HISTORY:  Past Medical History:  Diagnosis Date  . BPH (benign prostatic hyperplasia)   . Cancer [facility])    Hepatic  . Coronary artery disease   . Hyperlipidemia   . Hypertension   . Liver failure ([place])    Cirrhosis- alcoholic- Liver transplant 2017  . Pneumonia    as a kid    SURGICAL HISTORY: Past Surgical History:  Procedure Laterality Date  . COLONOSCOPY W/ POLYPECTOMY    . CORONARY ARTERY BYPASS GRAFT  07/2009  . LIVER TRANSPLANT  2017  . ORIF CLAVICULAR FRACTURE Left 04/29/2019   Procedure: OPEN REDUCTION INTERNAL FIXATION (ORIF) CLAVICULAR FRACTURE;  Surgeon: Altamese Castle Shannon, MD;  Location: [place];  Service: Orthopedics;  Laterality: Left;    SOCIAL HISTORY: Social History   Socioeconomic History  . Marital status: Married    Spouse name: Not on file  . Number of children: 1  . Years of education: Not on file  . Highest education level: Not on file  Occupational History  . Occupation: Retired   Tobacco Use  . Smoking status: Former Smoker    Packs/day: 1.00    Years: 25.00    Pack years: 25.00    Types: Cigarettes    Quit date: 01/12/1998    Years since quitting: 23.0  . Smokeless tobacco: Never Used  Vaping Use  . Vaping Use: Never used  Substance and Sexual  Activity  . 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 Drug use: Never  . Sexual activity: Yes  Other Topics Concern  . Not on file  Social History Narrative   Right Handed   Lives in a one story home   Drinks caffeine    Social Determinants of Health   Financial Resource Strain: Not on file  Food Insecurity: Not on file  Transportation Needs: Not on file  Physical Activity: Not on file  Stress: Not on file  Social Connections: Not on file  Intimate Partner Violence: Not on file    FAMILY HISTORY: Family History  Problem Relation Age of Onset  . Heart disease Sister   . Heart disease Brother   . Cancer Brother 50  kidney cancer   . Hyperlipidemia Mother   . Hyperlipidemia Father   . Cancer Father        melanoma   . Heart disease Sister   . Liver disease Sister   . Cancer Sister        liver cancer  . Prostate cancer Brother   . Cancer Brother 60       prosteate cancer  . Colon cancer Neg Hx   . Pancreatic cancer Neg Hx   . Stomach cancer Neg Hx   . Esophageal cancer Neg Hx     ALLERGIES:  is allergic to nsaids.  MEDICATIONS:  Current Outpatient Medications  Medication Sig Dispense Refill  . calcium gluconate 500 MG tablet Take 1 tablet by mouth 2 (two) times daily.     . Everolimus 0.75 MG TABS Take by mouth.    . Fexofenadine HCl (ALLERGY 24-HR PO) Take 1 tablet by mouth daily.    . furosemide (LASIX) 40 MG tablet TAKE ONE TABLET BY MOUTH DAILY 90 tablet 1  . gabapentin (NEURONTIN) 300 MG capsule Take 1 capsule (300 mg total) by mouth at bedtime. 90 capsule 3  . losartan (COZAAR) 25 MG tablet Take 1 tablet (25 mg total) by mouth daily. 90 tablet 2  . Multiple Vitamin (MULTIVITAMIN WITH MINERALS) TABS tablet Take 1 tablet by mouth daily.    . Mycophenolate Sodium (MYCOPHENOLIC ACID PO) Take 790 mg by mouth 2 (two) times daily.    . rivaroxaban (XARELTO) 20 MG TABS tablet Take 1 tablet (20 mg total) by  mouth daily with supper. (Patient not taking: Reported on 01/30/2021) 90 tablet 1  . rosuvastatin (CRESTOR) 5 MG tablet TAKE ONE TABLET BY MOUTH DAILY 60 tablet 0  . Syringe/Needle, Disp, (SYRINGE 3CC/25GX5/8") 25G X 5/8" 3 ML MISC Use to inject lovenox 50 each 0  . tadalafil (CIALIS) 20 MG tablet TAKE ONE TABLET BY MOUTH DAILY 30 tablet 5  . tamsulosin (FLOMAX) 0.4 MG CAPS capsule TAKE ONE CAPSULE BY MOUTH DAILY 30 capsule 5  . traMADol (ULTRAM) 50 MG tablet Take 1 tablet (50 mg total) by mouth every 8 (eight) hours as needed for up to 90 doses. 90 tablet 0   No current facility-administered medications for this visit.    PHYSICAL EXAMINATION: ECOG PERFORMANCE STATUS: 3 - Symptomatic, >50% confined to bed  Vitals:   02/07/21 1545  BP: (!) 151/93  Pulse: 93  Resp: 18  Temp: 97.6 F (36.4 C)  SpO2: 98%   Filed Weights   02/07/21 1545  Weight: 234 lb 14.4 oz (106.5 kg)    GENERAL:alert, no distress and comfortable SKIN: skin color, texture, turgor are normal, no rashes or significant lesions except jaundice  EYES: normal, Conjunctiva are pink and non-injected, sclera yellow OROPHARYNX:no exudate, no erythema and lips, buccal mucosa, and tongue normal NECK: supple, thyroid normal size, non-tender, without nodularity LYMPH:  no palpable lymphadenopathy in the cervical, axillary  LUNGS: clear to auscultation and percussion with normal breathing effort HEART: regular rate & rhythm and no murmurs and no lower extremity edema ABDOMEN:abdomen soft, non-tender and normal bowel sounds Musculoskeletal:no cyanosis of digits and no clubbing  NEURO: alert & oriented x 3 with fluent speech, no focal motor/sensory deficits  LABORATORY DATA:  I have reviewed the data as listed CBC Latest Ref Rng & Units 08/09/2019 04/29/2019 02/17/2019  WBC 4.0 - 10.5 K/uL 6.5 7.2 5.4  Hemoglobin 13.0 - 17.0 g/dL 15.5 14.8 14.6  Hematocrit 39.0 - 52.0 %  45.6 45.1 42.8  Platelets 150.0 - 400.0 K/uL 207.0 212  201.0    CMP Latest Ref Rng & Units 08/09/2019 04/29/2019 02/17/2019  Glucose 70 - 99 mg/dL 95 97 100(H)  BUN 6 - 23 mg/dL 18 18 16   Creatinine 0.40 - 1.50 mg/dL 1.20 1.11 1.13  Sodium 135 - 145 mEq/L 138 138 139  Potassium 3.5 - 5.1 mEq/L 4.2 3.9 4.5  Chloride 96 - 112 mEq/L 102 104 105  CO2 19 - 32 mEq/L 27 23 27   Calcium 8.4 - 10.5 mg/dL 9.9 9.3 9.3  Total Protein 6.0 - 8.3 g/dL 6.7 6.8 6.4  Total Bilirubin 0.2 - 1.2 mg/dL 0.8 1.2 0.6  Alkaline Phos 39 - 117 U/L 96 84 79  AST 0 - 37 U/L 23 36 21  ALT 0 - 53 U/L 25 45(H) 25     RADIOGRAPHIC STUDIES: I have personally reviewed the radiological images as listed and agreed with the findings in the report. VAS [name] LOWER EXTREMITY VENOUS (DVT)  Result Date: 01/14/2021  Lower Venous DVT Study Patient Name:  Brandon [name]  Date of Exam:   01/14/2021 Medical Rec #: [id number]       Accession #:    [id number] Date of Birth: 03-05-1951       Patient Gender: M Patient Age:   70Y Exam Location:  [facility] Procedure:      VAS [name] LOWER EXTREMITY VENOUS (DVT) Referring Phys: [id number] CALEB M PARKER --------------------------------------------------------------------------------  Indications: Pain, Swelling, and Edema.  Risk Factors: Surgery History of CABG. Performing Technologist: Delorise Shiner RVT  Examination Guidelines: A complete evaluation includes B-mode imaging, spectral Doppler, color Doppler, and power Doppler as needed of all accessible portions of each vessel. Bilateral testing is considered an integral part of a complete examination. Limited examinations for reoccurring indications may be performed as noted. The reflux portion of the exam is performed with the patient in reverse Trendelenburg.  +---------+---------------+---------+-----------+-----------------+------------+ RIGHT    CompressibilityPhasicitySpontaneityProperties       Thrombus                                                                  Aging         +---------+---------------+---------+-----------+-----------------+------------+ CFV      Full           Yes      Yes                                      +---------+---------------+---------+-----------+-----------------+------------+ SFJ      Full           Yes      Yes                                      +---------+---------------+---------+-----------+-----------------+------------+ FV Prox  Full           Yes      Yes                                      +---------+---------------+---------+-----------+-----------------+------------+  FV Mid   Full           Yes      Yes                                      +---------+---------------+---------+-----------+-----------------+------------+ FV DistalPartial        Yes      No                                       +---------+---------------+---------+-----------+-----------------+------------+ PFV      Full           Yes                                               +---------+---------------+---------+-----------+-----------------+------------+ POP      None           No       No         dilated          Acute        +---------+---------------+---------+-----------+-----------------+------------+ PTV      Full                                                             +---------+---------------+---------+-----------+-----------------+------------+ Soleal   None           No       No         dilated          Acute        +---------+---------------+---------+-----------+-----------------+------------+ Gastroc  None           No       No         dilated          Acute        +---------+---------------+---------+-----------+-----------------+------------+ GSV      Partial                 No         spongy           Acute                                                    w/compression                 +---------+---------------+---------+-----------+-----------------+------------+ SSV       Partial                            softly echogenic Acute        +---------+---------------+---------+-----------+-----------------+------------+   +----+---------------+---------+-----------+----------+--------------+ LEFTCompressibilityPhasicitySpontaneityPropertiesThrombus Aging +----+---------------+---------+-----------+----------+--------------+ CFV Full           Yes                                          +----+---------------+---------+-----------+----------+--------------+  Findings reported to [facility] at 16:30.  Summary: RIGHT: - Findings consistent with acute deep vein thrombosis involving the right femoral vein, right popliteal vein, right soleal veins, right gastrocnemius veins, and right intramuscular calf vein. - Findings consistent with acute superficial vein thrombosis involving the right great saphenous vein, and right small saphenous vein.  LEFT: - No evidence of common femoral vein obstruction. - Preliminary report was called to [facility] who advised patient to take Xarelto.  *See table(s) above for measurements and observations. Electronically signed by Harold Barban MD on 01/14/2021 at 6:11:30 PM.    Final     ASSESSMENT & PLAN:  Brandon [name] is a 70 y.o. male with a history of   1. 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  He presented with fatigue, weight loss, anorexia, and acute DVT and PE --CT A/P the same day showed a pancreatic tail lesion, innumerable new hepatic masses, and paraesophageal lymphadenopathy. I reviewed the report and discussed with pt, will request his scan images to be sent to [name]  -CA 19-9 on 01/18/21 was [phone]. -bilirubin increased from 0.9 on 5/12 and went up to 3.7 on 5/26.  He has obvious jaundice on exam today. -FNA of liver on 01/31/21 confirmed metastatic adenocarcinoma.  The IHC studies supported upper GI  primary, this is considered with pancreatic adenocarcinoma.  I reviewed with him today. -I reviewed the incurable nature of his metastatic disease, and overall very poor prognosis, due to his rapid worsening of liver function and poor performance status.   -I discussed treatment options, especially the option of chemotherapy, and the various chemo regimen today.  Due to his rapid worsening liver function, Abraxane and irinotecan are not very safe to give at this point.  I recommend starting with gemcitabine alone. If he tolerates this well and his liver functions improved significantly, we can consider adding abraxane later on. I reviewed that single agent gemcitabine has limited response rate, the goal of chemo is palliative, including prolonging his life and improving his quality of life  -Chemotherapy consent: Side effects including but does not not limited to, fatigue, nausea, vomiting, diarrhea, hair loss, neuropathy, fluid retention, renal and kidney dysfunction, neutropenic fever, needed for blood transfusion, bleeding, were discussed with patient in great detail. He agrees to proceed. -I also discussed the use of nutrition supplement during chemotherapy. I will refer him to our dietician.  2. Genetic testing -given his strong family history of cancer and his personal history of pancreatic cancer, I recommend genetic testing -I will refer him to our genetic counselor.  3. Goal of care -We discussed the incurable nature of his cancer, and the overall poor prognosis, especially if he does not have good response to chemotherapy or progress on chemo -The patient understands the goal of care is palliative. -He notes he is scheduled for a virtual visit with palliative care through [facility] tomorrow morning, 02/08/21. -We discussed the difference and benefits of palliative care and hospice.  4. Acute DVT of right calf and PE, b/l leg edema  -confirmed on doppler 01/14/21 and CTA on 01/31/2021 -He is on  Xarelto, right leg edema improved.  However he has developed bilateral leg/feet swelling and pain lately  -I recommend he try elevating his legs when he sits, and use compression socks while not laying down.   PLAN:  -Abdominal ultrasound to rule out a Pirie obstruction as well  as possible -Urgent genetic referral and lab -Nutritional consult -Palliative home care referral -Chemo class next week -Lab and weekly gemcitabine X2 starting next week  -f/u in 2 weeks    No orders of the defined types were placed in this encounter.   All questions were answered. The patient knows to call the clinic with any problems, questions or concerns. The total time spent in the appointment was 60 minutes.     Truitt Merle, MD 02/07/2021 6:06 PM  I, Wilburn Mylar, am acting as scribe for Truitt Merle, MD.   I have reviewed the above documentation for accuracy and completeness, and I agree with the above.

## 2021-02-07 NOTE — Progress Notes (Signed)
out

## 2021-02-08 ENCOUNTER — Telehealth: Payer: Self-pay

## 2021-02-08 ENCOUNTER — Other Ambulatory Visit: Payer: Self-pay

## 2021-02-08 DIAGNOSIS — C787 Secondary malignant neoplasm of liver and intrahepatic bile duct: Secondary | ICD-10-CM

## 2021-02-08 DIAGNOSIS — K5903 Drug induced constipation: Secondary | ICD-10-CM | POA: Diagnosis not present

## 2021-02-08 DIAGNOSIS — C259 Malignant neoplasm of pancreas, unspecified: Secondary | ICD-10-CM

## 2021-02-08 DIAGNOSIS — R530 Neoplastic (malignant) related fatigue: Secondary | ICD-10-CM | POA: Diagnosis not present

## 2021-02-08 DIAGNOSIS — G893 Neoplasm related pain (acute) (chronic): Secondary | ICD-10-CM | POA: Diagnosis not present

## 2021-02-08 DIAGNOSIS — T402X5A Adverse effect of other opioids, initial encounter: Secondary | ICD-10-CM | POA: Diagnosis not present

## 2021-02-08 DIAGNOSIS — Z515 Encounter for palliative care: Secondary | ICD-10-CM | POA: Diagnosis not present

## 2021-02-08 DIAGNOSIS — R1013 Epigastric pain: Secondary | ICD-10-CM | POA: Diagnosis not present

## 2021-02-08 DIAGNOSIS — R630 Anorexia: Secondary | ICD-10-CM | POA: Diagnosis not present

## 2021-02-08 NOTE — Progress Notes (Signed)
I spoke with patient regarding his appointments/times on 02/13/2021 explaining he will get his first treatment that day.  He verbalized an understanding and appreciated the call.

## 2021-02-08 NOTE — Telephone Encounter (Signed)
I spoke with Mr. Zingale to make him aware of his US Abdomen Complete scheduled for 02/12/21. I advised him to report to [facility] main entrance A on the 1st floor. NPO after 5:30 am that morning, and to arrive by 11:15 am for his 11:30 am appointment.

## 2021-02-09 ENCOUNTER — Encounter: Payer: Self-pay | Admitting: Hematology

## 2021-02-09 MED ORDER — PROCHLORPERAZINE MALEATE 10 MG PO TABS: 10.0000 mg | ORAL_TABLET | Freq: Four times a day (QID) | ORAL | 1 refills | Status: AC | PRN

## 2021-02-09 MED ORDER — ONDANSETRON HCL 8 MG PO TABS: 8.0000 mg | ORAL_TABLET | Freq: Two times a day (BID) | ORAL | 1 refills | Status: AC | PRN

## 2021-02-09 NOTE — Progress Notes (Signed)
START ON PATHWAY REGIMEN - Pancreatic Adenocarcinoma     A cycle is every 28 days:     Nab-paclitaxel (protein bound)      Gemcitabine   **Always confirm dose/schedule in your pharmacy ordering system**  Patient Characteristics: Metastatic Disease, First Line, PS  ?  2, BRCA1/2 and PALB2 Mutation Absent/Unknown Therapeutic Status: Metastatic Disease Line of Therapy: First Line ECOG Performance Status: 3 BRCA1/2 Mutation Status: Awaiting Test Results PALB2 Mutation Status: Awaiting Test Results Intent of Therapy: Non-Curative / Palliative Intent, Discussed with Patient

## 2021-02-11 ENCOUNTER — Telehealth: Payer: Self-pay

## 2021-02-11 ENCOUNTER — Encounter: Payer: Self-pay | Admitting: Hematology

## 2021-02-11 ENCOUNTER — Inpatient Hospital Stay: Admission: AD | Admit: 2021-02-11 | Payer: Medicare Other | Source: Ambulatory Visit | Admitting: Internal Medicine

## 2021-02-11 ENCOUNTER — Encounter: Payer: Self-pay | Admitting: *Deleted

## 2021-02-11 ENCOUNTER — Inpatient Hospital Stay: Payer: Medicare Other

## 2021-02-11 ENCOUNTER — Inpatient Hospital Stay (HOSPITAL_BASED_OUTPATIENT_CLINIC_OR_DEPARTMENT_OTHER): Payer: Medicare Other | Admitting: Hematology

## 2021-02-11 ENCOUNTER — Encounter (HOSPITAL_COMMUNITY): Payer: Self-pay | Admitting: Emergency Medicine

## 2021-02-11 ENCOUNTER — Inpatient Hospital Stay (HOSPITAL_COMMUNITY)
Admission: EM | Admit: 2021-02-11 | Discharge: 2021-02-13 | DRG: 641 | Disposition: A | Discharge status: Hospice | Payer: Medicare Other | Attending: Internal Medicine | Admitting: Internal Medicine

## 2021-02-11 ENCOUNTER — Other Ambulatory Visit: Payer: Self-pay

## 2021-02-11 ENCOUNTER — Emergency Department (HOSPITAL_COMMUNITY): Payer: Medicare Other

## 2021-02-11 ENCOUNTER — Other Ambulatory Visit: Payer: Self-pay | Admitting: *Deleted

## 2021-02-11 VITALS — BP 100/70 | HR 126 | Temp 97.8°F | Resp 19 | Ht 72.0 in | Wt 237.5 lb

## 2021-02-11 DIAGNOSIS — C259 Malignant neoplasm of pancreas, unspecified: Secondary | ICD-10-CM

## 2021-02-11 DIAGNOSIS — I824Y9 Acute embolism and thrombosis of unspecified deep veins of unspecified proximal lower extremity: Secondary | ICD-10-CM

## 2021-02-11 DIAGNOSIS — Z8042 Family history of malignant neoplasm of prostate: Secondary | ICD-10-CM

## 2021-02-11 DIAGNOSIS — Z66 Do not resuscitate: Secondary | ICD-10-CM | POA: Diagnosis present

## 2021-02-11 DIAGNOSIS — Z8051 Family history of malignant neoplasm of kidney: Secondary | ICD-10-CM

## 2021-02-11 DIAGNOSIS — Z9981 Dependence on supplemental oxygen: Secondary | ICD-10-CM | POA: Diagnosis not present

## 2021-02-11 DIAGNOSIS — I82409 Acute embolism and thrombosis of unspecified deep veins of unspecified lower extremity: Secondary | ICD-10-CM | POA: Diagnosis present

## 2021-02-11 DIAGNOSIS — E871 Hypo-osmolality and hyponatremia: Secondary | ICD-10-CM | POA: Diagnosis present

## 2021-02-11 DIAGNOSIS — E875 Hyperkalemia: Secondary | ICD-10-CM | POA: Diagnosis present

## 2021-02-11 DIAGNOSIS — Z886 Allergy status to analgesic agent status: Secondary | ICD-10-CM | POA: Diagnosis not present

## 2021-02-11 DIAGNOSIS — C787 Secondary malignant neoplasm of liver and intrahepatic bile duct: Secondary | ICD-10-CM

## 2021-02-11 DIAGNOSIS — D630 Anemia in neoplastic disease: Secondary | ICD-10-CM | POA: Diagnosis not present

## 2021-02-11 DIAGNOSIS — Z951 Presence of aortocoronary bypass graft: Secondary | ICD-10-CM

## 2021-02-11 DIAGNOSIS — I10 Essential (primary) hypertension: Secondary | ICD-10-CM | POA: Diagnosis not present

## 2021-02-11 DIAGNOSIS — Z83438 Family history of other disorder of lipoprotein metabolism and other lipidemia: Secondary | ICD-10-CM

## 2021-02-11 DIAGNOSIS — K6389 Other specified diseases of intestine: Secondary | ICD-10-CM | POA: Diagnosis not present

## 2021-02-11 DIAGNOSIS — K439 Ventral hernia without obstruction or gangrene: Secondary | ICD-10-CM | POA: Diagnosis present

## 2021-02-11 DIAGNOSIS — Z7189 Other specified counseling: Secondary | ICD-10-CM

## 2021-02-11 DIAGNOSIS — D72829 Elevated white blood cell count, unspecified: Secondary | ICD-10-CM | POA: Diagnosis not present

## 2021-02-11 DIAGNOSIS — Z944 Liver transplant status: Secondary | ICD-10-CM

## 2021-02-11 DIAGNOSIS — R6520 Severe sepsis without septic shock: Secondary | ICD-10-CM

## 2021-02-11 DIAGNOSIS — I517 Cardiomegaly: Secondary | ICD-10-CM | POA: Diagnosis not present

## 2021-02-11 DIAGNOSIS — Z87891 Personal history of nicotine dependence: Secondary | ICD-10-CM

## 2021-02-11 DIAGNOSIS — Z8249 Family history of ischemic heart disease and other diseases of the circulatory system: Secondary | ICD-10-CM

## 2021-02-11 DIAGNOSIS — Z515 Encounter for palliative care: Secondary | ICD-10-CM | POA: Diagnosis not present

## 2021-02-11 DIAGNOSIS — E785 Hyperlipidemia, unspecified: Secondary | ICD-10-CM | POA: Diagnosis present

## 2021-02-11 DIAGNOSIS — K7290 Hepatic failure, unspecified without coma: Secondary | ICD-10-CM | POA: Diagnosis present

## 2021-02-11 DIAGNOSIS — R7989 Other specified abnormal findings of blood chemistry: Secondary | ICD-10-CM | POA: Diagnosis not present

## 2021-02-11 DIAGNOSIS — I2510 Atherosclerotic heart disease of native coronary artery without angina pectoris: Secondary | ICD-10-CM | POA: Diagnosis present

## 2021-02-11 DIAGNOSIS — Z7901 Long term (current) use of anticoagulants: Secondary | ICD-10-CM

## 2021-02-11 DIAGNOSIS — E43 Unspecified severe protein-calorie malnutrition: Secondary | ICD-10-CM | POA: Diagnosis not present

## 2021-02-11 DIAGNOSIS — R300 Dysuria: Secondary | ICD-10-CM | POA: Diagnosis not present

## 2021-02-11 DIAGNOSIS — A419 Sepsis, unspecified organism: Secondary | ICD-10-CM | POA: Diagnosis not present

## 2021-02-11 DIAGNOSIS — D638 Anemia in other chronic diseases classified elsewhere: Secondary | ICD-10-CM | POA: Diagnosis present

## 2021-02-11 DIAGNOSIS — D84821 Immunodeficiency due to drugs: Secondary | ICD-10-CM | POA: Diagnosis present

## 2021-02-11 DIAGNOSIS — R0602 Shortness of breath: Secondary | ICD-10-CM | POA: Diagnosis not present

## 2021-02-11 DIAGNOSIS — J9811 Atelectasis: Secondary | ICD-10-CM | POA: Diagnosis present

## 2021-02-11 DIAGNOSIS — D72825 Bandemia: Secondary | ICD-10-CM | POA: Diagnosis not present

## 2021-02-11 DIAGNOSIS — Z6821 Body mass index (BMI) 21.0-21.9, adult: Secondary | ICD-10-CM | POA: Diagnosis not present

## 2021-02-11 DIAGNOSIS — Z86718 Personal history of other venous thrombosis and embolism: Secondary | ICD-10-CM

## 2021-02-11 DIAGNOSIS — I5032 Chronic diastolic (congestive) heart failure: Secondary | ICD-10-CM | POA: Diagnosis present

## 2021-02-11 DIAGNOSIS — Z79899 Other long term (current) drug therapy: Secondary | ICD-10-CM

## 2021-02-11 DIAGNOSIS — Z20822 Contact with and (suspected) exposure to covid-19: Secondary | ICD-10-CM | POA: Diagnosis present

## 2021-02-11 DIAGNOSIS — I110 Hypertensive heart disease with heart failure: Secondary | ICD-10-CM | POA: Diagnosis present

## 2021-02-11 DIAGNOSIS — N179 Acute kidney failure, unspecified: Secondary | ICD-10-CM | POA: Diagnosis present

## 2021-02-11 DIAGNOSIS — K7031 Alcoholic cirrhosis of liver with ascites: Secondary | ICD-10-CM | POA: Diagnosis present

## 2021-02-11 DIAGNOSIS — D6859 Other primary thrombophilia: Secondary | ICD-10-CM | POA: Diagnosis not present

## 2021-02-11 DIAGNOSIS — R509 Fever, unspecified: Secondary | ICD-10-CM | POA: Diagnosis not present

## 2021-02-11 DIAGNOSIS — Z86711 Personal history of pulmonary embolism: Secondary | ICD-10-CM

## 2021-02-11 DIAGNOSIS — R627 Adult failure to thrive: Secondary | ICD-10-CM | POA: Diagnosis present

## 2021-02-11 DIAGNOSIS — G4733 Obstructive sleep apnea (adult) (pediatric): Secondary | ICD-10-CM | POA: Diagnosis present

## 2021-02-11 DIAGNOSIS — I82411 Acute embolism and thrombosis of right femoral vein: Secondary | ICD-10-CM | POA: Diagnosis not present

## 2021-02-11 DIAGNOSIS — R531 Weakness: Secondary | ICD-10-CM | POA: Diagnosis not present

## 2021-02-11 DIAGNOSIS — Z808 Family history of malignant neoplasm of other organs or systems: Secondary | ICD-10-CM

## 2021-02-11 LAB — COMPREHENSIVE METABOLIC PANEL
ALT: 521 U/L — ABNORMAL HIGH (ref 0–44)
AST: 1335 U/L — ABNORMAL HIGH (ref 15–41)
Albumin: 2.1 g/dL — ABNORMAL LOW (ref 3.5–5.0)
Alkaline Phosphatase: 790 U/L — ABNORMAL HIGH (ref 38–126)
Anion gap: 11 (ref 5–15)
BUN: 42 mg/dL — ABNORMAL HIGH (ref 8–23)
CO2: 22 mmol/L (ref 22–32)
Calcium: 8.5 mg/dL — ABNORMAL LOW (ref 8.9–10.3)
Chloride: 91 mmol/L — ABNORMAL LOW (ref 98–111)
Creatinine, Ser: 2.73 mg/dL — ABNORMAL HIGH (ref 0.61–1.24)
GFR, Estimated: 24 mL/min — ABNORMAL LOW (ref >60)
Glucose, Bld: 120 mg/dL — ABNORMAL HIGH (ref 70–99)
Potassium: 5.5 mmol/L — ABNORMAL HIGH (ref 3.5–5.1)
Sodium: 124 mmol/L — ABNORMAL LOW (ref 135–145)
Total Bilirubin: 11.4 mg/dL — ABNORMAL HIGH (ref 0.3–1.2)
Total Protein: 6.1 g/dL — ABNORMAL LOW (ref 6.5–8.1)

## 2021-02-11 LAB — CBC WITH DIFFERENTIAL (CANCER CENTER ONLY)
Abs Immature Granulocytes: 0.33 10*3/uL — ABNORMAL HIGH (ref 0.00–0.07)
Basophils Absolute: 0.1 10*3/uL (ref 0.0–0.1)
Basophils Relative: 0 %
Eosinophils Absolute: 0 10*3/uL (ref 0.0–0.5)
Eosinophils Relative: 0 %
HCT: 32.1 % — ABNORMAL LOW (ref 39.0–52.0)
Hemoglobin: 11.3 g/dL — ABNORMAL LOW (ref 13.0–17.0)
Immature Granulocytes: 1 %
Lymphocytes Relative: 6 %
Lymphs Abs: 1.4 10*3/uL (ref 0.7–4.0)
MCH: 28.3 pg (ref 26.0–34.0)
MCHC: 35.2 g/dL (ref 30.0–36.0)
MCV: 80.3 fL (ref 80.0–100.0)
Monocytes Absolute: 2.7 10*3/uL — ABNORMAL HIGH (ref 0.1–1.0)
Monocytes Relative: 11 %
Neutro Abs: 19.5 10*3/uL — ABNORMAL HIGH (ref 1.7–7.7)
Neutrophils Relative %: 82 %
Platelet Count: 303 10*3/uL (ref 150–400)
RBC: 4 MIL/uL — ABNORMAL LOW (ref 4.22–5.81)
RDW: 15.6 % — ABNORMAL HIGH (ref 11.5–15.5)
WBC Count: 24 10*3/uL — ABNORMAL HIGH (ref 4.0–10.5)
nRBC: 0 % (ref 0.0–0.2)

## 2021-02-11 LAB — RESP PANEL BY RT-PCR (FLU A&B, COVID) ARPGX2
Influenza A by PCR: NEGATIVE
Influenza B by PCR: NEGATIVE
SARS Coronavirus 2 by RT PCR: NEGATIVE

## 2021-02-11 LAB — CBC WITH DIFFERENTIAL/PLATELET
Abs Immature Granulocytes: 0.29 10*3/uL — ABNORMAL HIGH (ref 0.00–0.07)
Basophils Absolute: 0 10*3/uL (ref 0.0–0.1)
Basophils Relative: 0 %
Eosinophils Absolute: 0 10*3/uL (ref 0.0–0.5)
Eosinophils Relative: 0 %
HCT: 30.5 % — ABNORMAL LOW (ref 39.0–52.0)
Hemoglobin: 10.3 g/dL — ABNORMAL LOW (ref 13.0–17.0)
Immature Granulocytes: 1 %
Lymphocytes Relative: 3 %
Lymphs Abs: 0.6 10*3/uL — ABNORMAL LOW (ref 0.7–4.0)
MCH: 27.5 pg (ref 26.0–34.0)
MCHC: 33.8 g/dL (ref 30.0–36.0)
MCV: 81.6 fL (ref 80.0–100.0)
Monocytes Absolute: 1.8 10*3/uL — ABNORMAL HIGH (ref 0.1–1.0)
Monocytes Relative: 9 %
Neutro Abs: 18 10*3/uL — ABNORMAL HIGH (ref 1.7–7.7)
Neutrophils Relative %: 87 %
Platelets: 256 10*3/uL (ref 150–400)
RBC: 3.74 MIL/uL — ABNORMAL LOW (ref 4.22–5.81)
RDW: 15.7 % — ABNORMAL HIGH (ref 11.5–15.5)
WBC: 20.7 10*3/uL — ABNORMAL HIGH (ref 4.0–10.5)
nRBC: 0 % (ref 0.0–0.2)

## 2021-02-11 LAB — CMP (CANCER CENTER ONLY)
ALT: 580 U/L (ref 0–44)
AST: 1405 U/L (ref 15–41)
Albumin: 2.1 g/dL — ABNORMAL LOW (ref 3.5–5.0)
Alkaline Phosphatase: 1054 U/L — ABNORMAL HIGH (ref 38–126)
Anion gap: 15 (ref 5–15)
BUN: 40 mg/dL — ABNORMAL HIGH (ref 8–23)
CO2: 22 mmol/L (ref 22–32)
Calcium: 9.7 mg/dL (ref 8.9–10.3)
Chloride: 90 mmol/L — ABNORMAL LOW (ref 98–111)
Creatinine: 2.91 mg/dL — ABNORMAL HIGH (ref 0.61–1.24)
GFR, Estimated: 23 mL/min — ABNORMAL LOW (ref >60)
Glucose, Bld: 100 mg/dL — ABNORMAL HIGH (ref 70–99)
Potassium: 5.7 mmol/L — ABNORMAL HIGH (ref 3.5–5.1)
Sodium: 127 mmol/L — ABNORMAL LOW (ref 135–145)
Total Bilirubin: 12.7 mg/dL (ref 0.3–1.2)
Total Protein: 6.5 g/dL (ref 6.5–8.1)

## 2021-02-11 LAB — PROTIME-INR
INR: 2.6 — ABNORMAL HIGH (ref 0.8–1.2)
Prothrombin Time: 27.9 seconds — ABNORMAL HIGH (ref 11.4–15.2)

## 2021-02-11 LAB — LACTIC ACID, PLASMA
Lactic Acid, Venous: 3.7 mmol/L (ref 0.5–1.9)
Lactic Acid, Venous: 1.6 mmol/L (ref 0.5–1.9)

## 2021-02-11 LAB — LIPASE, BLOOD
Lipase: 21 U/L (ref 11–51)
Lipase: 20 U/L (ref 11–51)

## 2021-02-11 LAB — APTT: aPTT: 48 seconds — ABNORMAL HIGH (ref 24–36)

## 2021-02-11 MED ORDER — ALBUTEROL SULFATE (2.5 MG/3ML) 0.083% IN NEBU: 2.5000 mg | INHALATION_SOLUTION | RESPIRATORY_TRACT | Status: DC | PRN

## 2021-02-11 MED ORDER — LINEZOLID 600 MG/300ML IV SOLN: 600.0000 mg | Freq: Once | INTRAVENOUS | Status: DC

## 2021-02-11 MED ORDER — SODIUM CHLORIDE 0.9 % IV BOLUS
1000.0000 mL | Freq: Once | INTRAVENOUS | Status: AC
  Administered 2021-02-11: 1000 mL via INTRAVENOUS

## 2021-02-11 MED ORDER — LACTATED RINGERS IV SOLN: INTRAVENOUS | Status: DC

## 2021-02-11 MED ORDER — LACTATED RINGERS IV BOLUS (SEPSIS)
1000.0000 mL | Freq: Once | INTRAVENOUS | Status: AC
  Administered 2021-02-11: 1000 mL via INTRAVENOUS

## 2021-02-11 MED ORDER — TAMSULOSIN HCL 0.4 MG PO CAPS
0.4000 mg | ORAL_CAPSULE | Freq: Every day | ORAL | Status: DC
  Administered 2021-02-12: 0.4 mg via ORAL
  Filled 2021-02-11: qty 1

## 2021-02-11 MED ORDER — SODIUM CHLORIDE 0.9 % IV SOLN
INTRAVENOUS | Status: DC
  Filled 2021-02-11: qty 250

## 2021-02-11 MED ORDER — METRONIDAZOLE 500 MG/100ML IV SOLN
500.0000 mg | Freq: Three times a day (TID) | INTRAVENOUS | Status: DC
  Filled 2021-02-11: qty 100

## 2021-02-11 MED ORDER — ONDANSETRON HCL 4 MG/2ML IJ SOLN
4.0000 mg | Freq: Four times a day (QID) | INTRAMUSCULAR | Status: DC | PRN
  Administered 2021-02-11: 4 mg via INTRAVENOUS
  Filled 2021-02-11: qty 2

## 2021-02-11 MED ORDER — METRONIDAZOLE 500 MG/100ML IV SOLN
500.0000 mg | Freq: Once | INTRAVENOUS | Status: AC
  Administered 2021-02-11: 500 mg via INTRAVENOUS
  Filled 2021-02-11: qty 100

## 2021-02-11 MED ORDER — SENNOSIDES-DOCUSATE SODIUM 8.6-50 MG PO TABS
1.0000 | ORAL_TABLET | Freq: Every evening | ORAL | Status: DC | PRN
  Administered 2021-02-12: 1 via ORAL
  Filled 2021-02-11: qty 1

## 2021-02-11 MED ORDER — SODIUM ZIRCONIUM CYCLOSILICATE 10 G PO PACK
10.0000 g | PACK | Freq: Once | ORAL | Status: AC
  Administered 2021-02-11: 10 g via ORAL
  Filled 2021-02-11: qty 1

## 2021-02-11 MED ORDER — MORPHINE SULFATE (PF) 2 MG/ML IV SOLN
2.0000 mg | INTRAVENOUS | Status: DC | PRN
  Administered 2021-02-11 – 2021-02-12 (×2): 2 mg via INTRAVENOUS
  Filled 2021-02-11 (×2): qty 1

## 2021-02-11 MED ORDER — LINEZOLID 600 MG/300ML IV SOLN
600.0000 mg | Freq: Two times a day (BID) | INTRAVENOUS | Status: DC
  Administered 2021-02-11: 600 mg via INTRAVENOUS
  Filled 2021-02-11 (×3): qty 300

## 2021-02-11 MED ORDER — ONDANSETRON HCL 4 MG PO TABS: 4.0000 mg | ORAL_TABLET | Freq: Four times a day (QID) | ORAL | Status: DC | PRN

## 2021-02-11 MED ORDER — RIVAROXABAN 20 MG PO TABS: 20.0000 mg | ORAL_TABLET | Freq: Every day | ORAL | Status: DC

## 2021-02-11 MED ORDER — SODIUM CHLORIDE 0.9 % IV SOLN
2.0000 g | Freq: Every day | INTRAVENOUS | Status: DC
  Administered 2021-02-11: 2 g via INTRAVENOUS
  Filled 2021-02-11: qty 2

## 2021-02-11 NOTE — Progress Notes (Signed)
Notified provider of need to order lactic acid and antibiotics. Blood cultures drawn earlier today.

## 2021-02-11 NOTE — ED Notes (Signed)
Patient is ready for transport.  

## 2021-02-11 NOTE — Progress Notes (Signed)
Critical Values: T. Bili 12.7 AST 1405 ALT 580 Dr Burr Medico notified

## 2021-02-11 NOTE — Progress Notes (Signed)
Met with patient/accompanying adult at registration to introduce myself as Financial Resource Specialist and to offer available resources.  Discussed one-time $1000 [employer] grant and qualifications to assist with personal expenses while going through treatment.  Gave him my card if interested in applying and for any additional financial questions or concerns. 

## 2021-02-11 NOTE — ED Provider Notes (Signed)
[facility] DEPT Provider Note   CSN: [id number] Arrival date & time: 02/11/21  1605     History No chief complaint on file.   Brandon [name] is a 70 y.o. male.  Pt presents to the ED today with worsening of abdominal pain.  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  Pt saw Dr. Burr Medico for the first time on 6/2.  She saw him again today because of increasing abdominal pain even with taking Tramadol.  The pt has been taking Miralax for constipation.  He did have a large bm yesterday, but has worsening pain today.  He has had chills and fever.  He also has sob and worsening swelling to both lower extremities.  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  He is on Xarelto.  He's been fully vaccinated against Covid + boosters.           Past Medical History:  Diagnosis Date  . BPH (benign prostatic hyperplasia)   . Cancer [facility])    Hepatic  . Coronary artery disease   . Hyperlipidemia   . Hypertension   . Liver failure ([place])    Cirrhosis- alcoholic- Liver transplant 2017  . Pneumonia    as a kid    Patient Active Problem List   Diagnosis Date Noted  . Elevated LFTs 02/11/2021  . Pancreatic cancer metastasized to liver ([place]) 02/07/2021  . DVT (deep venous thrombosis) ([place]) 01/30/2021  . Pancreatic mass 01/30/2021  . 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 Elevated blood pressure reading 06/11/2020  . Ventral hernia without obstruction or gangrene 06/11/2020  . Leg edema 08/09/2019  . Rash 08/09/2019  . Dyslipidemia 04/06/2019  . OSA (obstructive sleep apnea) 07/16/2018  . Erectile dysfunction 07/16/2018  . BPH 07/16/2018  . Frequent headaches 07/16/2018  . CAD s/p CABG 07/16/2018  . Liver transplant recipient [facility]) 11/01/2015    Past Surgical History:  Procedure Laterality Date  . COLONOSCOPY W/ POLYPECTOMY    . CORONARY ARTERY BYPASS GRAFT  07/2009  . LIVER  TRANSPLANT  2017  . ORIF CLAVICULAR FRACTURE Left 04/29/2019   Procedure: OPEN REDUCTION INTERNAL FIXATION (ORIF) CLAVICULAR FRACTURE;  Surgeon: Altamese Hometown, MD;  Location: [facility];  Service: Orthopedics;  Laterality: Left;       Family History  Problem Relation Age of Onset  . Heart disease Sister   . Heart disease Brother   . Cancer Brother 60       kidney cancer   . Hyperlipidemia Mother   . Hyperlipidemia Father   . Cancer Father        melanoma   . Heart disease Sister   . Liver disease Sister   . Cancer Sister        liver cancer  . Prostate cancer Brother   . Cancer Brother 60       prosteate cancer  . Colon cancer Neg Hx   . Pancreatic cancer Neg Hx   . Stomach cancer Neg Hx   . Esophageal cancer Neg Hx     Social History   Tobacco Use  . Smoking status: Former Smoker    Packs/day: 1.00    Years: 25.00    Pack years: 25.00    Types: Cigarettes    Quit date: 01/12/1998    Years since quitting: 23.0  . Smokeless tobacco: Never Used  Vaping Use  . Vaping Use: Never used  Substance Use  Topics  . 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 Drug use: Never    Home Medications Prior to Admission medications   Medication Sig Start Date End Date Taking? Authorizing Provider  calcium gluconate 500 MG tablet Take 1 tablet by mouth 2 (two) times daily.     [provider]  Everolimus 0.75 MG TABS Take by mouth.    [provider]  Fexofenadine HCl (ALLERGY 24-HR PO) Take 1 tablet by mouth daily.    [provider]  furosemide (LASIX) 40 MG tablet TAKE ONE TABLET BY MOUTH DAILY 10/31/20   [name], MD  gabapentin (NEURONTIN) 300 MG capsule Take 1 capsule (300 mg total) by mouth at bedtime. 12/11/20   [name], MD  losartan (COZAAR) 25 MG tablet Take 1 tablet (25 mg total) by mouth daily. 01/15/21   Jerline Pain, MD  Multiple Vitamin (MULTIVITAMIN WITH MINERALS) TABS tablet Take 1  tablet by mouth daily.    [provider]  Mycophenolate Sodium (MYCOPHENOLIC ACID PO) Take 846 mg by mouth 2 (two) times daily.    [provider]  ondansetron (ZOFRAN) 8 MG tablet Take 1 tablet (8 mg total) by mouth 2 (two) times daily as needed (Nausea or vomiting). 02/09/21   Truitt Merle, MD  prochlorperazine (COMPAZINE) 10 MG tablet Take 1 tablet (10 mg total) by mouth every 6 (six) hours as needed (Nausea or vomiting). 02/09/21   Truitt Merle, MD  rivaroxaban (XARELTO) 20 MG TABS tablet Take 1 tablet (20 mg total) by mouth daily with supper. Patient not taking: Reported on 01/30/2021 01/25/21 04/25/21  [name], MD  rosuvastatin (CRESTOR) 5 MG tablet TAKE ONE TABLET BY MOUTH DAILY 02/01/21   [name], MD  Syringe/Needle, Disp, (SYRINGE 3CC/25GX5/8") 25G X 5/8" 3 ML MISC Use to inject lovenox 01/23/21   [name], MD  tadalafil (CIALIS) 20 MG tablet TAKE ONE TABLET BY MOUTH DAILY 02/06/21   [name], MD  tamsulosin (FLOMAX) 0.4 MG CAPS capsule TAKE ONE CAPSULE BY MOUTH DAILY 11/11/20   [name], MD  traMADol (ULTRAM) 50 MG tablet Take 1 tablet (50 mg total) by mouth every 8 (eight) hours as needed for up to 90 doses. 01/30/21   [name], MD    Allergies    Nsaids  Review of Systems   Review of Systems  Constitutional: Positive for appetite change, chills and fever.  Respiratory: Positive for cough and shortness of breath.   Gastrointestinal: Positive for abdominal pain.  All other systems reviewed and are negative.   Physical Exam Updated Vital Signs BP 108/75   Pulse (!) 106   Temp 99.8 F (37.7 C) (Oral)   Resp (!) 23   Ht 5\' 11"  (1.803 m)   Wt 68 kg   SpO2 94%   BMI 20.92 kg/m   Physical Exam Vitals and nursing note reviewed.  Constitutional:      Appearance: He is ill-appearing.  HENT:     Head: Normocephalic and atraumatic.     Right Ear: External ear normal.     Left Ear: External ear normal.     Nose: Nose normal.      Mouth/Throat:     Mouth: Mucous membranes are dry.  Eyes:     Extraocular Movements: Extraocular movements intact.     Conjunctiva/sclera: Conjunctivae normal.     Pupils: Pupils  are equal, round, and reactive to light.  Cardiovascular:     Rate and Rhythm: Regular rhythm. Tachycardia present.     Pulses: Normal pulses.     Heart sounds: Normal heart sounds.  Pulmonary:     Effort: Tachypnea present.  Abdominal:     General: Abdomen is flat. Bowel sounds are normal.     Palpations: Abdomen is soft.  Musculoskeletal:     Cervical back: Normal range of motion and neck supple.     Right lower leg: Edema present.     Left lower leg: Edema present.  Skin:    Capillary Refill: Capillary refill takes less than 2 seconds.     Coloration: Skin is jaundiced.  Neurological:     General: No focal deficit present.     Mental Status: He is oriented to person, place, and time.  Psychiatric:        Mood and Affect: Mood normal.        Behavior: Behavior normal.        Thought Content: Thought content normal.        Judgment: Judgment normal.     ED Results / Procedures / Treatments   Labs (all labs ordered are listed, but only abnormal results are displayed) Labs Reviewed  CBC WITH DIFFERENTIAL/PLATELET - Abnormal; Notable for the following components:      Result Value   WBC 20.7 (*)    RBC 3.74 (*)    Hemoglobin 10.3 (*)    HCT 30.5 (*)    RDW 15.7 (*)    Neutro Abs 18.0 (*)    Lymphs Abs 0.6 (*)    Monocytes Absolute 1.8 (*)    Abs Immature Granulocytes 0.29 (*)    All other components within normal limits  COMPREHENSIVE METABOLIC PANEL - Abnormal; Notable for the following components:   Sodium 124 (*)    Potassium 5.5 (*)    Chloride 91 (*)    Glucose, Bld 120 (*)    BUN 42 (*)    Creatinine, Ser 2.73 (*)    Calcium 8.5 (*)    Total Protein 6.1 (*)    Albumin 2.1 (*)    AST 1,335 (*)    ALT 521 (*)    Alkaline Phosphatase 790 (*)    Total Bilirubin 11.4 (*)    GFR,  Estimated 24 (*)    All other components within normal limits  RESP PANEL BY RT-PCR (FLU A&B, COVID) ARPGX2  URINE CULTURE  LIPASE, BLOOD  URINALYSIS, ROUTINE W REFLEX MICROSCOPIC  PROTIME-INR  APTT    EKG None  Radiology CT ABDOMEN PELVIS WO CONTRAST  Result Date: 02/11/2021 CLINICAL DATA:  Abdominal pain and fever. Pancreatic cancer. History of liver transplant. EXAM: CT ABDOMEN AND PELVIS WITHOUT CONTRAST TECHNIQUE: Multidetector CT imaging of the abdomen and pelvis was performed following the standard protocol without IV contrast. COMPARISON:  CT 01/17/2021 at an outside institution. FINDINGS: Lower chest: Cardiomegaly with calcification of native coronary arteries. Median sternotomy. Right middle lobe nodule is calcified and consistent with prior granulomatous disease. Bandlike atelectasis within both lower lobes. Hepatobiliary: Heterogeneous liver with multiple ill-defined low-density liver lesions, better appreciated on prior contrast-enhanced exam. These appear more prominent and confluent within the left hepatic lobe. Surgical clips at the liver hilum. Direct comparison is difficult due to lack of IV contrast on the current exam. Pancreas: Hypodense pancreatic tail mass measures approximately 3.5 cm, similar to prior exam. No peripancreatic fat stranding or ductal dilatation. Spleen: Splenomegaly with  spleen spanning 17.2 cm in AP dimension. Previous splenic infarct is faintly visualized in the superior aspect, grossly unchanged. Adrenals/Urinary Tract: No adrenal nodule. Mild bilateral renal parenchymal thinning and perinephric edema. No hydronephrosis. No renal calculi. No evidence of focal renal lesion. Physiologically distended urinary bladder with equivocal wall thickening. Stomach/Bowel: Bowel evaluation is limited in the absence of contrast. Decompressed stomach. Occasional fluid-filled loops of small bowel without abnormal distension or obstruction. No obvious small bowel wall  thickening or inflammation. Air-filled appendix without appendicitis. There is submucosal fatty infiltration involving the ascending and transverse colon. Richter's type hernia involving the upper ventral abdominal wall containing anti mesenteric border of transverse colon. There is mild wall thickening of the adjacent colonic segment but no pericolonic inflammation. Liquid and solid stool in the descending and sigmoid colon. Occasional colonic diverticulosis without diverticulitis. Sigmoid colon is redundant. Vascular/Lymphatic: Aorto bi-iliac atherosclerosis. There is a 12 mm portal caval node, series 2, image 39, not significantly changed from prior. Multiple small retroperitoneal lymph nodes are not enlarged by size criteria. Reproductive: Mildly enlarged prostate gland spanning 5.3 cm transverse. Other: Small volume perihepatic ascites is new. Small volume of free fluid within both pericolic gutters and tracking into the pelvis. Serpiginous densities in the anterior upper omental fat correspond to vessels on prior exam. Supraumbilical ventral abdominal wall hernia now contains anti mesenteric border of transverse colon. There is an additional fat containing ventral abdominal wall hernia below the hernia containing transverse colon. This contains mild edema and trace fluid. Bilateral fat containing inguinal hernias. No free intra-abdominal air. Musculoskeletal: Bones are diffusely under mineralized. Sclerotic densities in the right acetabulum are again seen. No acute osseous abnormalities are seen. IMPRESSION: 1. Richter's type hernia involving the upper ventral abdominal wall containing anti mesenteric border of transverse colon. There is mild wall thickening of the adjacent transverse colon but no significant inflammation or evidence of obstruction. There is an additional fat containing ventral abdominal wall hernia below the hernia containing transverse colon. This contains mild edema and trace fluid. 2.  Pancreatic mass with hepatic metastasis. Multiple ill-defined low-density liver lesions, better appreciated on prior contrast-enhanced exam. These appear more prominent and confluent within the left hepatic lobe. 3. Small volume of ascites in the abdomen and pelvis is new. 4. Submucosal fatty infiltration involving the ascending and transverse colon, can be seen with chronic inflammation. 5. Splenomegaly. Splenic infarct is grossly stable from prior, not well-defined on the current exam in the absence of IV contrast. 6. Mild chronic bladder wall thickening. Mildly enlarged prostate gland. Aortic Atherosclerosis (ICD10-I70.0). Electronically Signed   By: Keith Rake M.D.   On: 02/11/2021 17:20   DG Chest Portable 1 View  Result Date: 02/11/2021 CLINICAL DATA:  Shortness of breath, fever EXAM: PORTABLE CHEST 1 VIEW COMPARISON:  Partial comparison to CT abdomen/pelvis dated 02/11/2021. Outside hospital CT chest dated 01/17/2021 FINDINGS: Mild bibasilar scarring/atelectasis, better evaluated on recent CT. No focal consolidation. No pleural effusion or pneumothorax. Cardiomegaly.  Postsurgical changes related to prior CABG. Status post ORIF of the left clavicle.  Median sternotomy. IMPRESSION: Mild bibasilar scarring/atelectasis. No evidence of acute cardiopulmonary disease. Electronically Signed   By: Julian Hy M.D.   On: 02/11/2021 17:48    Procedures Procedures   Medications Ordered in ED Medications  lactated ringers infusion (has no administration in time range)  lactated ringers bolus 1,000 mL (has no administration in time range)  metroNIDAZOLE (FLAGYL) IVPB 500 mg (has no administration in time range)  sodium chloride 0.9 %  bolus 1,000 mL (1,000 mLs Intravenous New Bag/Given 02/11/21 1641)    ED Course  I have reviewed the triage vital signs and the nursing notes.  Pertinent labs & imaging results that were available during my care of the patient were reviewed by me and considered  in my medical decision making (see chart for details).    MDM Rules/Calculators/A&P                          Pt given sepsis fluids.  He had a L at the cancer center and 2L here.  He was started on broad spectrum abx.  Results of CT abdomen d/w Gen Surg.  No indication for surgery as he is not obstructed.  He is not tender in the site of the Richter hernia.  Source of fever is unclear, but he's been unable to urinate.  He is neg for Covid and CXR is clear.  [name] Ram is pending.  I discussed code status with pt.  He is still unsure.  He will think about what he wants.  Pt d/w Dr. Starla Link (triad) for admission.  CRITICAL CARE Performed by: Isla Pence   Total critical care time: 30 minutes  Critical care time was exclusive of separately billable procedures and treating other patients.  Critical care was necessary to treat or prevent imminent or life-threatening deterioration.  Critical care was time spent personally by me on the following activities: development of treatment plan with patient and/or surrogate as well as nursing, discussions with consultants, evaluation of patient's response to treatment, examination of patient, obtaining history from patient or surrogate, ordering and performing treatments and interventions, ordering and review of laboratory studies, ordering and review of radiographic studies, pulse oximetry and re-evaluation of patient's condition.  Cain Fitzhenry was evaluated in Emergency Department on 02/11/2021 for the symptoms described in the history of present illness. He was evaluated in the context of the global COVID-19 pandemic, which necessitated consideration that the patient might be at risk for infection with the SARS-CoV-2 virus that causes COVID-19. Institutional protocols and algorithms that pertain to the evaluation of patients at risk for COVID-19 are in a state of rapid change based on information released by regulatory bodies including the CDC and  federal and state organizations. These policies and algorithms were followed during the patient's care in the ED.  Final Clinical Impression(s) / ED Diagnoses Final diagnoses:  Sepsis with acute renal failure without septic shock, due to unspecified organism, unspecified acute renal failure type ([place])  Elevated LFTs  Adenocarcinoma of pancreas ([place])  Hyponatremia    Rx / DC Orders ED Discharge Orders    None       Isla Pence, MD 02/11/21 1759

## 2021-02-11 NOTE — Progress Notes (Unsigned)
Lactic Acid of 3.7 given to Ihor Gully , RN at [phone] by Dorian Furnace (MT) 02/11/2021

## 2021-02-11 NOTE — ED Notes (Signed)
Patient and family have decided he would like to be a DNR.

## 2021-02-11 NOTE — Progress Notes (Signed)
Patient was receiving fluids waiting for a room to come available. Pt had chills, diaphoretic, eyes were a yellow tint. Upon checking vitals after fluids were started his temp had risen to 101.2. pulse in the 120s. Patient became lethargic and complained of pain in shoulders, stomach and back. rechecked blood pressure 92/62. Rapid response was called. Patient was moved to room 14 in the ED.

## 2021-02-11 NOTE — Significant Event (Addendum)
Rapid Response Event Note   Reason for Call :  Hypotensive    Initial Focused Assessment:  Patient a/o x4, states that he does not feel well and that he is willing to go the emergency department to be  evaluated. Skin is hot to touch, Primary nurse reports temp at 101.2, Pulse 102, b/p 92/62, O2 @ 2 liters per [place], SPo2 @ 92-98 %    Interventions:  Assist patient via wheelchair and primary nurse  to Emergency department room 14,  to be evaluated       MD Notified:  Call Time:  Arrival Time: End Time:  [place] Bruce Mayers, RN

## 2021-02-11 NOTE — ED Notes (Signed)
Patient daughter and wife at bedside. Patient is alert and oriented X4. Will message treatment team about patient deciding to be a DNR.

## 2021-02-11 NOTE — Progress Notes (Addendum)
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  Limited evaluation of the liver parenchyma. Multiple hepatic masses are  visualized. Additionally, the IVC demonstrates internal echoes concerning  for thrombus/mass. Recommend [place] protocol CT.  2.  Blood flow in the normal direction in the major hepatic blood vessels    01/17/2021 Imaging   CT A/P  Impression:  1.  Filling defect of a right lower lobe pulmonary vessel, incompletely visualized but likely representing pulmonary embolism. Adjacent small right pleural effusion. Recommend dedicated CT chest PE for further evaluation.  2.  Innumerable new hepatic masses concerning for metastatic disease. Biopsy recommended.  3.  Pancreatic tail lesion, which may represent primary pancreatic primary neoplasm versus metastasis.  4.  Paraesophageal lymphadenopathy concerning for nodal metastasis.    01/31/2021 Pathology Results   Liver Lesion FNA  Adenocarcinoma, see comment.   Comment: In the context of the patient's pancreatic mass, the morphology and immunophenotype are most consistent with a pancreaticobiliary primary.  The following immunohistochemistry was performed after review of the clinical history and morphology to further  characterize the pathologic process. The results are as follows:   A1-14    CK7                   Positive  A1-15    CK20                 Positive/weak     A1-16    CK19                 Positive  A1-17    CDX-2                Negative  A1-18    HEP PAR1         Negative    02/07/2021 Initial Diagnosis   Pancreatic cancer metastasized to liver ([place])   02/13/2021 -  Chemotherapy    Patient is on Treatment Plan: PANCREATIC ABRAXANE / GEMCITABINE D1,8,15 Q28D         CURRENT THERAPY:  PENDING   INTERVAL HISTORY:  Brandon [name] is here for a follow up. He was last seen by me 02/07/21. He presents to the clinic with wife. He notes yesterday he was very constipated. But after he had large BM from Laxatives, today he has upper abdominal pain. He notes with cough or deep breathes his pain flares and flares in back. He also still has lower alteral and upper back pain. He recently has BM every other day. He takes Miralax 1.5 capsules in the morning and 2 capsules at night. His wife notes he was given Senakot. He notes he is extremely tired all the time. He notes 2 days ago he slept all day and most of the day yesterday. He does have chills but no fever. He notes dry heaving with brushing his  teeth. He has been drinking liquid but little urinary output in the past 3 days. He has LE edema .    REVIEW OF SYSTEMS:   Constitutional: Denies fevers or abnormal weight loss (+) Fatigue (+) chills  Eyes: Denies blurriness of vision Ears, nose, mouth, throat, and face: Denies mucositis or sore throat  Respiratory: Denies cough, dyspnea or wheezes Cardiovascular: Denies palpitation, chest discomfort (+) lower extremity swelling UA: (+) Low urinary output  Gastrointestinal:  Denies nausea, heartburn or change in bowel habits (+) Upper abdominal pain.  Skin: Denies abnormal skin rashes MSK: (+) Lower lateral and upper back pain  Lymphatics: Denies new lymphadenopathy or easy bruising Neurological:Denies  numbness, tingling or new weaknesses Behavioral/Psych: Mood is stable, no new changes  All other systems were reviewed with the patient and are negative.  MEDICAL HISTORY:  Past Medical History:  Diagnosis Date  . BPH (benign prostatic hyperplasia)   . Cancer [facility])    Hepatic  . Coronary artery disease   . Hyperlipidemia   . Hypertension   . Liver failure ([place])    Cirrhosis- alcoholic- Liver transplant 2017  . Pneumonia    as a kid    SURGICAL HISTORY: Past Surgical History:  Procedure Laterality Date  . COLONOSCOPY W/ POLYPECTOMY    . CORONARY ARTERY BYPASS GRAFT  07/2009  . LIVER TRANSPLANT  2017  . ORIF CLAVICULAR FRACTURE Left 04/29/2019   Procedure: OPEN REDUCTION INTERNAL FIXATION (ORIF) CLAVICULAR FRACTURE;  Surgeon: Altamese [place], MD;  Location: [place];  Service: Orthopedics;  Laterality: Left;    I have reviewed the social history and family history with the patient and they are unchanged from previous note.  ALLERGIES:  is allergic to nsaids.  MEDICATIONS:  Current Outpatient Medications  Medication Sig Dispense Refill  . calcium gluconate 500 MG tablet Take 1 tablet by mouth 2 (two) times daily.     . Everolimus 0.75 MG TABS Take by mouth.    . Fexofenadine HCl (ALLERGY 24-HR PO) Take 1 tablet by mouth daily.    . furosemide (LASIX) 40 MG tablet TAKE ONE TABLET BY MOUTH DAILY 90 tablet 1  . gabapentin (NEURONTIN) 300 MG capsule Take 1 capsule (300 mg total) by mouth at bedtime. 90 capsule 3  . losartan (COZAAR) 25 MG tablet Take 1 tablet (25 mg total) by mouth daily. 90 tablet 2  . Multiple Vitamin (MULTIVITAMIN WITH MINERALS) TABS tablet Take 1 tablet by mouth daily.    . Mycophenolate Sodium (MYCOPHENOLIC ACID PO) Take 308 mg by mouth 2 (two) times daily.    . ondansetron (ZOFRAN) 8 MG tablet Take 1 tablet (8 mg total) by mouth 2 (two) times daily as needed (Nausea or vomiting). 30 tablet 1  . prochlorperazine (COMPAZINE) 10 MG tablet Take 1 tablet (10 mg  total) by mouth every 6 (six) hours as needed (Nausea or vomiting). 30 tablet 1  . rivaroxaban (XARELTO) 20 MG TABS tablet Take 1 tablet (20 mg total) by mouth daily with supper. (Patient not taking: Reported on 01/30/2021) 90 tablet 1  . rosuvastatin (CRESTOR) 5 MG tablet TAKE ONE TABLET BY MOUTH DAILY 60 tablet 0  . Syringe/Needle, Disp, (SYRINGE 3CC/25GX5/8") 25G X 5/8" 3 ML MISC Use to inject lovenox 50 each 0  . tadalafil (CIALIS) 20 MG tablet TAKE ONE TABLET BY MOUTH DAILY 30 tablet 5  . tamsulosin (FLOMAX) 0.4 MG CAPS capsule TAKE ONE CAPSULE BY MOUTH DAILY 30 capsule 5  . traMADol (ULTRAM) 50 MG tablet Take  1 tablet (50 mg total) by mouth every 8 (eight) hours as needed for up to 90 doses. 90 tablet 0   Current Facility-Administered Medications  Medication Dose Route Frequency Provider Last Rate Last Admin  . 0.9 %  sodium chloride infusion   Intravenous Continuous Truitt Merle, MD        PHYSICAL EXAMINATION: ECOG PERFORMANCE STATUS: 3 - Symptomatic, >50% confined to bed  Vitals:   02/11/21 1327  BP: 100/70  Pulse: (!) 126  Resp: 19  Temp: 97.8 F (36.6 C)  SpO2: 93%   Filed Weights   02/11/21 1327  Weight: 237 lb 8 oz (107.7 kg)    GENERAL:alert, no distress and comfortable SKIN: skin texture, turgor are normal, no rashes or significant lesions (+) Jaundice of skin  EYES: normal, Conjunctiva are pink and non-injected, sclera clear  NECK: supple, thyroid normal size, non-tender, without nodularity LYMPH:  no palpable lymphadenopathy in the cervical, axillary  LUNGS: clear to auscultation and percussion with normal breathing effort HEART: regular rate & rhythm and no murmurs and no lower extremity edema ABDOMEN:abdomen soft, non-tender and normal bowel sounds (+) Mid to left abdominal tenderness (+) Upper abdominal tenderness, no rebound tenderness  Musculoskeletal:no cyanosis of digits and no clubbing  NEURO: alert & oriented x 3 with fluent speech, no focal motor/sensory  deficits  LABORATORY DATA:  I have reviewed the data as listed CBC Latest Ref Rng & Units 02/11/2021 08/09/2019 04/29/2019  WBC 4.0 - 10.5 K/uL 24.0(H) 6.5 7.2  Hemoglobin 13.0 - 17.0 g/dL 11.3(L) 15.5 14.8  Hematocrit 39.0 - 52.0 % 32.1(L) 45.6 45.1  Platelets 150 - 400 K/uL 303 207.0 212     CMP Latest Ref Rng & Units 02/11/2021 08/09/2019 04/29/2019  Glucose 70 - 99 mg/dL 100(H) 95 97  BUN 8 - 23 mg/dL 40(H) 18 18  Creatinine 0.61 - 1.24 mg/dL 2.91(H) 1.20 1.11  Sodium 135 - 145 mmol/L 127(L) 138 138  Potassium 3.5 - 5.1 mmol/L 5.7(H) 4.2 3.9  Chloride 98 - 111 mmol/L 90(L) 102 104  CO2 22 - 32 mmol/L '22 27 23  ' Calcium 8.9 - 10.3 mg/dL 9.7 9.9 9.3  Total Protein 6.5 - 8.1 g/dL 6.5 6.7 6.8  Total Bilirubin 0.3 - 1.2 mg/dL 12.7(HH) 0.8 1.2  Alkaline Phos 38 - 126 U/L 1,054(H) 96 84  AST 15 - 41 U/L 1,405(HH) 23 36  ALT 0 - 44 U/L 580(HH) 25 45(H)      RADIOGRAPHIC STUDIES: I have personally reviewed the radiological images as listed and agreed with the findings in the report. No results found.   ASSESSMENT & PLAN:  Brandon [name] is a 70 y.o. male with    1. Worsening upper abdominal pain, constipation, Jaundice  -He has been constipated with BM every 2 days. He uses 1.5-2 tabs of Miralax daily. He had large BM yesterday, but since has new onset abdominal pain. This is exacerbated by deep breathing and coughing. In recent days he has worsened fatigue. He has chills without fever. He has been drinking adequately but still has low urine output.  -On exam his jaundice has worsened with severe tenderness of upper abdomen. CBC shows WBC 24, Hg 11.3 with significantly worsened LFTs AST 1405, ALT 580, Alk Phos 1054, Tbili 12.7.  -I discussed his pain and symptoms are likley related to his cancer in the liver, but will rule out infection, biliary obstruction etc. I recommend hospital admission to get this done sooner. He will need CT AP. He  is agreeable. While he waits for admission, will  given IV fluids and do blood and urine culture.    2. 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 He presented with fatigue, weight loss, anorexia, and acute DVT and PE -02/07/21 CT A/P showed a pancreatic tail lesion, innumerable new hepatic masses, and paraesophageal lymphadenopathy.  -CA 19-9 on 01/18/21 was [phone]. Bilirubin increased from 0.9 on 5/12 and went up to 3.7 on 5/26.  -FNA of liver on 01/31/21 confirmed metastatic adenocarcinoma. The IHC studies supported upper GI primary, this is considered with pancreatic adenocarcinoma.  -His cancer is stage IV and no longer curable and overall very poor prognosis, due to his rapid worsening of liver function and poor performance status.   -I discussed treatment options, especially the option of chemotherapy, and the various chemo regimen. Due to his rapid worsening liver function, Abraxane and irinotecan are not very safe to give at this point.  I recommend starting with gemcitabine alone. If he tolerates this well and his liver functions improved significantly, we can consider adding abraxane later on. I reviewed that single agent gemcitabine has limited response rate, the goal of chemo is palliative, including prolonging his life and improving his quality of life. He agreed to proceed with Gemcitabine.  -Given his worsened performance status and liver function, will postpone start of treatment and admit to hospital for further eval and treatment. If his liver function is related to his cancer and cannot be corrected, he will no longer be eligible for chemotherapy treatment. He voiced good understanding.  -will likely transition him to hospice.   3. Genetic testing -given his strong family history of cancer and his personal history of pancreatic cancer. I previously referred him to genetics.   4. Goal of care -We previously discussed the incurable nature of his  cancer, and the overall poor prognosis, especially if he does not have good response to chemotherapy or progress on chemo -The patient understands the goal of care is palliative. -He currently is under palliative home care.   5. Acute DVT of right calf and PE, b/l leg edema  -confirmed on doppler 01/14/21 and CTA on 01/31/2021 -He is on Xarelto, right leg edema improved. However he has developed bilateral leg/feet swelling and pain lately  -I recommend he try elevating his legs when he sits, and use compression socks while not laying down. -He still has significant LE edema.    PLAN:  -IV Fluids, blood and blood/urine culture today  -will send him to [facility] ED or direct admit to [facility] hospital for further evaluation and managment. I will follow his care through hospitalization. I spoke with hospitalist Dr. Posey Pronto and he agrees to admit him when bed is available.    No problem-specific Assessment & Plan notes found for this encounter.   Orders Placed This Encounter  Procedures  . Culture, Blood    Standing Status:   Future    Number of Occurrences:   1    Standing Expiration Date:   02/11/2022  . Culture, Blood    Standing Status:   Future    Number of Occurrences:   1    Standing Expiration Date:   02/11/2022  . Urine Culture    Standing Status:   Future    Number of Occurrences:   1    Standing Expiration Date:   02/11/2022  . Urinalysis, Complete w Microscopic  Standing Status:   Future    Number of Occurrences:   1    Standing Expiration Date:   02/11/2022  . Lactic acid, plasma    Standing Status:   Future    Number of Occurrences:   1    Standing Expiration Date:   02/11/2022  . Lipase, blood    Standing Status:   Future    Number of Occurrences:   1    Standing Expiration Date:   02/11/2022   All questions were answered. The patient knows to call the clinic with any problems, questions or concerns. No barriers to learning was detected. The total time spent in the appointment was 40  minutes.     Truitt Merle, MD 02/11/2021   I, Joslyn Devon, am acting as scribe for Truitt Merle, MD.   I have reviewed the above documentation for accuracy and completeness, and I agree with the above.   Addendum  Patient developed fever and tach apnea in our infusion room.  We sent him to the emergency room.  I discussed the CODE STATUS with patient and his wife in the emergency room.  I recommend DNR/DNI due to his metastatic cancer, and the multiorgan failures.  Patient and his wife would like to have some time to think about and discuss.  His family wants to come in to see him.  We discussed that we will treat reversible causes, otherwise likely transition him to hospice.  He was receptive. I will f/u after hospital admission.  Please consult palliative care.   Truitt Merle  02/11/2021

## 2021-02-11 NOTE — Progress Notes (Signed)
Patient's wife Jerene Pitch calls with concerns.  Over the weekend very poor appetite with c/o bloating.  Onset this morning of severe intermittent abdominal pains that "take his breath away".  He has been taking Tramadol every 8 hours and Gas-X.  He had large water bowel movement this movement. Will discuss with Dr. Burr Medico.   She is seeking advice on recommendation to help with his pain.

## 2021-02-11 NOTE — Progress Notes (Signed)
Per Dr. Burr Medico offered for patient to come in and lab work and see Dr. Burr Medico as the concern is that his liver function has worsened.  They agreed.  He will come in today for labs at 12:45 and see Dr. Burr Medico at 1:20.  Appointments have been made.

## 2021-02-11 NOTE — Telephone Encounter (Signed)
Critical Value: Lactic Acid 3.7. Pt is in ED via rapid response. I gave this critical value to the charge nurse, Marzetta Board.

## 2021-02-11 NOTE — Progress Notes (Signed)
Elink following for code sepsis 

## 2021-02-11 NOTE — H&P (Signed)
History and Physical    Brandon [name] NTI:[id number] DOB: 09-Feb-1951 DOA: 02/11/2021  PCP: [name], MD   Patient coming from: Home  I have personally briefly reviewed patient's old medical records in [place]  Chief Complaint: Abdominal pain and jaundice.  HPI: Brandon [name] is a 70 y.o. 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 Ernestina Penna office today with worsening abdominal pain and jaundice.  He also complains of increasing fatigue with no energy and patient slept most of the day for the last 2 days.  He complains of chills but no fever at home.  He also complains of decreased urine output and lower extremity swelling.  At cancer center, patient developed fever and tachypnea and was sent to the emergency room.  ED Course: He was found to have leukocytosis with white count of 24 and subsequently 20.7, sodium of 124, potassium of 5.5, BUN of 42, creatinine of 2.73 from 2.91 with AST of 1335, ALT of 521, alkaline phosphatase of 790 and bili total of 11.4.  CT of the abdomen and pelvis without contrast showed hernia without obstruction and pancreatic mass which hepatic metastases with small volume ascites and splenomegaly.  Chest x-ray showed mild basilar atelectasis.  He was started on IV fluids and broad-spectrum antibiotics.  Hospitalist service was called to evaluate the patient.  Review of Systems: As per HPI otherwise all other systems were reviewed and are negative.   Past Medical History:  Diagnosis Date  . BPH (benign prostatic hyperplasia)   . Cancer [facility])    Hepatic  . Coronary artery disease   .  Hyperlipidemia   . Hypertension   . Liver failure ([place])    Cirrhosis- alcoholic- Liver transplant 2017  . Pneumonia    as a kid    Past Surgical History:  Procedure Laterality Date  . COLONOSCOPY W/ POLYPECTOMY    . CORONARY ARTERY BYPASS GRAFT  07/2009  . LIVER TRANSPLANT  2017  . ORIF CLAVICULAR FRACTURE Left 04/29/2019   Procedure: OPEN REDUCTION INTERNAL FIXATION (ORIF) CLAVICULAR FRACTURE;  Surgeon: Alt[place], MD;  Location: [facility];  Service: Orthopedics;  Laterality: Left;     reports that he quit smoking about 23 years ago. His smoking use included cigarettes. He has a 25.00 pack-year smoking history. He has never used smokeless tobacco. He reports previous alcohol use. He reports that he does not use drugs.  Allergies  Allergen Reactions  . Nsaids Other (See Comments)    Liver transplant 09/13/2015     Family History  Problem Relation Age of Onset  . Heart disease Sister   . Heart disease Brother   . Cancer Brother 60       kidney cancer   . Hyperlipidemia Mother   . Hyperlipidemia Father   . Cancer Father        melanoma   . Heart disease Sister   . Liver disease Sister   . Cancer Sister        liver cancer  . Prostate cancer Brother   . Cancer Brother 60       prosteate cancer  . Colon cancer Neg Hx   . Pancreatic  cancer Neg Hx   . Stomach cancer Neg Hx   . Esophageal cancer Neg Hx     Prior to Admission medications   Medication Sig Start Date End Date Taking? Authorizing Provider  calcium gluconate 500 MG tablet Take 1 tablet by mouth 2 (two) times daily.     [provider]  Everolimus 0.75 MG TABS Take by mouth.    [provider]  Fexofenadine HCl (ALLERGY 24-HR PO) Take 1 tablet by mouth daily.    [provider]  furosemide (LASIX) 40 MG tablet TAKE ONE TABLET BY MOUTH DAILY 10/31/20   [name], MD  gabapentin (NEURONTIN) 300 MG capsule Take 1 capsule (300 mg total) by mouth at bedtime. 12/11/20   [name],  MD  losartan (COZAAR) 25 MG tablet Take 1 tablet (25 mg total) by mouth daily. 01/15/21   Jerline Pain, MD  Multiple Vitamin (MULTIVITAMIN WITH MINERALS) TABS tablet Take 1 tablet by mouth daily.    [provider]  Mycophenolate Sodium (MYCOPHENOLIC ACID PO) Take 009 mg by mouth 2 (two) times daily.    [provider]  ondansetron (ZOFRAN) 8 MG tablet Take 1 tablet (8 mg total) by mouth 2 (two) times daily as needed (Nausea or vomiting). 02/09/21   Truitt Merle, MD  prochlorperazine (COMPAZINE) 10 MG tablet Take 1 tablet (10 mg total) by mouth every 6 (six) hours as needed (Nausea or vomiting). 02/09/21   Truitt Merle, MD  rivaroxaban (XARELTO) 20 MG TABS tablet Take 1 tablet (20 mg total) by mouth daily with supper. Patient not taking: Reported on 01/30/2021 01/25/21 04/25/21  [name], MD  rosuvastatin (CRESTOR) 5 MG tablet TAKE ONE TABLET BY MOUTH DAILY 02/01/21   [name], MD  Syringe/Needle, Disp, (SYRINGE 3CC/25GX5/8") 25G X 5/8" 3 ML MISC Use to inject lovenox 01/23/21   [name], MD  tadalafil (CIALIS) 20 MG tablet TAKE ONE TABLET BY MOUTH DAILY 02/06/21   [name], MD  tamsulosin (FLOMAX) 0.4 MG CAPS capsule TAKE ONE CAPSULE BY MOUTH DAILY 11/11/20   [name], MD  traMADol (ULTRAM) 50 MG tablet Take 1 tablet (50 mg total) by mouth every 8 (eight) hours as needed for up to 90 doses. 01/30/21   [name], MD    Physical Exam: Vitals:   02/11/21 1625 02/11/21 1630 02/11/21 1730 02/11/21 1800  BP:  108/75  115/86  Pulse:  (!) 112 (!) 106 (!) 105  Resp:  (!) 22 (!) 23 (!) 23  Temp:      TempSrc:      SpO2:  96% 94% 95%  Weight:      Height: 5\' 11"  (1.803 m)       Constitutional: Looks chronically ill and deconditioned.  Currently on room air. Vitals:   02/11/21 1625 02/11/21 1630 02/11/21 1730 02/11/21 1800  BP:  108/75  115/86  Pulse:  (!) 112 (!) 106 (!) 105  Resp:  (!) 22 (!) 23 (!) 23  Temp:      TempSrc:      SpO2:  96% 94% 95%   Weight:      Height: 5\' 11"  (1.803 m)      Eyes: PERRL, patient is icteric. ENMT: Mucous membranes are dry.  Posterior pharynx clear of any exudate or lesions. Neck: normal, supple, no masses, no thyromegaly Respiratory: bilateral decreased breath sounds at bases, no wheezing; scattered crackles.  Slightly tachypneic.  No accessory muscle use.  Cardiovascular: S1 S2 positive, tachycardic.  Bilateral lower extremities 2-2+ pedal edema present.  2+ pedal pulses.  Abdomen: Distended, nontender, no masses palpated. No hepatosplenomegaly. Bowel sounds positive.  Musculoskeletal: no clubbing / cyanosis. No joint deformity upper and lower extremities.  Skin: no rashes, lesions, ulcers. No induration Neurologic: CN 2-12 grossly intact. Moving extremities. No focal neurologic deficits.  Psychiatric: Flat affect.  Intermittently gets anxious.   Labs on Admission: I have personally reviewed following labs and imaging studies  CBC: Recent Labs  Lab 02/11/21 1301 02/11/21 1623  WBC 24.0* 20.7*  NEUTROABS 19.5* 18.0*  HGB 11.3* 10.3*  HCT 32.1* 30.5*  MCV 80.3 81.6  PLT 303 680   Basic Metabolic Panel: Recent Labs  Lab 02/11/21 1301 02/11/21 1623  NA 127* 124*  K 5.7* 5.5*  CL 90* 91*  CO2 22 22  GLUCOSE 100* 120*  BUN 40* 42*  CREATININE 2.91* 2.73*  CALCIUM 9.7 8.5*   GFR: Estimated Creatinine Clearance: 24.6 mL/min (A) (by C-G formula based on SCr of 2.73 mg/dL (H)). Liver Function Tests: Recent Labs  Lab 02/11/21 1301 02/11/21 1623  AST 1,405* 1,335*  ALT 580* 521*  ALKPHOS 1,054* 790*  BILITOT 12.7* 11.4*  PROT 6.5 6.1*  ALBUMIN 2.1* 2.1*   Recent Labs  Lab 02/11/21 1440 02/11/21 1623  LIPASE 21 20   No results for input(s): AMMONIA in the last 168 hours. Coagulation Profile: No results for input(s): INR, PROTIME in the last 168 hours. Cardiac Enzymes: No results for input(s): CKTOTAL, CKMB, CKMBINDEX, TROPONINI in the last 168 hours. BNP (last 3  results) No results for input(s): PROBNP in the last 8760 hours. HbA1C: No results for input(s): HGBA1C in the last 72 hours. CBG: No results for input(s): GLUCAP in the last 168 hours. Lipid Profile: No results for input(s): CHOL, HDL, LDLCALC, TRIG, CHOLHDL, LDLDIRECT in the last 72 hours. Thyroid Function Tests: No results for input(s): TSH, T4TOTAL, FREET4, T3FREE, THYROIDAB in the last 72 hours. Anemia Panel: No results for input(s): VITAMINB12, FOLATE, FERRITIN, TIBC, IRON, RETICCTPCT in the last 72 hours. Urine analysis:    Component Value Date/Time   COLORURINE YELLOW 07/31/2009 0614   APPEARANCEUR CLOUDY (A) 07/31/2009 0614   LABSPEC 1.014 07/31/2009 0614   PHURINE 5.0 07/31/2009 [place] 07/31/2009 0614   HGBUR NEGATIVE 07/31/2009 0614   BILIRUBINUR negative 06/11/2020 0828   KETONESUR NEGATIVE 07/31/2009 0614   PROTEINUR Negative 06/11/2020 0828   PROTEINUR NEGATIVE 07/31/2009 0614   UROBILINOGEN 0.2 06/11/2020 0828   UROBILINOGEN 0.2 07/31/2009 0614   NITRITE negative 06/11/2020 0828   NITRITE NEGATIVE 07/31/2009 0614   LEUKOCYTESUR Negative 06/11/2020 0828    Radiological Exams on Admission: CT ABDOMEN PELVIS WO CONTRAST  Result Date: 02/11/2021 CLINICAL DATA:  Abdominal pain and fever. Pancreatic cancer. History of liver transplant. EXAM: CT ABDOMEN AND PELVIS WITHOUT CONTRAST TECHNIQUE: Multidetector CT imaging of the abdomen and pelvis was performed following the standard protocol without IV contrast. COMPARISON:  CT 01/17/2021 at an outside institution. FINDINGS: Lower chest: Cardiomegaly with calcification of native coronary arteries. Median sternotomy. Right middle lobe nodule is calcified and consistent with prior granulomatous disease. Bandlike atelectasis within both lower lobes. Hepatobiliary: Heterogeneous liver with multiple ill-defined low-density liver lesions, better appreciated on prior contrast-enhanced exam. These appear more prominent  and confluent within the left hepatic lobe. Surgical clips at the liver hilum. Direct comparison is difficult due to lack of IV contrast on the current exam. Pancreas: Hypodense pancreatic tail  mass measures approximately 3.5 cm, similar to prior exam. No peripancreatic fat stranding or ductal dilatation. Spleen: Splenomegaly with spleen spanning 17.2 cm in AP dimension. Previous splenic infarct is faintly visualized in the superior aspect, grossly unchanged. Adrenals/Urinary Tract: No adrenal nodule. Mild bilateral renal parenchymal thinning and perinephric edema. No hydronephrosis. No renal calculi. No evidence of focal renal lesion. Physiologically distended urinary bladder with equivocal wall thickening. Stomach/Bowel: Bowel evaluation is limited in the absence of contrast. Decompressed stomach. Occasional fluid-filled loops of small bowel without abnormal distension or obstruction. No obvious small bowel wall thickening or inflammation. Air-filled appendix without appendicitis. There is submucosal fatty infiltration involving the ascending and transverse colon. Richter's type hernia involving the upper ventral abdominal wall containing anti mesenteric border of transverse colon. There is mild wall thickening of the adjacent colonic segment but no pericolonic inflammation. Liquid and solid stool in the descending and sigmoid colon. Occasional colonic diverticulosis without diverticulitis. Sigmoid colon is redundant. Vascular/Lymphatic: Aorto bi-iliac atherosclerosis. There is a 12 mm portal caval node, series 2, image 39, not significantly changed from prior. Multiple small retroperitoneal lymph nodes are not enlarged by size criteria. Reproductive: Mildly enlarged prostate gland spanning 5.3 cm transverse. Other: Small volume perihepatic ascites is new. Small volume of free fluid within both pericolic gutters and tracking into the pelvis. Serpiginous densities in the anterior upper omental fat correspond to  vessels on prior exam. Supraumbilical ventral abdominal wall hernia now contains anti mesenteric border of transverse colon. There is an additional fat containing ventral abdominal wall hernia below the hernia containing transverse colon. This contains mild edema and trace fluid. Bilateral fat containing inguinal hernias. No free intra-abdominal air. Musculoskeletal: Bones are diffusely under mineralized. Sclerotic densities in the right acetabulum are again seen. No acute osseous abnormalities are seen. IMPRESSION: 1. Richter's type hernia involving the upper ventral abdominal wall containing anti mesenteric border of transverse colon. There is mild wall thickening of the adjacent transverse colon but no significant inflammation or evidence of obstruction. There is an additional fat containing ventral abdominal wall hernia below the hernia containing transverse colon. This contains mild edema and trace fluid. 2. Pancreatic mass with hepatic metastasis. Multiple ill-defined low-density liver lesions, better appreciated on prior contrast-enhanced exam. These appear more prominent and confluent within the left hepatic lobe. 3. Small volume of ascites in the abdomen and pelvis is new. 4. Submucosal fatty infiltration involving the ascending and transverse colon, can be seen with chronic inflammation. 5. Splenomegaly. Splenic infarct is grossly stable from prior, not well-defined on the current exam in the absence of IV contrast. 6. Mild chronic bladder wall thickening. Mildly enlarged prostate gland. Aortic Atherosclerosis (ICD10-I70.0). Electronically Signed   By: Keith Rake M.D.   On: 02/11/2021 17:20   DG Chest Portable 1 View  Result Date: 02/11/2021 CLINICAL DATA:  Shortness of breath, fever EXAM: PORTABLE CHEST 1 VIEW COMPARISON:  Partial comparison to CT abdomen/pelvis dated 02/11/2021. Outside hospital CT chest dated 01/17/2021 FINDINGS: Mild bibasilar scarring/atelectasis, better evaluated on recent  CT. No focal consolidation. No pleural effusion or pneumothorax. Cardiomegaly.  Postsurgical changes related to prior CABG. Status post ORIF of the left clavicle.  Median sternotomy. IMPRESSION: Mild bibasilar scarring/atelectasis. No evidence of acute cardiopulmonary disease. Electronically Signed   By: Julian Hy M.D.   On: 02/11/2021 17:48      Assessment/Plan  Possible severe sepsis: Questionable cause Leukocytosis Acute renal failure Goals of care Elevated LFTs with worsening bilirubin Pancreatic adenocarcinoma metastatic to liver, stage IV  with worsening -Patient presented with fever, tachycardia, tachypnea, leukocytosis, elevated lactic acid with no obvious source of infection.  Chest x-ray was negative for acute infiltrate.  CT of the abdomen showing worsening pancreatic cancer with hepatic metastases -Influenza and COVID testing negative on admission.  Follow blood cultures. -Patient received IV fluid bolus in the ED.  Continue maintenance IV fluid.  Blood pressure on the lower side.  We will also have to be careful as patient has lower extremity swelling as well. -Continue broad-spectrum antibiotics for now. -LFTs extremely worsening.  Monitor. -It looks like patient is in multiorgan failure and overall prognosis is very poor.  CODE STATUS was discussed by Dr. Burr Medico in the office and by myself in the ED as well.  Currently still thinking about it and wants to be full code.  Overall prognosis is very poor.  Recommend hospice/comfort measures.  Palliative care consultation.  Hyponatremia -Continue IV fluids.  Monitor  Hyperkalemia -We will give 1 dose of Lokelma.  Repeat a.m. labs.  Anemia of chronic disease -From cancer  Hernia seen on CT of the abdomen -No signs of obstruction.  ED provider spoke to general surgery on-call who recommended no intervention since there is no obstruction.  Recent diagnosis of acute DVT of right calf and PE with bilateral lower extremity  edema -Confirmed with Doppler on 01/14/2021 and CTA on 01/31/2021. -Continue Xarelto.  Hypertension -Blood pressure on the lower side.  Antihypertensives on hold.  CAD status post CABG -Currently stable.  Home meds on hold for now.  History of cirrhosis of liver status post liver transplant in 2017  DVT prophylaxis: Xarelto Code Status: Full Family Communication: Wife at bedside Disposition Plan: Remains on clinical outcome Consults called: Palliative care.  We will add oncology to treatment team. Admission status: Inpatient/stepdown  Severity of Illness: The appropriate patient status for this patient is INPATIENT. Inpatient status is judged to be reasonable and necessary in order to provide the required intensity of service to ensure the patient's safety. The patient's presenting symptoms, physical exam findings, and initial radiographic and laboratory data in the context of their chronic comorbidities is felt to place them at high risk for further clinical deterioration. Furthermore, it is not anticipated that the patient will be medically stable for discharge from the hospital within 2 midnights of admission. The following factors support the patient status of inpatient.   " The patient's presenting symptoms include increasing abdominal pain and jaundice. " The worrisome physical exam findings include jaundice/bilateral lower extremity edema. " The initial radiographic and laboratory data are worrisome because of acute renal failure/elevated LFTs/leukocytosis/hyponatremia/hyperkalemia/pancreatic cancer with metastases. " The chronic co-morbidities include stage IV pancreatic cancer/CAD status post CABG/hypertension.   * I certify that at the point of admission it is my clinical judgment that the patient will require inpatient hospital care spanning beyond 2 midnights from the point of admission due to high intensity of service, high risk for further deterioration and high frequency of  surveillance required.Aline August MD [facility]  02/11/2021, 6:11 PM

## 2021-02-11 NOTE — ED Notes (Signed)
Sent message to [facility] Floor Coverage for PRN pain medication for patient. Patient reports taking tramadol at home, with no relief.

## 2021-02-11 NOTE — ED Triage Notes (Addendum)
a 

## 2021-02-12 ENCOUNTER — Other Ambulatory Visit: Payer: Medicare Other

## 2021-02-12 ENCOUNTER — Ambulatory Visit (HOSPITAL_COMMUNITY): Payer: Medicare Other

## 2021-02-12 ENCOUNTER — Encounter: Payer: Medicare Other | Admitting: Genetic Counselor

## 2021-02-12 ENCOUNTER — Encounter (HOSPITAL_COMMUNITY): Payer: Self-pay | Admitting: Internal Medicine

## 2021-02-12 ENCOUNTER — Encounter (HOSPITAL_COMMUNITY): Payer: Self-pay

## 2021-02-12 ENCOUNTER — Telehealth: Payer: Self-pay | Admitting: *Deleted

## 2021-02-12 ENCOUNTER — Telehealth: Payer: Self-pay | Admitting: Hematology

## 2021-02-12 DIAGNOSIS — R531 Weakness: Secondary | ICD-10-CM

## 2021-02-12 DIAGNOSIS — Z7189 Other specified counseling: Secondary | ICD-10-CM

## 2021-02-12 DIAGNOSIS — D72825 Bandemia: Secondary | ICD-10-CM

## 2021-02-12 DIAGNOSIS — Z515 Encounter for palliative care: Secondary | ICD-10-CM

## 2021-02-12 DIAGNOSIS — I82411 Acute embolism and thrombosis of right femoral vein: Secondary | ICD-10-CM

## 2021-02-12 LAB — CBC WITH DIFFERENTIAL/PLATELET
Abs Immature Granulocytes: 0.19 10*3/uL — ABNORMAL HIGH (ref 0.00–0.07)
Basophils Absolute: 0 10*3/uL (ref 0.0–0.1)
Basophils Relative: 0 %
Eosinophils Absolute: 0 10*3/uL (ref 0.0–0.5)
Eosinophils Relative: 0 %
HCT: 29.8 % — ABNORMAL LOW (ref 39.0–52.0)
Hemoglobin: 10 g/dL — ABNORMAL LOW (ref 13.0–17.0)
Immature Granulocytes: 1 %
Lymphocytes Relative: 6 %
Lymphs Abs: 1.1 10*3/uL (ref 0.7–4.0)
MCH: 27.7 pg (ref 26.0–34.0)
MCHC: 33.6 g/dL (ref 30.0–36.0)
MCV: 82.5 fL (ref 80.0–100.0)
Monocytes Absolute: 1.9 10*3/uL — ABNORMAL HIGH (ref 0.1–1.0)
Monocytes Relative: 11 %
Neutro Abs: 14.4 10*3/uL — ABNORMAL HIGH (ref 1.7–7.7)
Neutrophils Relative %: 82 %
Platelets: 226 10*3/uL (ref 150–400)
RBC: 3.61 MIL/uL — ABNORMAL LOW (ref 4.22–5.81)
RDW: 15.8 % — ABNORMAL HIGH (ref 11.5–15.5)
WBC: 17.6 10*3/uL — ABNORMAL HIGH (ref 4.0–10.5)
nRBC: 0.1 % (ref 0.0–0.2)

## 2021-02-12 LAB — URINALYSIS, ROUTINE W REFLEX MICROSCOPIC
Glucose, UA: NEGATIVE mg/dL
Hgb urine dipstick: NEGATIVE
Ketones, ur: NEGATIVE mg/dL
Leukocytes,Ua: NEGATIVE
Nitrite: NEGATIVE
Protein, ur: 30 mg/dL — AB
Specific Gravity, Urine: 1.016 (ref 1.005–1.030)
pH: 5 (ref 5.0–8.0)

## 2021-02-12 LAB — PROTIME-INR
INR: 2.3 — ABNORMAL HIGH (ref 0.8–1.2)
Prothrombin Time: 25.2 seconds — ABNORMAL HIGH (ref 11.4–15.2)

## 2021-02-12 LAB — COMPREHENSIVE METABOLIC PANEL
ALT: 512 U/L — ABNORMAL HIGH (ref 0–44)
AST: 1203 U/L — ABNORMAL HIGH (ref 15–41)
Albumin: 2.1 g/dL — ABNORMAL LOW (ref 3.5–5.0)
Alkaline Phosphatase: 718 U/L — ABNORMAL HIGH (ref 38–126)
Anion gap: 12 (ref 5–15)
BUN: 43 mg/dL — ABNORMAL HIGH (ref 8–23)
CO2: 22 mmol/L (ref 22–32)
Calcium: 8.3 mg/dL — ABNORMAL LOW (ref 8.9–10.3)
Chloride: 89 mmol/L — ABNORMAL LOW (ref 98–111)
Creatinine, Ser: 2.84 mg/dL — ABNORMAL HIGH (ref 0.61–1.24)
GFR, Estimated: 23 mL/min — ABNORMAL LOW (ref >60)
Glucose, Bld: 115 mg/dL — ABNORMAL HIGH (ref 70–99)
Potassium: 5.2 mmol/L — ABNORMAL HIGH (ref 3.5–5.1)
Sodium: 123 mmol/L — ABNORMAL LOW (ref 135–145)
Total Bilirubin: 10.6 mg/dL — ABNORMAL HIGH (ref 0.3–1.2)
Total Protein: 5.8 g/dL — ABNORMAL LOW (ref 6.5–8.1)

## 2021-02-12 LAB — CANCER ANTIGEN 19-9: CA 19-9: 67530 U/mL — ABNORMAL HIGH (ref 0–35)

## 2021-02-12 LAB — MAGNESIUM: Magnesium: 2.3 mg/dL (ref 1.7–2.4)

## 2021-02-12 LAB — CORTISOL-AM, BLOOD: Cortisol - AM: 18.1 ug/dL (ref 6.7–22.6)

## 2021-02-12 LAB — PROCALCITONIN: Procalcitonin: 8.42 ng/mL

## 2021-02-12 LAB — LACTIC ACID, PLASMA: Lactic Acid, Venous: 1.7 mmol/L (ref 0.5–1.9)

## 2021-02-12 MED ORDER — NON FORMULARY: 0.7500 mg | Freq: Two times a day (BID) | Status: DC

## 2021-02-12 MED ORDER — DIPHENHYDRAMINE HCL 25 MG PO CAPS: 50.0000 mg | ORAL_CAPSULE | Freq: Three times a day (TID) | ORAL | Status: DC | PRN

## 2021-02-12 MED ORDER — HYDROMORPHONE HCL 2 MG PO TABS
1.0000 mg | ORAL_TABLET | ORAL | Status: DC | PRN
  Administered 2021-02-13: 2 mg via ORAL
  Filled 2021-02-12: qty 1

## 2021-02-12 MED ORDER — EVEROLIMUS 0.75 MG PO TABS
0.7500 mg | ORAL_TABLET | Freq: Two times a day (BID) | ORAL | Status: DC
  Administered 2021-02-12 – 2021-02-13 (×2): 0.75 mg via ORAL

## 2021-02-12 MED ORDER — LORAZEPAM 2 MG/ML IJ SOLN: 0.5000 mg | INTRAMUSCULAR | Status: DC | PRN

## 2021-02-12 MED ORDER — NON FORMULARY: 720.0000 mg | Freq: Two times a day (BID) | Status: DC

## 2021-02-12 MED ORDER — MYCOPHENOLATE SODIUM 180 MG PO TBEC
720.0000 mg | DELAYED_RELEASE_TABLET | Freq: Two times a day (BID) | ORAL | Status: DC
  Administered 2021-02-12 – 2021-02-13 (×2): 720 mg via ORAL
  Filled 2021-02-12 (×2): qty 4

## 2021-02-12 MED ORDER — HYDROMORPHONE HCL 1 MG/ML IJ SOLN
1.0000 mg | INTRAMUSCULAR | Status: DC | PRN
  Administered 2021-02-12: 1 mg via INTRAVENOUS
  Filled 2021-02-12: qty 1

## 2021-02-12 MED ORDER — EVEROLIMUS 0.75 MG PO TABS
0.7500 mg | ORAL_TABLET | Freq: Two times a day (BID) | ORAL | Status: DC
  Administered 2021-02-12: 0.75 mg via ORAL

## 2021-02-12 MED ORDER — HYDROMORPHONE HCL 1 MG/ML IJ SOLN
0.5000 mg | INTRAMUSCULAR | Status: DC | PRN
  Administered 2021-02-12 – 2021-02-13 (×4): 0.5 mg via INTRAVENOUS
  Filled 2021-02-12 (×4): qty 0.5

## 2021-02-12 MED ORDER — MYCOPHENOLATE SODIUM 180 MG PO TBEC
720.0000 mg | DELAYED_RELEASE_TABLET | Freq: Two times a day (BID) | ORAL | Status: DC
  Administered 2021-02-12: 720 mg via ORAL

## 2021-02-12 MED ORDER — CHLORHEXIDINE GLUCONATE CLOTH 2 % EX PADS
6.0000 | MEDICATED_PAD | Freq: Every day | CUTANEOUS | Status: DC
  Administered 2021-02-13: 6 via TOPICAL

## 2021-02-12 MED ORDER — ORAL CARE MOUTH RINSE
15.0000 mL | Freq: Two times a day (BID) | OROMUCOSAL | Status: DC
  Administered 2021-02-12 – 2021-02-13 (×2): 15 mL via OROMUCOSAL

## 2021-02-12 MED ORDER — HYDROMORPHONE HCL 1 MG/ML IJ SOLN: 1.0000 mg | INTRAMUSCULAR | Status: DC | PRN

## 2021-02-12 NOTE — TOC Progression Note (Signed)
Transition of Care [facility]) - Progression Note    Patient Details  Name: Brandon [name] MRN: [id number] Date of Birth: [birth year]  Transition of Care [facility]) CM/SW Contact  Leeroy Cha, RN Phone Number: 02/12/2021, 2:09 PM  Clinical Narrative:    tct-Mary Lelon Frohlich with [employer]/will see patient and contact the spouse.   Expected Discharge Plan: Home/Self Care Barriers to Discharge: Continued Medical Work up  Expected Discharge Plan and Services Expected Discharge Plan: Home/Self Care   Discharge Planning Services: CM Consult   Living arrangements for the past 2 months: Single Family Home                                       Social Determinants of Health (SDOH) Interventions    Readmission Risk Interventions No flowsheet data found.

## 2021-02-12 NOTE — Progress Notes (Signed)
Attempted to call report x 1  

## 2021-02-12 NOTE — Progress Notes (Addendum)
HEMATOLOGY-ONCOLOGY PROGRESS NOTE  SUBJECTIVE: Brandon [name] is followed by our practice for metastatic pancreatic cancer.  He was seen in our office yesterday and developed a fever and tachypnea in our infusion area.  He was sent to the emergency room and is now admitted for possible sepsis, AKI, and liver dysfunction.  I saw the patient this morning and just prior to my visit, the patient opted to pursue comfort measures and hospice.  He had multiple family members at the bedside.  He reports that he had a headache earlier today which has now resolved.  Abdominal pain better.  Denies nausea and vomiting.  Oncology History  Pancreatic cancer metastasized to liver [facility])  01/17/2021 Imaging   Liver US  1.  Limited evaluation of the liver parenchyma. Multiple hepatic masses are  visualized. Additionally, the IVC demonstrates internal echoes concerning  for thrombus/mass. Recommend [place] protocol CT.  2.  Blood flow in the normal direction in the major hepatic blood vessels    01/17/2021 Imaging   CT A/P  Impression:  1.  Filling defect of a right lower lobe pulmonary vessel, incompletely visualized but likely representing pulmonary embolism. Adjacent small right pleural effusion. Recommend dedicated CT chest PE for further evaluation.  2.  Innumerable new hepatic masses concerning for metastatic disease. Biopsy recommended.  3.  Pancreatic tail lesion, which may represent primary pancreatic primary neoplasm versus metastasis.  4.  Paraesophageal lymphadenopathy concerning for nodal metastasis.    01/31/2021 Pathology Results   Liver Lesion FNA  Adenocarcinoma, see comment.   Comment: In the context of the patient's pancreatic mass, the morphology and immunophenotype are most consistent with a pancreaticobiliary primary.  The following immunohistochemistry was performed after review of the clinical history and morphology to further characterize the pathologic process. The results are as  follows:   A1-14    CK7                   Positive  A1-15    CK20                 Positive/weak     A1-16    CK19                 Positive  A1-17    CDX-2                Negative  A1-18    HEP PAR1         Negative    02/07/2021 Initial Diagnosis   Pancreatic cancer metastasized to liver ([place])   02/13/2021 -  Chemotherapy    Patient is on Treatment Plan: PANCREATIC ABRAXANE / GEMCITABINE D1,8,15 Q28D         REVIEW OF SYSTEMS:   Constitutional: Denies fevers, chills  Eyes: Denies blurriness of vision Ears, nose, mouth, throat, and face: Denies mucositis or sore throat Respiratory: Denies cough, dyspnea or wheezes Cardiovascular: Denies palpitation, chest discomfort Gastrointestinal:  Denies nausea, heartburn or change in bowel habits Skin: Denies abnormal skin rashes Lymphatics: Denies new lymphadenopathy or easy bruising Neurological:Denies numbness, tingling or new weaknesses Behavioral/Psych: Mood is stable, no new changes  Extremities: No lower extremity edema All other systems were reviewed with the patient and are negative.  I have reviewed the past medical history, past surgical history, social history and family history with the patient and they are unchanged from previous note.   PHYSICAL EXAMINATION: ECOG PERFORMANCE STATUS: 3 - Symptomatic, >50% confined to bed  Vitals:   02/12/21  0700 02/12/21 0800  BP: 91/69   Pulse: (!) 129   Resp: (!) 23   Temp:  (!) 97.4 F (36.3 C)  SpO2: 94%    Filed Weights   02/11/21 1622 02/12/21 0500  Weight: 68 kg 68.4 kg    Intake/Output from previous day: 06/06 0701 - 06/07 0700 In: 4204 [I.V.:1699.3; IV Piggyback:2504.8] Out: 280 [Urine:280]  GENERAL: Awake and alert, resting comfortably SKIN: skin color, texture, turgor are normal, no rashes or significant lesions EYES: Scleral icterus present OROPHARYNX: No thrush or mucositis LUNGS: clear to auscultation and percussion with normal breathing effort HEART: regular  rate & rhythm and no murmurs and 2+ bilateral lower extremity edema ABDOMEN:abdomen soft, non-tender and normal bowel sounds  NEURO: alert & oriented x 3 with fluent speech, no focal motor/sensory deficits  LABORATORY DATA:  I have reviewed the data as listed CMP Latest Ref Rng & Units 02/12/2021 02/11/2021 02/11/2021  Glucose 70 - 99 mg/dL 115(H) 120(H) 100(H)  BUN 8 - 23 mg/dL 43(H) 42(H) 40(H)  Creatinine 0.61 - 1.24 mg/dL 2.84(H) 2.73(H) 2.91(H)  Sodium 135 - 145 mmol/L 123(L) 124(L) 127(L)  Potassium 3.5 - 5.1 mmol/L 5.2(H) 5.5(H) 5.7(H)  Chloride 98 - 111 mmol/L 89(L) 91(L) 90(L)  CO2 22 - 32 mmol/L 22 22 22   Calcium 8.9 - 10.3 mg/dL 8.3(L) 8.5(L) 9.7  Total Protein 6.5 - 8.1 g/dL 5.8(L) 6.1(L) 6.5  Total Bilirubin 0.3 - 1.2 mg/dL 10.6(H) 11.4(H) 12.7(HH)  Alkaline Phos 38 - 126 U/L 718(H) 790(H) 1,054(H)  AST 15 - 41 U/L 1,203(H) 1,335(H) 1,405(HH)  ALT 0 - 44 U/L 512(H) 521(H) 580(HH)    Lab Results  Component Value Date   WBC 17.6 (H) 02/12/2021   HGB 10.0 (L) 02/12/2021   HCT 29.8 (L) 02/12/2021   MCV 82.5 02/12/2021   PLT 226 02/12/2021   NEUTROABS 14.4 (H) 02/12/2021    CT ABDOMEN PELVIS WO CONTRAST  Result Date: 02/11/2021 CLINICAL DATA:  Abdominal pain and fever. Pancreatic cancer. History of liver transplant. EXAM: CT ABDOMEN AND PELVIS WITHOUT CONTRAST TECHNIQUE: Multidetector CT imaging of the abdomen and pelvis was performed following the standard protocol without IV contrast. COMPARISON:  CT 01/17/2021 at an outside institution. FINDINGS: Lower chest: Cardiomegaly with calcification of native coronary arteries. Median sternotomy. Right middle lobe nodule is calcified and consistent with prior granulomatous disease. Bandlike atelectasis within both lower lobes. Hepatobiliary: Heterogeneous liver with multiple ill-defined low-density liver lesions, better appreciated on prior contrast-enhanced exam. These appear more prominent and confluent within the left hepatic lobe.  Surgical clips at the liver hilum. Direct comparison is difficult due to lack of IV contrast on the current exam. Pancreas: Hypodense pancreatic tail mass measures approximately 3.5 cm, similar to prior exam. No peripancreatic fat stranding or ductal dilatation. Spleen: Splenomegaly with spleen spanning 17.2 cm in AP dimension. Previous splenic infarct is faintly visualized in the superior aspect, grossly unchanged. Adrenals/Urinary Tract: No adrenal nodule. Mild bilateral renal parenchymal thinning and perinephric edema. No hydronephrosis. No renal calculi. No evidence of focal renal lesion. Physiologically distended urinary bladder with equivocal wall thickening. Stomach/Bowel: Bowel evaluation is limited in the absence of contrast. Decompressed stomach. Occasional fluid-filled loops of small bowel without abnormal distension or obstruction. No obvious small bowel wall thickening or inflammation. Air-filled appendix without appendicitis. There is submucosal fatty infiltration involving the ascending and transverse colon. Richter's type hernia involving the upper ventral abdominal wall containing anti mesenteric border of transverse colon. There is mild wall thickening of the  adjacent colonic segment but no pericolonic inflammation. Liquid and solid stool in the descending and sigmoid colon. Occasional colonic diverticulosis without diverticulitis. Sigmoid colon is redundant. Vascular/Lymphatic: Aorto bi-iliac atherosclerosis. There is a 12 mm portal caval node, series 2, image 39, not significantly changed from prior. Multiple small retroperitoneal lymph nodes are not enlarged by size criteria. Reproductive: Mildly enlarged prostate gland spanning 5.3 cm transverse. Other: Small volume perihepatic ascites is new. Small volume of free fluid within both pericolic gutters and tracking into the pelvis. Serpiginous densities in the anterior upper omental fat correspond to vessels on prior exam. Supraumbilical ventral  abdominal wall hernia now contains anti mesenteric border of transverse colon. There is an additional fat containing ventral abdominal wall hernia below the hernia containing transverse colon. This contains mild edema and trace fluid. Bilateral fat containing inguinal hernias. No free intra-abdominal air. Musculoskeletal: Bones are diffusely under mineralized. Sclerotic densities in the right acetabulum are again seen. No acute osseous abnormalities are seen. IMPRESSION: 1. Richter's type hernia involving the upper ventral abdominal wall containing anti mesenteric border of transverse colon. There is mild wall thickening of the adjacent transverse colon but no significant inflammation or evidence of obstruction. There is an additional fat containing ventral abdominal wall hernia below the hernia containing transverse colon. This contains mild edema and trace fluid. 2. Pancreatic mass with hepatic metastasis. Multiple ill-defined low-density liver lesions, better appreciated on prior contrast-enhanced exam. These appear more prominent and confluent within the left hepatic lobe. 3. Small volume of ascites in the abdomen and pelvis is new. 4. Submucosal fatty infiltration involving the ascending and transverse colon, can be seen with chronic inflammation. 5. Splenomegaly. Splenic infarct is grossly stable from prior, not well-defined on the current exam in the absence of IV contrast. 6. Mild chronic bladder wall thickening. Mildly enlarged prostate gland. Aortic Atherosclerosis (ICD10-I70.0). Electronically Signed   By: Keith Rake M.D.   On: 02/11/2021 17:20   DG Chest Portable 1 View  Result Date: 02/11/2021 CLINICAL DATA:  Shortness of breath, fever EXAM: PORTABLE CHEST 1 VIEW COMPARISON:  Partial comparison to CT abdomen/pelvis dated 02/11/2021. Outside hospital CT chest dated 01/17/2021 FINDINGS: Mild bibasilar scarring/atelectasis, better evaluated on recent CT. No focal consolidation. No pleural effusion  or pneumothorax. Cardiomegaly.  Postsurgical changes related to prior CABG. Status post ORIF of the left clavicle.  Median sternotomy. IMPRESSION: Mild bibasilar scarring/atelectasis. No evidence of acute cardiopulmonary disease. Electronically Signed   By: Julian Hy M.D.   On: 02/11/2021 17:48   VAS [name] LOWER EXTREMITY VENOUS (DVT)  Result Date: 01/14/2021  Lower Venous DVT Study Patient Name:  Brandon [name]  Date of Exam:   01/14/2021 Medical Rec #: [id number]       Accession #:    [id number] Date of Birth: September 25, 1950       Patient Gender: M Patient Age:   70Y Exam Location:  [facility] Procedure:      VAS [name] LOWER EXTREMITY VENOUS (DVT) Referring Phys: [id number] CALEB M PARKER --------------------------------------------------------------------------------  Indications: Pain, Swelling, and Edema.  Risk Factors: Surgery History of CABG. Performing Technologist: Delorise Shiner RVT  Examination Guidelines: A complete evaluation includes B-mode imaging, spectral Doppler, color Doppler, and power Doppler as needed of all accessible portions of each vessel. Bilateral testing is considered an integral part of a complete examination. Limited examinations for reoccurring indications may be performed as noted. The reflux portion of the exam is performed with the patient in reverse Trendelenburg.  +---------+---------------+---------+-----------+-----------------+------------+ RIGHT  CompressibilityPhasicitySpontaneityProperties       Thrombus                                                                  Aging        +---------+---------------+---------+-----------+-----------------+------------+ CFV      Full           Yes      Yes                                      +---------+---------------+---------+-----------+-----------------+------------+ SFJ      Full           Yes      Yes                                       +---------+---------------+---------+-----------+-----------------+------------+ FV Prox  Full           Yes      Yes                                      +---------+---------------+---------+-----------+-----------------+------------+ FV Mid   Full           Yes      Yes                                      +---------+---------------+---------+-----------+-----------------+------------+ FV DistalPartial        Yes      No                                       +---------+---------------+---------+-----------+-----------------+------------+ PFV      Full           Yes                                               +---------+---------------+---------+-----------+-----------------+------------+ POP      None           No       No         dilated          Acute        +---------+---------------+---------+-----------+-----------------+------------+ PTV      Full                                                             +---------+---------------+---------+-----------+-----------------+------------+ Soleal   None           No       No         dilated  Acute        +---------+---------------+---------+-----------+-----------------+------------+ Gastroc  None           No       No         dilated          Acute        +---------+---------------+---------+-----------+-----------------+------------+ GSV      Partial                 No         spongy           Acute                                                    w/compression                 +---------+---------------+---------+-----------+-----------------+------------+ SSV      Partial                            softly echogenic Acute        +---------+---------------+---------+-----------+-----------------+------------+   +----+---------------+---------+-----------+----------+--------------+ LEFTCompressibilityPhasicitySpontaneityPropertiesThrombus Aging  +----+---------------+---------+-----------+----------+--------------+ CFV Full           Yes                                          +----+---------------+---------+-----------+----------+--------------+    Findings reported to [facility] at 16:30.  Summary: RIGHT: - Findings consistent with acute deep vein thrombosis involving the right femoral vein, right popliteal vein, right soleal veins, right gastrocnemius veins, and right intramuscular calf vein. - Findings consistent with acute superficial vein thrombosis involving the right great saphenous vein, and right small saphenous vein.  LEFT: - No evidence of common femoral vein obstruction. - Preliminary report was called to [facility] who advised patient to take Xarelto.  *See table(s) above for measurements and observations. Electronically signed by Harold Barban MD on 01/14/2021 at 6:11:30 PM.    Final     ASSESSMENT AND PLAN: 1.  Metastatic pancreatic cancer 2.  Liver dysfunction 3.  AKI 4.  Leukocytosis, improved 5.  ?  Sepsis 6.  Acute DVT of the right calf, PE 7.  History of liver transplant 2017 8.  Goals of care discussion  -The patient has metastatic pancreatic cancer which is an incurable malignancy.  He has also had rapid worsening of liver function and performance status.  We have previously discussed palliative chemotherapy with the patient, however, due to worsening liver function and performance status, transitioning to comfort measures and hospice is appropriate. -The patient has opted to pursue hospice and comfort measures.  Of care team has been involved and is in the process of arranging referral to hospice.  Agree with DNR/DNI. -The patient and his family would like to continue to take his antirejection medications for now.  I have ordered these and they will bring them from home to give to our pharmacy to dispense.  I discussed with the patient and his family that hospice will likely not pay for these  medications.  They are aware of it and feel that they have a fair amount at home for the foreseeable future.   LOS: 1 day   Erasmo Downer  Curcio, DNP, AGPCNP-BC, AOCNP 02/12/21   Addendum  I have seen the patient, examined him. I agree with the assessment and and plan and have edited the notes.   I have reviewed his labs and CT scan results, and discussed with him.  His rapid liver failure is likely related to metastatic cancer in liver and history of liver transplant.  He is comfortable now, and has decided to go home with hospice tomorrow.  I fully support his decision, we reviewed symptom management.  I will add oral hydromorphone 1 to 2 mg every 2 hours as needed day, and let him try before he goes home tomorrow.  I will be happy to remain to be his attending when he is under hospice care.  Truitt Merle  02/12/2021

## 2021-02-12 NOTE — Telephone Encounter (Signed)
Cancelled upcoming appointments scheduled per 6/2 los due to patient being hospitalized and possibly going into hospice care per 6/7 secure chat.

## 2021-02-12 NOTE — Telephone Encounter (Signed)
Katrina form Hospice call will like to know if Dr Jerline Pain can be patient attending care. Patient been discharge tomorrow morning form [facility] home.  Please advice

## 2021-02-12 NOTE — Progress Notes (Addendum)
Progress Note    [place] Monds  QMG:[id number] DOB: 1951/03/09  DOA: 02/11/2021 PCP: [name], MD      Brief Narrative:    Medical records reviewed and are as summarized below:  Brandon [name] is a 70 y.o. 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  He presented to Dr. Ernestina Penna office (oncologist) because of abdominal pain, fatigue, lack of energy, increasing somnolence and jaundice.  He also complained of decreased urine output and increasing swelling of bilateral legs.  He was found to have a fever and tachypnea at the oncology office/cancer centers so he was referred to the emergency room for further management.  He was admitted to the hospital for SIRS/ ?  Sepsis, AKI complicated by hyperkalemia, hyponatremia and elevated liver enzymes.  He was treated with analgesics, empiric IV antibiotics and IV fluids.  He requested comfort measures requested that all his medicines be discontinued.  He was transitioned to comfort care and hospice team was consulted.    Assessment/Plan:   Principal Problem:   Elevated LFTs Active Problems:   Liver transplant recipient [facility])   Ventral hernia without obstruction or gangrene   DVT (deep venous thrombosis) (HCC)   Pancreatic cancer metastasized to liver (HCC)   Leukocytosis   Hyponatremia   AKI (acute kidney injury) ([facility])    Body mass index is 21.03 kg/m.    SIRS vs sepsis Stage IV pancreatic cancer with metastasis to the liver AKI complicated by hyperkalemia Hyponatremia Elevated liver enzymes/hyperbilirubinemia/?  Liver failure Leukocytosis Recent diagnosis of acute DVT in the right lower extremity (on  01/14/2021) and PE (on 12/02/7122) Chronic diastolic CHF CAD s/p CABG History of liver cirrhosis s/p liver transplant in 2017 Hypertension Immunocompromised state   PLAN  Change IV morphine to IV Dilaudid for adequate pain control. Discontinue empiric IV antibiotics Discontinue IV fluids Discontinue Xarelto Discontinue mofetil and everolimus Plan of care was discussed with the patient and his wife at the bedside (his daughter and pastor joined [name] later and he wanted his pastor to be present during our discussion).  Discussed diagnoses and prognosis.  Goals of care were discussed as well.  Patient has elected to proceed with comfort measures.  He wanted all his medicines to be discontinued except medicines for comfort. Hospice team has been consulted to assist with transition of care. Plan was also discussed with his nurse, Vonna Kotyk, who was also present at the bedside.   Diet Order            Diet regular Room service appropriate? Yes; Fluid consistency: Thin  Diet effective now                    Consultants:  Palliative care and hospice team  Procedures:  None    Medications:   . Chlorhexidine Gluconate Cloth  6 each Topical Daily  . mouth rinse  15 mL Mouth Rinse BID  . tamsulosin  0.4 mg Oral QPC supper   Continuous Infusions:    Anti-infectives (From admission, onward)   Start     Dose/Rate Route Frequency Ordered Stop   02/12/21 0200  metroNIDAZOLE (FLAGYL) IVPB 500 mg  Status:  Discontinued        500 mg 100 mL/hr over  60 Minutes Intravenous Every 8 hours 02/11/21 1810 02/12/21 0907   02/11/21 1845  linezolid (ZYVOX) IVPB 600 mg  Status:  Discontinued        600 mg 300 mL/hr over 60 Minutes Intravenous  Once 02/11/21 1830 02/11/21 1841   02/11/21 1845  ceFEPIme (MAXIPIME) 2 g in sodium chloride 0.9 % 100 mL IVPB  Status:  Discontinued        2 g 200 mL/hr over 30 Minutes Intravenous Daily-1800 02/11/21 1833 02/12/21 0907   02/11/21 1845  linezolid  (ZYVOX) IVPB 600 mg  Status:  Discontinued        600 mg 300 mL/hr over 60 Minutes Intravenous 2 times daily 02/11/21 1841 02/12/21 0907   02/11/21 1800  metroNIDAZOLE (FLAGYL) IVPB 500 mg        500 mg 100 mL/hr over 60 Minutes Intravenous  Once 02/11/21 1755 02/11/21 2034             Family Communication/Anticipated D/C date and plan/Code Status   DVT prophylaxis:      Code Status: DNR  Family Communication: His wife at the bedside Disposition Plan:    Status is: Inpatient  Remains inpatient appropriate because:Inpatient level of care appropriate due to severity of illness   Dispo: The patient is from: Home              Anticipated d/c is to: Home              Patient currently is not medically stable to d/c.   Difficult to place patient No           Subjective:   C/o abdominal pain  Objective:    Vitals:   02/12/21 0600 02/12/21 0700 02/12/21 0800 02/12/21 0900  BP: (!) 149/94 91/69 119/75 105/75  Pulse: (!) 141 (!) 129 (!) 136 (!) 132  Resp: (!) 25 (!) 23 (!) 24 (!) 23  Temp:   (!) 97.4 F (36.3 C)   TempSrc:   Oral   SpO2: 91% 94% 95% 96%  Weight:      Height:       No data found.   Intake/Output Summary (Last 24 hours) at 02/12/2021 1440 Last data filed at 02/12/2021 0900 Gross per 24 hour  Intake 4640.43 ml  Output 280 ml  Net 4360.43 ml   Filed Weights   02/11/21 1622 02/12/21 0500  Weight: 68 kg 68.4 kg    Exam:  GEN: NAD SKIN: Warm and dry.  Jaundiced EYES: Icteric ENT: MMM CV: Tachycardic, no murmurs heard PULM: CTA B ABD: soft, abdominal hernia, distended, diffuse tenderness but no rebound tenderness or guarding, +BS CNS: AAO x 3, non focal EXT: Significant B/l leg edema, no tenderness or erythema        Data Reviewed:   I have personally reviewed following labs and imaging studies:  Labs: Labs show the following:   Basic Metabolic Panel: Recent Labs  Lab 02/11/21 1301 02/11/21 1623 02/12/21 0030  NA  127* 124* 123*  K 5.7* 5.5* 5.2*  CL 90* 91* 89*  CO2 22 22 22   GLUCOSE 100* 120* 115*  BUN 40* 42* 43*  CREATININE 2.91* 2.73* 2.84*  CALCIUM 9.7 8.5* 8.3*  MG  --   --  2.3   GFR Estimated Creatinine Clearance: 23.8 mL/min (A) (by C-G formula based on SCr of 2.84 mg/dL (H)). Liver Function Tests: Recent Labs  Lab 02/11/21 1301 02/11/21 1623 02/12/21 0030  AST 1,405* 1,335* 1,203*  ALT 580*  521* 512*  ALKPHOS 1,054* 790* 718*  BILITOT 12.7* 11.4* 10.6*  PROT 6.5 6.1* 5.8*  ALBUMIN 2.1* 2.1* 2.1*   Recent Labs  Lab 02/11/21 1440 02/11/21 1623  LIPASE 21 20   No results for input(s): AMMONIA in the last 168 hours. Coagulation profile Recent Labs  Lab 02/11/21 1816 02/12/21 0030  INR 2.6* 2.3*    CBC: Recent Labs  Lab 02/11/21 1301 02/11/21 1623 02/12/21 0030  WBC 24.0* 20.7* 17.6*  NEUTROABS 19.5* 18.0* 14.4*  HGB 11.3* 10.3* 10.0*  HCT 32.1* 30.5* 29.8*  MCV 80.3 81.6 82.5  PLT 303 256 226   Cardiac Enzymes: No results for input(s): CKTOTAL, CKMB, CKMBINDEX, TROPONINI in the last 168 hours. BNP (last 3 results) No results for input(s): PROBNP in the last 8760 hours. CBG: No results for input(s): GLUCAP in the last 168 hours. D-Dimer: No results for input(s): DDIMER in the last 72 hours. Hgb A1c: No results for input(s): HGBA1C in the last 72 hours. Lipid Profile: No results for input(s): CHOL, HDL, LDLCALC, TRIG, CHOLHDL, LDLDIRECT in the last 72 hours. Thyroid function studies: No results for input(s): TSH, T4TOTAL, T3FREE, THYROIDAB in the last 72 hours.  Invalid input(s): FREET3 Anemia work up: No results for input(s): VITAMINB12, FOLATE, FERRITIN, TIBC, IRON, RETICCTPCT in the last 72 hours. Sepsis Labs: Recent Labs  Lab 02/11/21 1301 02/11/21 1439 02/11/21 1623 02/11/21 1816 02/12/21 0030  PROCALCITON  --   --   --   --  8.42  WBC 24.0*  --  20.7*  --  17.6*  LATICACIDVEN  --  3.7*  --  1.6 1.7    Microbiology Recent Results  (from the past 240 hour(s))  Culture, Blood     Status: None (Preliminary result)   Collection Time: 02/11/21  2:37 PM   Specimen: BLOOD LEFT ARM  Result Value Ref Range Status   Specimen Description BLOOD LEFT ARM  Final   Special Requests   Final    BOTTLES DRAWN AEROBIC AND ANAEROBIC Blood Culture adequate volume   Culture   Final    NO GROWTH < 12 HOURS Performed at [facility], [place] [address], [place], [place] [address]    Report Status PENDING  Incomplete  Culture, Blood     Status: None (Preliminary result)   Collection Time: 02/11/21  2:44 PM   Specimen: BLOOD RIGHT ARM  Result Value Ref Range Status   Specimen Description BLOOD RIGHT ARM  Final   Special Requests   Final    BOTTLES DRAWN AEROBIC AND ANAEROBIC Blood Culture adequate volume   Culture   Final    NO GROWTH < 12 HOURS Performed at [facility], [place] [address], [place], [place] [address]    Report Status PENDING  Incomplete  Resp Panel by RT-PCR (Flu A&B, Covid) Nasopharyngeal Swab     Status: None   Collection Time: 02/11/21  4:23 PM   Specimen: Nasopharyngeal Swab; Nasopharyngeal(NP) swabs in vial transport medium  Result Value Ref Range Status   SARS Coronavirus 2 by RT PCR NEGATIVE NEGATIVE Final    Comment: (NOTE) SARS-CoV-2 target nucleic acids are NOT DETECTED.  The SARS-CoV-2 RNA is generally detectable in upper respiratory specimens during the acute phase of infection. The lowest concentration of SARS-CoV-2 viral copies this assay can detect is 138 copies/mL. A negative result does not preclude SARS-Cov-2 infection and should not be used as the sole basis for treatment or other patient management decisions. A negative  result may occur with  improper specimen collection/handling, submission of specimen other than nasopharyngeal swab, presence of viral mutation(s) within the areas targeted by this assay, and inadequate number of viral copies(<138 copies/mL). A negative result must be  combined with clinical observations, patient history, and epidemiological information. The expected result is Negative.  Fact Sheet for Patients:  EntrepreneurPulse.com.au  Fact Sheet for Healthcare Providers:  IncredibleEmployment.be  This test is no t yet approved or cleared by the [place] FDA and  has been authorized for detection and/or diagnosis of SARS-CoV-2 by FDA under an Emergency Use Authorization (EUA). This EUA will remain  in effect (meaning this test can be used) for the duration of the COVID-19 declaration under Section 564(b)(1) of the Act, 21 U.S.C.section 360bbb-3(b)(1), unless the authorization is terminated  or revoked sooner.       Influenza A by PCR NEGATIVE NEGATIVE Final   Influenza B by PCR NEGATIVE NEGATIVE Final    Comment: (NOTE) The Xpert Xpress SARS-CoV-2/FLU/RSV plus assay is intended as an aid in the diagnosis of influenza from Nasopharyngeal swab specimens and should not be used as a sole basis for treatment. Nasal washings and aspirates are unacceptable for Xpert Xpress SARS-CoV-2/FLU/RSV testing.  Fact Sheet for Patients: EntrepreneurPulse.com.au  Fact Sheet for Healthcare Providers: IncredibleEmployment.be  This test is not yet approved or cleared by the [place] FDA and has been authorized for detection and/or diagnosis of SARS-CoV-2 by FDA under an Emergency Use Authorization (EUA). This EUA will remain in effect (meaning this test can be used) for the duration of the COVID-19 declaration under Section 564(b)(1) of the Act, 21 U.S.C. section 360bbb-3(b)(1), unless the authorization is terminated or revoked.  Performed at [facility], [place] [address] [place], [place] [address]     Procedures and diagnostic studies:  CT ABDOMEN PELVIS WO CONTRAST  Result Date: 02/11/2021 CLINICAL DATA:  Abdominal pain and fever. Pancreatic cancer.  History of liver transplant. EXAM: CT ABDOMEN AND PELVIS WITHOUT CONTRAST TECHNIQUE: Multidetector CT imaging of the abdomen and pelvis was performed following the standard protocol without IV contrast. COMPARISON:  CT 01/17/2021 at an outside institution. FINDINGS: Lower chest: Cardiomegaly with calcification of native coronary arteries. Median sternotomy. Right middle lobe nodule is calcified and consistent with prior granulomatous disease. Bandlike atelectasis within both lower lobes. Hepatobiliary: Heterogeneous liver with multiple ill-defined low-density liver lesions, better appreciated on prior contrast-enhanced exam. These appear more prominent and confluent within the left hepatic lobe. Surgical clips at the liver hilum. Direct comparison is difficult due to lack of IV contrast on the current exam. Pancreas: Hypodense pancreatic tail mass measures approximately 3.5 cm, similar to prior exam. No peripancreatic fat stranding or ductal dilatation. Spleen: Splenomegaly with spleen spanning 17.2 cm in AP dimension. Previous splenic infarct is faintly visualized in the superior aspect, grossly unchanged. Adrenals/Urinary Tract: No adrenal nodule. Mild bilateral renal parenchymal thinning and perinephric edema. No hydronephrosis. No renal calculi. No evidence of focal renal lesion. Physiologically distended urinary bladder with equivocal wall thickening. Stomach/Bowel: Bowel evaluation is limited in the absence of contrast. Decompressed stomach. Occasional fluid-filled loops of small bowel without abnormal distension or obstruction. No obvious small bowel wall thickening or inflammation. Air-filled appendix without appendicitis. There is submucosal fatty infiltration involving the ascending and transverse colon. Richter's type hernia involving the upper ventral abdominal wall containing anti mesenteric border of transverse colon. There is mild wall thickening of the adjacent colonic segment but no pericolonic  inflammation. Liquid and solid stool  in the descending and sigmoid colon. Occasional colonic diverticulosis without diverticulitis. Sigmoid colon is redundant. Vascular/Lymphatic: Aorto bi-iliac atherosclerosis. There is a 12 mm portal caval node, series 2, image 39, not significantly changed from prior. Multiple small retroperitoneal lymph nodes are not enlarged by size criteria. Reproductive: Mildly enlarged prostate gland spanning 5.3 cm transverse. Other: Small volume perihepatic ascites is new. Small volume of free fluid within both pericolic gutters and tracking into the pelvis. Serpiginous densities in the anterior upper omental fat correspond to vessels on prior exam. Supraumbilical ventral abdominal wall hernia now contains anti mesenteric border of transverse colon. There is an additional fat containing ventral abdominal wall hernia below the hernia containing transverse colon. This contains mild edema and trace fluid. Bilateral fat containing inguinal hernias. No free intra-abdominal air. Musculoskeletal: Bones are diffusely under mineralized. Sclerotic densities in the right acetabulum are again seen. No acute osseous abnormalities are seen. IMPRESSION: 1. Richter's type hernia involving the upper ventral abdominal wall containing anti mesenteric border of transverse colon. There is mild wall thickening of the adjacent transverse colon but no significant inflammation or evidence of obstruction. There is an additional fat containing ventral abdominal wall hernia below the hernia containing transverse colon. This contains mild edema and trace fluid. 2. Pancreatic mass with hepatic metastasis. Multiple ill-defined low-density liver lesions, better appreciated on prior contrast-enhanced exam. These appear more prominent and confluent within the left hepatic lobe. 3. Small volume of ascites in the abdomen and pelvis is new. 4. Submucosal fatty infiltration involving the ascending and transverse colon, can be  seen with chronic inflammation. 5. Splenomegaly. Splenic infarct is grossly stable from prior, not well-defined on the current exam in the absence of IV contrast. 6. Mild chronic bladder wall thickening. Mildly enlarged prostate gland. Aortic Atherosclerosis (ICD10-I70.0). Electronically Signed   By: Keith Rake M.D.   On: 02/11/2021 17:20   DG Chest Portable 1 View  Result Date: 02/11/2021 CLINICAL DATA:  Shortness of breath, fever EXAM: PORTABLE CHEST 1 VIEW COMPARISON:  Partial comparison to CT abdomen/pelvis dated 02/11/2021. Outside hospital CT chest dated 01/17/2021 FINDINGS: Mild bibasilar scarring/atelectasis, better evaluated on recent CT. No focal consolidation. No pleural effusion or pneumothorax. Cardiomegaly.  Postsurgical changes related to prior CABG. Status post ORIF of the left clavicle.  Median sternotomy. IMPRESSION: Mild bibasilar scarring/atelectasis. No evidence of acute cardiopulmonary disease. Electronically Signed   By: Julian Hy M.D.   On: 02/11/2021 17:48               LOS: 1 day   Timaya Bojarski  [facility]   Pager on [email or web address]. If 7PM-7AM, please contact night-coverage at [email or web address]     02/12/2021, 2:40 PM

## 2021-02-12 NOTE — Consult Note (Signed)
Consultation Note Date: 02/12/2021   Patient Name: Brandon [name]  DOB: 25-Apr-1951  MRN: [id number]  Age / Sex: 70 y.o., male  PCP: [name], MD Referring Physician: Jennye Boroughs, MD  Reason for Consultation: Establishing goals of care  HPI/Patient Profile: 70 y.o. male   admitted on 02/11/2021    Clinical Assessment and Goals of Care: 70 year old gentleman, resident of [place], [place] where he lives with his wife.  Patient has been followed with medical oncology for metastatic pancreatic cancer.  Admitted to hospital medicine service for possible sepsis acute kidney injury and liver dysfunction.  Recent imaging shows more prominence of hepatic metastases as well as known pancreatic mass.  Ongoing goals of care conversations and more focused directed towards establishing DNR as well as considering hospice hence a palliative consultation in house has been requested for further discussions.  Patient is awake alert resting in bed.  His family is present at the bedside.  I introduced myself and palliative care as follows: Palliative medicine is specialized medical care for people living with serious illness. It focuses on providing relief from the symptoms and stress of a serious illness. The goal is to improve quality of life for both the patient and the family.  Goals of care: Broad aims of medical therapy in relation to the patient's values and preferences. Our aim is to provide medical care aimed at enabling patients to achieve the goals that matter most to them, given the circumstances of their particular medical situation and their constraints.   Goals wishes and values important to the patient and family as a unit attempted to be explored.  Patient states that he is still trying to wrap his head around to how seriously ill he is and that he faces a very short-term prognosis.  Offered active  listening compassionate presence and supportive care.  Discussed with the patient's permission about hospice level of care and all that home with hospice entails.  Patient wishing to proceed with consulting hospice services and with the having hospice services at home upon discharge.  See below.  NEXT OF KIN Lives at home with family, wife at bedside.  SUMMARY OF RECOMMENDATIONS   Agree with DNR Adequate pain and on pain symptom management More for focus on establishing comfort measures Home with hospice, will reach out to [facility] colleague. Thank you for the consult.  Code Status/Advance Care Planning:  DNR    Symptom Management:      Palliative Prophylaxis:   Delirium Protocol  Additional Recommendations (Limitations, Scope, Preferences):  Full Comfort Care  Psycho-social/Spiritual:   Desire for further Chaplaincy support:yes  Additional Recommendations: Education on Hospice  Prognosis:   < 6 months  Discharge Planning: Home with Hospice      Primary Diagnoses: Present on Admission: . Elevated LFTs . Pancreatic cancer metastasized to liver ([place]) . DVT (deep venous thrombosis) ([place]) . Ventral hernia without obstruction or gangrene . Leukocytosis . Hyponatremia . AKI (acute kidney injury) ([place])   I have reviewed the medical record, interviewed  the patient and family, and examined the patient. The following aspects are pertinent.  Past Medical History:  Diagnosis Date  . BPH (benign prostatic hyperplasia)   . Cancer [facility])    Hepatic  . Coronary artery disease   . Hyperlipidemia   . Hypertension   . Liver failure ([place])    Cirrhosis- alcoholic- Liver transplant 2017  . Pneumonia    as a kid   Social History   Socioeconomic History  . Marital status: Married    Spouse name: Not on file  . Number of children: 1  . Years of education: Not on file  . Highest education level: Not on file  Occupational History  . Occupation: Retired   Tobacco Use  .  Smoking status: Former Smoker    Packs/day: 1.00    Years: 25.00    Pack years: 25.00    Types: Cigarettes    Quit date: 01/12/1998    Years since quitting: 23.1  . Smokeless tobacco: Never Used  Vaping Use  . Vaping Use: Never used  Substance and Sexual Activity  . 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 Drug use: Never  . Sexual activity: Yes  Other Topics Concern  . Not on file  Social History Narrative   Right Handed   Lives in a one story home   Drinks caffeine    Social Determinants of Health   Financial Resource Strain: Not on file  Food Insecurity: Not on file  Transportation Needs: Not on file  Physical Activity: Not on file  Stress: Not on file  Social Connections: Not on file   Family History  Problem Relation Age of Onset  . Heart disease Sister   . Heart disease Brother   . Cancer Brother 60       kidney cancer   . Hyperlipidemia Mother   . Hyperlipidemia Father   . Cancer Father        melanoma   . Heart disease Sister   . Liver disease Sister   . Cancer Sister        liver cancer  . Prostate cancer Brother   . Cancer Brother 60       prosteate cancer  . Colon cancer Neg Hx   . Pancreatic cancer Neg Hx   . Stomach cancer Neg Hx   . Esophageal cancer Neg Hx    Scheduled Meds: . Chlorhexidine Gluconate Cloth  6 each Topical Daily  . Everolimus  0.75 mg Oral BID  . mouth rinse  15 mL Mouth Rinse BID  . mycophenolate  720 mg Oral BID  . tamsulosin  0.4 mg Oral QPC supper   Continuous Infusions: PRN Meds:.albuterol, diphenhydrAMINE, HYDROmorphone (DILAUDID) injection, ondansetron **OR** ondansetron (ZOFRAN) IV, senna-docusate Medications Prior to Admission:  Prior to Admission medications   Medication Sig Start Date End Date Taking? Authorizing Provider  acetaminophen (TYLENOL) 500 MG tablet Take 1,000 mg by mouth every 6 (six) hours as needed for moderate pain.   Yes [provider]   calcium gluconate 500 MG tablet Take 1 tablet by mouth 2 (two) times daily.    Yes [provider]  Everolimus 0.75 MG TABS Take 0.75 mg by mouth 2 (two) times daily.   Yes [provider]  furosemide (LASIX) 40 MG tablet TAKE ONE TABLET BY MOUTH DAILY Patient taking differently: Take 40 mg by mouth daily. 10/31/20  Yes Dimas Chyle  M, MD  gabapentin (NEURONTIN) 300 MG capsule Take 1 capsule (300 mg total) by mouth at bedtime. Patient taking differently: Take 300 mg by mouth 2 (two) times daily. 12/11/20  Yes [name], MD  losartan (COZAAR) 25 MG tablet Take 1 tablet (25 mg total) by mouth daily. 01/15/21  Yes Jerline Pain, MD  methylphenidate (RITALIN) 5 MG tablet Take 5 mg by mouth 2 (two) times daily with breakfast and lunch. 02/08/21 03/10/21 Yes [provider]  Multiple Vitamin (MULTIVITAMIN WITH MINERALS) TABS tablet Take 1 tablet by mouth daily.   Yes [provider]  Mycophenolate Sodium (MYCOPHENOLIC ACID PO) Take 623 mg by mouth 2 (two) times daily.   Yes [provider]  PREVIDENT 5000 BOOSTER PLUS 1.1 % PSTE Take 1 application by mouth daily. 09/19/20  Yes [provider]  rivaroxaban (XARELTO) 20 MG TABS tablet Take 1 tablet (20 mg total) by mouth daily with supper. 01/25/21 04/25/21 Yes [name], MD  rosuvastatin (CRESTOR) 5 MG tablet TAKE ONE TABLET BY MOUTH DAILY Patient taking differently: Take 5 mg by mouth daily. 02/01/21  Yes [name], MD  senna-docusate (SENOKOT-S) 8.6-50 MG tablet Take 1 tablet by mouth at bedtime as needed for mild constipation. 02/08/21  Yes [provider]  tadalafil (CIALIS) 20 MG tablet TAKE ONE TABLET BY MOUTH DAILY Patient taking differently: Take 20 mg by mouth daily as needed for erectile dysfunction. 02/06/21  Yes [name], MD  tamsulosin (FLOMAX) 0.4 MG CAPS capsule TAKE ONE CAPSULE BY MOUTH DAILY Patient taking differently: Take 0.4 mg by mouth daily. 11/11/20  Yes  [name], MD  traMADol (ULTRAM) 50 MG tablet Take 1 tablet (50 mg total) by mouth every 8 (eight) hours as needed for up to 90 doses. Patient taking differently: Take 50 mg by mouth every 8 (eight) hours as needed for moderate pain. 01/30/21  Yes [name], MD  enoxaparin (LOVENOX) 100 MG/ML injection Inject into the skin. Patient not taking: No sig reported 01/25/21   [provider]  ondansetron (ZOFRAN) 8 MG tablet Take 1 tablet (8 mg total) by mouth 2 (two) times daily as needed (Nausea or vomiting). Patient taking differently: Take 8 mg by mouth 2 (two) times daily as needed for nausea or vomiting (Nausea or vomiting). 02/09/21   Truitt Merle, MD  prochlorperazine (COMPAZINE) 10 MG tablet Take 1 tablet (10 mg total) by mouth every 6 (six) hours as needed (Nausea or vomiting). Patient taking differently: Take 10 mg by mouth every 6 (six) hours as needed for nausea or vomiting (Nausea or vomiting). 02/09/21   Truitt Merle, MD  Syringe/Needle, Disp, (SYRINGE 3CC/25GX5/8") 25G X 5/8" 3 ML MISC Use to inject lovenox 01/23/21   [name], MD   Allergies  Allergen Reactions  . Nsaids Other (See Comments)    Liver transplant 09/13/2015    Review of Systems +weakness  Physical Exam Sitting up in bed family at bedside No acute distress Appears to be icteric, has scleral icterus Has 2+ bilateral lower extremity edema Awake alert oriented No focal neurological deficits Lungs clear to auscultation Abdomen mildly distended but not tender Telemetry monitor in the stepdown unit reviewed.  Vital Signs: BP 105/75 (BP Location: Right Arm)   Pulse (!) 132   Temp (!) 97.4 F (36.3 C) (Oral)   Resp (!) 23   Ht 5\' 11"  (1.803 m)   Wt 68.4 kg   SpO2 96%   BMI  21.03 kg/m  Pain Scale: 0-10   Pain Score: 7    SpO2: SpO2: 96 % O2 Device:SpO2: 96 % O2 Flow Rate: .O2 Flow Rate (L/min): 2 L/min  IO: Intake/output summary:   Intake/Output Summary (Last 24 hours) at 02/12/2021  1318 Last data filed at 02/12/2021 0900 Gross per 24 hour  Intake 4640.43 ml  Output 280 ml  Net 4360.43 ml    LBM: Last BM Date: 02/10/21 Baseline Weight: Weight: 68 kg Most recent weight: Weight: 68.4 kg     Palliative Assessment/Data:   PPS 40%  Time In:  12 Time Out:  1300 Time Total:  60  Greater than 50%  of this time was spent counseling and coordinating care related to the above assessment and plan.  Signed by: Loistine Chance, MD   Please contact Palliative Medicine Team phone at [phone] for questions and concerns.  For individual provider: See Shea Evans

## 2021-02-12 NOTE — Progress Notes (Signed)
Manufacturing engineer [facility]) Hospital Liaison: RN note    Notified by Transition of Care Manger of patient/family request for [facility] services at home after discharge. Chart and patient information under review by [facility] physician. Hospice eligibility pending currently.    Writer spoke with patient and his wife to initiate education related to hospice philosophy, services and team approach to care. Both verbalized understanding of information given. Per discussion, plan is for discharge to home by private car.   Please send signed and completed DNR form home with patient/family. Patient will need prescriptions for discharge comfort medications.     DME needs have been discussed, patient currently has the following equipment in the home: none.  Patient/family requests the following DME for delivery to the home: oxygen. [place] equipment manager has been notified and will contact DME provider to arrange delivery to the home. Home address has been verified and is correct in the chart.             Jerene Pitch  is the family member to contact to arrange time of delivery.     [facility] aware of the above. Please notify [facility] when patient is ready to leave the unit at discharge. (Call [phone] or [phone] after 5pm.) [facility] information and contact numbers given to [facility].      A Please do not hesitate to call with questions.    Thank you,   Farrel Gordon, RN, [place] Hospital Liaison  [phone]

## 2021-02-12 NOTE — Progress Notes (Signed)
Nutrition Brief Note  Chart reviewed. Patient screened for MST score of 2.  Dr. Rowe Pavy with Palliative Care currently in with patient and his family. Able to talk with RN who reports diet has been advanced to Regular so patient may eat what he wants and that patient is now comfort care with plan for d/c home with hospice.   Patient now comfort care.  No further nutrition interventions planned at this time.  Please re-consult as needed.       Jarome Matin, MS, RD, LDN, CNSC Inpatient Clinical Dietitian RD pager # available in [place]  After hours/weekend pager # available in [facility]

## 2021-02-12 NOTE — TOC Initial Note (Signed)
Transition of Care [facility]) - Initial/Assessment Note    Patient Details  Name: Brandon [name] MRN: [id number] Date of Birth: 1951/05/22  Transition of Care [facility]) CM/SW Contact:    Leeroy Cha, RN Phone Number: 02/12/2021, 7:59 AM  Clinical Narrative:                 70 y.o. 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 Ernestina Penna office today with worsening abdominal pain and jaundice.  He also complains of increasing fatigue with no energy and patient slept most of the day for the last 2 days.  He complains of chills but no fever at home.  He also complains of decreased urine output and lower extremity swelling.  At cancer center, patient developed fever and tachypnea and was sent to the emergency room.  ED Course: He was found to have leukocytosis with white count of 24 and subsequently 20.7, sodium of 124, potassium of 5.5, BUN of 42, creatinine of 2.73 from 2.91 with AST of 1335, ALT of 521, alkaline phosphatase of 790 and bili total of 11.4.  CT of the abdomen and pelvis without contrast showed hernia without obstruction and pancreatic mass which hepatic metastases with small volume ascites and splenomegaly.  Chest x-ray showed mild basilar atelectasis.  He was started on IV fluids and broad-spectrum antibiotics. PLAN: iv abx x3, temp this am is 101.2, wbc 17.6, K+=5.2 BUN 43 and creat 2.84, na -123, gfr-23 To return to home with spouse. Expected Discharge Plan: Home/Self Care Barriers to Discharge: Continued Medical Work up   Patient Goals and CMS Choice Patient states their goals for this hospitalization and ongoing recovery  are:: to go home CMS Medicare.gov Compare Post Acute Care list provided to:: Patient    Expected Discharge Plan and Services Expected Discharge Plan: Home/Self Care   Discharge Planning Services: CM Consult   Living arrangements for the past 2 months: Single Family Home                                      Prior Living Arrangements/Services Living arrangements for the past 2 months: Single Family Home Lives with:: Spouse Patient language and need for interpreter reviewed:: Yes Do you feel safe going back to the place where you live?: Yes            Criminal Activity/Legal Involvement Pertinent to Current Situation/Hospitalization: No - Comment as needed  Activities of Daily Living Home Assistive Devices/Equipment: None ADL Screening (condition at time of admission) Patient's cognitive ability adequate to safely complete daily activities?: Yes Is the patient deaf or have difficulty hearing?: No Does the patient have difficulty seeing, even when wearing glasses/contacts?: No Does the patient have difficulty concentrating, remembering, or making decisions?: No Patient able to express need for assistance with ADLs?: Yes Does the patient have difficulty dressing or bathing?: No Independently performs ADLs?: No Communication: Independent Dressing (OT): Independent Grooming: Independent Feeding: Independent Bathing: Independent Toileting: Needs assistance Is this a change from baseline?: Change from baseline, expected to last >3days In/Out Bed: Needs assistance Is this a change from baseline?:  Change from baseline, expected to last >3 days Walks in Home: Needs assistance Is this a change from baseline?: Change from baseline, expected to last >3 days Does the patient have difficulty walking or climbing stairs?: Yes Weakness of Legs: Both Weakness of Arms/Hands: Both  Permission Sought/Granted                  Emotional Assessment Appearance:: Appears stated  age Attitude/Demeanor/Rapport: Engaged Affect (typically observed): Calm Orientation: : Oriented to Place,Oriented to Self,Oriented to  Time,Oriented to Situation Alcohol / Substance Use: Not Applicable    Admission diagnosis:  Hyponatremia [E87.1] Adenocarcinoma of pancreas (HCC) [C25.9] Elevated LFTs [R79.89] Sepsis with acute renal failure without septic shock, due to unspecified organism, unspecified acute renal failure type ([place]) [A41.9, R65.20, N17.9] Patient Active Problem List   Diagnosis Date Noted  . Elevated LFTs 02/11/2021  . Leukocytosis 02/11/2021  . Hyponatremia 02/11/2021  . AKI (acute kidney injury) ([place]) 02/11/2021  . Pancreatic cancer metastasized to liver ([place]) 02/07/2021  . DVT (deep venous thrombosis) ([place]) 01/30/2021  . Pancreatic mass 01/30/2021  . Peripheral neuropathy due to chemotherapy ([place]) 12/11/2020  . Elevated blood pressure reading 06/11/2020  . Ventral hernia without obstruction or gangrene 06/11/2020  . Leg edema 08/09/2019  . Rash 08/09/2019  . Dyslipidemia 04/06/2019  . OSA (obstructive sleep apnea) 07/16/2018  . Erectile dysfunction 07/16/2018  . BPH 07/16/2018  . Frequent headaches 07/16/2018  . CAD s/p CABG 07/16/2018  . Liver transplant recipient [facility]) 11/01/2015   PCP:  [name], MD Pharmacy:   [facility] [address] [place] - [address] [place] [address] Phone: [phone] Fax: [phone]     Social Determinants of Health (SDOH) Interventions    Readmission Risk Interventions No flowsheet data found.

## 2021-02-13 ENCOUNTER — Ambulatory Visit: Payer: Medicare Other

## 2021-02-13 ENCOUNTER — Ambulatory Visit: Payer: Medicare Other | Admitting: Hematology

## 2021-02-13 ENCOUNTER — Ambulatory Visit (HOSPITAL_COMMUNITY): Payer: Medicare Other

## 2021-02-13 ENCOUNTER — Other Ambulatory Visit: Payer: Medicare Other

## 2021-02-13 DIAGNOSIS — Z515 Encounter for palliative care: Secondary | ICD-10-CM

## 2021-02-13 DIAGNOSIS — Z7189 Other specified counseling: Secondary | ICD-10-CM

## 2021-02-13 DIAGNOSIS — C259 Malignant neoplasm of pancreas, unspecified: Secondary | ICD-10-CM

## 2021-02-13 LAB — URINE CULTURE: Culture: NO GROWTH

## 2021-02-13 LAB — CANCER ANTIGEN 19-9

## 2021-02-13 MED ORDER — HYDROMORPHONE HCL 2 MG PO TABS: 2.0000 mg | ORAL_TABLET | ORAL | 0 refills | Status: DC | PRN

## 2021-02-13 MED ORDER — MYCOPHENOLATE SODIUM 360 MG PO TBEC: 720.0000 mg | DELAYED_RELEASE_TABLET | Freq: Two times a day (BID) | ORAL | Status: DC

## 2021-02-13 NOTE — Telephone Encounter (Signed)
Lvm to relay message below to [place] at [facility].

## 2021-02-13 NOTE — Progress Notes (Addendum)
Manufacturing engineer [facility]) Hospital Liaison Note:  Quick Update on this Hospice Referral:  Reached out to patient spouse Jerene Pitch) this morning to support. Per Jerene Pitch, the plan is to have DME (oxygen tank/set up for 2L/min via [place]) delivered today at their residence at noon so family can take patient home via private car early this afternoon if patient is medically stable for discharge by [facility] MD.  Patient family has declined portable oxygen tank to be delivered to hospital room for transport stating "it is only for as needed use in the home only and they do not think they will need or use it". Patient spouse also added that patient has not been using oxygen in the room today. Family has [place] contact information if questions arise, for further DME needs or support is needed before discharge.  Please send a signed and completed DNR form home with patient and notify [place] at [phone] when patient is ready to leave the unit.  Please send comfort medication prescriptions home with patient.  [facility] has scheduled an admission nurse visit for 5pm today.  Thank you for allowing [facility] to participate in this patient's care,  Gar Ponto, RN [facility] HLT [phone]

## 2021-02-13 NOTE — Telephone Encounter (Signed)
I am ok with signing hospice orders if the family is ok with that.  Algis Greenhouse. Jerline Pain, MD 02/13/2021 8:08 AM

## 2021-02-13 NOTE — TOC Transition Note (Signed)
Transition of Care [facility]) - CM/SW Discharge Note   Patient Details  Name: Caesar Mannella MRN: [id number] Date of Birth: 23-Sep-1950  Transition of Care [facility]) CM/SW Contact:  Lennart Pall, LCSW Phone Number: 02/13/2021, 2:19 PM   Clinical Narrative:     Pt medically cleared for dc today with [place] care in the home. Family to provide dc transport.  Oxygen for delivery to home.  No further TOC needs.  Final next level of care: Home w Hospice Care Barriers to Discharge: Barriers Resolved   Patient Goals and CMS Choice Patient states their goals for this hospitalization and ongoing recovery are:: to go home CMS Medicare.gov Compare Post Acute Care list provided to:: Patient    Discharge Placement                       Discharge Plan and Services   Discharge Planning Services: CM Consult            DME Arranged: Oxygen DME Agency: [employer] Date DME Agency Contacted:  (ordered by [facility] liaison)     [place] Arranged: RN [place] Agency: Hospice and [place] (([facility]))     Representative spoke with at [place]: Gar Ponto  Social Determinants of Health ([place]) Interventions     Readmission Risk Interventions No flowsheet data found.

## 2021-02-13 NOTE — Progress Notes (Signed)
Daily Progress Note   Patient Name: Brandon [name]       Date: 02/13/2021 DOB: 05/04/1951  Age: 70 y.o. MRN#: [id number] Attending Physician: Domenic Polite, MD Primary Care Physician: [name], MD Admit Date: 02/11/2021  Reason for Consultation/Follow-up: Establishing goals of care  Subjective: Patient has since been transferred out of the stepdown unit on to a medical floor.  He is in no distress.  He rested somewhat overnight.  Arrangements are being made for the patient to go home with hospice later today.  Patient's wife is in the room, on the phone with hospice liaison.  Patient's brother also present at the bedside.  Reviewed again briefly about hospice philosophy of care and next steps.  Length of Stay: 2  Current Medications: Scheduled Meds:  . Chlorhexidine Gluconate Cloth  6 each Topical Daily  . Everolimus  0.75 mg Oral BID  . mouth rinse  15 mL Mouth Rinse BID  . mycophenolate  720 mg Oral BID  . tamsulosin  0.4 mg Oral QPC supper    Continuous Infusions:   PRN Meds: albuterol, diphenhydrAMINE, HYDROmorphone (DILAUDID) injection, HYDROmorphone, LORazepam, ondansetron **OR** ondansetron (ZOFRAN) IV, senna-docusate  Physical Exam         Appears icteric Awake alert resting in bed Had abdominal discomfort and took p.o. opioids earlier today Is able to speak in full sentences, not short of breath Regular work of breathing Trace edema No focal deficits  Vital Signs: BP (!) 142/84 (BP Location: Left Arm)   Pulse (!) 110   Temp 98.9 F (37.2 C) (Oral)   Resp 20   Ht 5\' 11"  (1.803 m)   Wt 68.4 kg   SpO2 96%   BMI 21.03 kg/m  SpO2: SpO2: 96 % O2 Device: O2 Device: Nasal Cannula O2 Flow Rate: O2 Flow Rate (L/min): 2 L/min  Intake/output summary:    Intake/Output Summary (Last 24 hours) at 02/13/2021 1134 Last data filed at 02/13/2021 0600 Gross per 24 hour  Intake 120 ml  Output 120 ml  Net 0 ml   LBM: Last BM Date: 02/10/21 Baseline Weight: Weight: 68 kg Most recent weight: Weight: 68.4 kg       Palliative Assessment/Data:      Patient Active Problem List   Diagnosis Date Noted  . Elevated LFTs 02/11/2021  .  Leukocytosis 02/11/2021  . Hyponatremia 02/11/2021  . AKI (acute kidney injury) ([place]) 02/11/2021  . Pancreatic cancer metastasized to liver ([place]) 02/07/2021  . DVT (deep venous thrombosis) ([place]) 01/30/2021  . Pancreatic mass 01/30/2021  . Peripheral neuropathy due to chemotherapy ([place]) 12/11/2020  . Elevated blood pressure reading 06/11/2020  . Ventral hernia without obstruction or gangrene 06/11/2020  . Leg edema 08/09/2019  . Rash 08/09/2019  . Dyslipidemia 04/06/2019  . OSA (obstructive sleep apnea) 07/16/2018  . Erectile dysfunction 07/16/2018  . BPH 07/16/2018  . Frequent headaches 07/16/2018  . CAD s/p CABG 07/16/2018  . Liver transplant recipient [facility]) 11/01/2015    Palliative Care Assessment & Plan   Patient Profile:    Assessment: 70 year old gentleman with a life limiting illness of metastatic pancreatic cancer. Hospitalized at [facility] long hospital in [place], [place] under hospital medicine service with medical oncology as well as palliative services following for possible sepsis acute kidney injury and liver dysfunction. Comfort measures and hospice was elected by the patient-hospice consultation.   Recommendations/Plan:  DNR/DNI  P.o. as needed opioid-Dilaudid for pain management  Home with hospice  Anticipate discharge today  No further palliative care recommendations. Palliative performance scale 50%. Goals of Care and Additional Recommendations:  Limitations on Scope of Treatment: Full Comfort Care  Code Status:    Code Status Orders  (From admission, onward)          Start     Ordered   02/11/21 2248  Do not attempt resuscitation (DNR)  Continuous       Question Answer Comment  In the event of cardiac or respiratory ARREST Do not call a "code blue"   In the event of cardiac or respiratory ARREST Do not perform Intubation, CPR, defibrillation or ACLS   In the event of cardiac or respiratory ARREST Use medication by any route, position, wound care, and other measures to relive pain and suffering. May use oxygen, suction and manual treatment of airway obstruction as needed for comfort.      02/11/21 2247        Code Status History    Date Active Date Inactive Code Status Order ID Comments User Context   02/11/2021 1810 02/11/2021 2247 Full Code [id number]  Aline August, MD ED   Advance Care Planning Activity    Advance Directive Documentation   Flowsheet Row Most Recent Value  Type of Advance Directive Healthcare Power of Attorney, Living will  Pre-existing out of facility DNR order (yellow form or pink MOST form) --  "MOST" Form in Place? --       Prognosis:   < 6 months  Discharge Planning:  Home with Hospice  Care plan was discussed with patient, wife and brother who are present at the bedside.  Thank you for allowing the Palliative Medicine Team to assist in the care of this patient.   Time In: 11 Time Out: 11.25 Total Time 25 Prolonged Time Billed  no       Greater than 50%  of this time was spent counseling and coordinating care related to the above assessment and plan.  Loistine Chance, MD  Please contact Palliative Medicine Team phone at [phone] for questions and concerns.

## 2021-02-13 NOTE — Telephone Encounter (Signed)
Message below given by Andee Poles at front desk.

## 2021-02-14 NOTE — Discharge Summary (Signed)
Physician Discharge Summary  Brandon [name] NWG:[id number] DOB: 08-28-1951 DOA: 02/11/2021  PCP: [name], MD  Admit date: 02/11/2021 Discharge date: 02/13/2021  Time spent: 35 minutes  Recommendations for Outpatient Follow-up:  Home with hospice services  Discharge Diagnoses:  Metastatic pancreatic carcinoma Worsening liver failure Alcoholic liver cirrhosis, liver transplant CAD/CABG   Liver transplant recipient [facility])   Ventral hernia without obstruction or gangrene   DVT (deep venous thrombosis) ([facility])   Pancreatic cancer metastasized to liver ([facility])   Leukocytosis   Hyponatremia   AKI (acute kidney injury) ([place])   Palliative care by specialist   Goals of care, counseling/discussion   Adenocarcinoma of pancreas [facility])   Discharge Condition: Guarded  Diet recommendation: Comfort feeds  Filed Weights   02/11/21 1622 02/12/21 0500  Weight: 68 kg 68.4 kg    History of present illness:  70 year old male with history of alcoholic liver cirrhosis with liver transplant in 2017 on chronic immunosuppression, metastatic pancreatic adenocarcinoma with liver mets, CAD/CABG, diastolic CHF, DVT/PE was admitted to the hospital from the cancer center with increasing abdominal pain fatigue somnolence jaundice and failure to thrive  Hospital Course:   Metastatic pancreatic cancer Worsening liver failure Liver transplant in 2017 Acute kidney injury DVT/PE -Admitted with weakness and ongoing failure to thrive, also noted to have rapidly worsening liver function and performance status -Initially oncology had recommended considering palliative chemotherapy however with worsening liver function and performance status hospice was felt to be more appropriate -Had a palliative care meeting this admission and subsequently transitioned to comfort measures, plan for discharge home with hospice for symptom focused care  Consultations: Oncology, palliative medicine  Discharge Exam: Vitals:    02/12/21 0900 02/12/21 1554  BP: 105/75 (!) 142/84  Pulse: (!) 132 (!) 110  Resp: (!) 23 20  Temp:  98.9 F (37.2 C)  SpO2: 96% 96%    General: AAOx3 Cardiovascular: S1S2/RRR Respiratory: CTAB  Discharge Instructions   Discharge Instructions     Diet general   Complete by: As directed    Increase activity slowly   Complete by: As directed       Allergies as of 02/13/2021       Reactions   Nsaids Other (See Comments)   Liver transplant 09/13/2015        Medication List     STOP taking these medications    calcium gluconate 500 MG tablet   enoxaparin 100 MG/ML injection Commonly known as: LOVENOX   Everolimus 0.75 MG Tabs   furosemide 40 MG tablet Commonly known as: LASIX   losartan 25 MG tablet Commonly known as: COZAAR   methylphenidate 5 MG tablet Commonly known as: RITALIN   multivitamin with minerals Tabs tablet   MYCOPHENOLIC ACID PO   PreviDent 5000 Booster Plus 1.1 % Pste Generic drug: Sodium Fluoride   rivaroxaban 20 MG Tabs tablet Commonly known as: XARELTO   rosuvastatin 5 MG tablet Commonly known as: CRESTOR   tadalafil 20 MG tablet Commonly known as: CIALIS       TAKE these medications    acetaminophen 500 MG tablet Commonly known as: TYLENOL Take 1,000 mg by mouth every 6 (six) hours as needed for moderate pain.   gabapentin 300 MG capsule Commonly known as: NEURONTIN Take 1 capsule (300 mg total) by mouth at bedtime. What changed: when to take this   HYDROmorphone 2 MG tablet Commonly known as: DILAUDID Take 1 tablet (2 mg total) by mouth every 4 (four) hours as needed for  severe pain.   ondansetron 8 MG tablet Commonly known as: Zofran Take 1 tablet (8 mg total) by mouth 2 (two) times daily as needed (Nausea or vomiting). What changed: reasons to take this   prochlorperazine 10 MG tablet Commonly known as: COMPAZINE Take 1 tablet (10 mg total) by mouth every 6 (six) hours as needed (Nausea or vomiting). What  changed: reasons to take this   senna-docusate 8.6-50 MG tablet Commonly known as: Senokot-S Take 1 tablet by mouth at bedtime as needed for mild constipation.   SYRINGE 3CC/25GX5/8" 25G X 5/8" 3 ML Misc Use to inject lovenox   tamsulosin 0.4 MG Caps capsule Commonly known as: FLOMAX TAKE ONE CAPSULE BY MOUTH DAILY   traMADol 50 MG tablet Commonly known as: ULTRAM Take 1 tablet (50 mg total) by mouth every 8 (eight) hours as needed for up to 90 doses. What changed: reasons to take this       Allergies  Allergen Reactions   Nsaids Other (See Comments)    Liver transplant 09/13/2015       The results of significant diagnostics from this hospitalization (including imaging, microbiology, ancillary and laboratory) are listed below for reference.    Significant Diagnostic Studies: CT ABDOMEN PELVIS WO CONTRAST  Result Date: 02/11/2021 CLINICAL DATA:  Abdominal pain and fever. Pancreatic cancer. History of liver transplant. EXAM: CT ABDOMEN AND PELVIS WITHOUT CONTRAST TECHNIQUE: Multidetector CT imaging of the abdomen and pelvis was performed following the standard protocol without IV contrast. COMPARISON:  CT 01/17/2021 at an outside institution. FINDINGS: Lower chest: Cardiomegaly with calcification of native coronary arteries. Median sternotomy. Right middle lobe nodule is calcified and consistent with prior granulomatous disease. Bandlike atelectasis within both lower lobes. Hepatobiliary: Heterogeneous liver with multiple ill-defined low-density liver lesions, better appreciated on prior contrast-enhanced exam. These appear more prominent and confluent within the left hepatic lobe. Surgical clips at the liver hilum. Direct comparison is difficult due to lack of IV contrast on the current exam. Pancreas: Hypodense pancreatic tail mass measures approximately 3.5 cm, similar to prior exam. No peripancreatic fat stranding or ductal dilatation. Spleen: Splenomegaly with spleen spanning 17.2  cm in AP dimension. Previous splenic infarct is faintly visualized in the superior aspect, grossly unchanged. Adrenals/Urinary Tract: No adrenal nodule. Mild bilateral renal parenchymal thinning and perinephric edema. No hydronephrosis. No renal calculi. No evidence of focal renal lesion. Physiologically distended urinary bladder with equivocal wall thickening. Stomach/Bowel: Bowel evaluation is limited in the absence of contrast. Decompressed stomach. Occasional fluid-filled loops of small bowel without abnormal distension or obstruction. No obvious small bowel wall thickening or inflammation. Air-filled appendix without appendicitis. There is submucosal fatty infiltration involving the ascending and transverse colon. Richter's type hernia involving the upper ventral abdominal wall containing anti mesenteric border of transverse colon. There is mild wall thickening of the adjacent colonic segment but no pericolonic inflammation. Liquid and solid stool in the descending and sigmoid colon. Occasional colonic diverticulosis without diverticulitis. Sigmoid colon is redundant. Vascular/Lymphatic: Aorto bi-iliac atherosclerosis. There is a 12 mm portal caval node, series 2, image 39, not significantly changed from prior. Multiple small retroperitoneal lymph nodes are not enlarged by size criteria. Reproductive: Mildly enlarged prostate gland spanning 5.3 cm transverse. Other: Small volume perihepatic ascites is new. Small volume of free fluid within both pericolic gutters and tracking into the pelvis. Serpiginous densities in the anterior upper omental fat correspond to vessels on prior exam. Supraumbilical ventral abdominal wall hernia now contains anti mesenteric border of transverse colon.  There is an additional fat containing ventral abdominal wall hernia below the hernia containing transverse colon. This contains mild edema and trace fluid. Bilateral fat containing inguinal hernias. No free intra-abdominal air.  Musculoskeletal: Bones are diffusely under mineralized. Sclerotic densities in the right acetabulum are again seen. No acute osseous abnormalities are seen. IMPRESSION: 1. Richter's type hernia involving the upper ventral abdominal wall containing anti mesenteric border of transverse colon. There is mild wall thickening of the adjacent transverse colon but no significant inflammation or evidence of obstruction. There is an additional fat containing ventral abdominal wall hernia below the hernia containing transverse colon. This contains mild edema and trace fluid. 2. Pancreatic mass with hepatic metastasis. Multiple ill-defined low-density liver lesions, better appreciated on prior contrast-enhanced exam. These appear more prominent and confluent within the left hepatic lobe. 3. Small volume of ascites in the abdomen and pelvis is new. 4. Submucosal fatty infiltration involving the ascending and transverse colon, can be seen with chronic inflammation. 5. Splenomegaly. Splenic infarct is grossly stable from prior, not well-defined on the current exam in the absence of IV contrast. 6. Mild chronic bladder wall thickening. Mildly enlarged prostate gland. Aortic Atherosclerosis (ICD10-I70.0). Electronically Signed   By: Keith Rake M.D.   On: 02/11/2021 17:20   DG Chest Portable 1 View  Result Date: 02/11/2021 CLINICAL DATA:  Shortness of breath, fever EXAM: PORTABLE CHEST 1 VIEW COMPARISON:  Partial comparison to CT abdomen/pelvis dated 02/11/2021. Outside hospital CT chest dated 01/17/2021 FINDINGS: Mild bibasilar scarring/atelectasis, better evaluated on recent CT. No focal consolidation. No pleural effusion or pneumothorax. Cardiomegaly.  Postsurgical changes related to prior CABG. Status post ORIF of the left clavicle.  Median sternotomy. IMPRESSION: Mild bibasilar scarring/atelectasis. No evidence of acute cardiopulmonary disease. Electronically Signed   By: Julian Hy M.D.   On: 02/11/2021 17:48     Microbiology: Recent Results (from the past 240 hour(s))  Culture, Blood     Status: None (Preliminary result)   Collection Time: 02/11/21  2:37 PM   Specimen: BLOOD LEFT ARM  Result Value Ref Range Status   Specimen Description BLOOD LEFT ARM  Final   Special Requests   Final    BOTTLES DRAWN AEROBIC AND ANAEROBIC Blood Culture adequate volume   Culture   Final    NO GROWTH 3 DAYS Performed at [facility], [address] [place], [place] [address]    Report Status PENDING  Incomplete  Culture, Blood     Status: None (Preliminary result)   Collection Time: 02/11/21  2:44 PM   Specimen: BLOOD RIGHT ARM  Result Value Ref Range Status   Specimen Description BLOOD RIGHT ARM  Final   Special Requests   Final    BOTTLES DRAWN AEROBIC AND ANAEROBIC Blood Culture adequate volume   Culture   Final    NO GROWTH 3 DAYS Performed at [facility], [place] [address], [place], [place] [address]    Report Status PENDING  Incomplete  Resp Panel by RT-PCR (Flu A&B, Covid) Nasopharyngeal Swab     Status: None   Collection Time: 02/11/21  4:23 PM   Specimen: Nasopharyngeal Swab; Nasopharyngeal(NP) swabs in vial transport medium  Result Value Ref Range Status   SARS Coronavirus 2 by RT PCR NEGATIVE NEGATIVE Final    Comment: (NOTE) SARS-CoV-2 target nucleic acids are NOT DETECTED.  The SARS-CoV-2 RNA is generally detectable in upper respiratory specimens during the acute phase of infection. The lowest concentration of SARS-CoV-2 viral copies  this assay can detect is 138 copies/mL. A negative result does not preclude SARS-Cov-2 infection and should not be used as the sole basis for treatment or other patient management decisions. A negative result may occur with  improper specimen collection/handling, submission of specimen other than nasopharyngeal swab, presence of viral mutation(s) within the areas targeted by this assay, and inadequate number of viral copies(<138 copies/mL). A  negative result must be combined with clinical observations, patient history, and epidemiological information. The expected result is Negative.  Fact Sheet for Patients:  EntrepreneurPulse.com.au  Fact Sheet for Healthcare Providers:  IncredibleEmployment.be  This test is no t yet approved or cleared by the [place] FDA and  has been authorized for detection and/or diagnosis of SARS-CoV-2 by FDA under an Emergency Use Authorization (EUA). This EUA will remain  in effect (meaning this test can be used) for the duration of the COVID-19 declaration under Section 564(b)(1) of the Act, 21 U.S.C.section 360bbb-3(b)(1), unless the authorization is terminated  or revoked sooner.       Influenza A by PCR NEGATIVE NEGATIVE Final   Influenza B by PCR NEGATIVE NEGATIVE Final    Comment: (NOTE) The Xpert Xpress SARS-CoV-2/FLU/RSV plus assay is intended as an aid in the diagnosis of influenza from Nasopharyngeal swab specimens and should not be used as a sole basis for treatment. Nasal washings and aspirates are unacceptable for Xpert Xpress SARS-CoV-2/FLU/RSV testing.  Fact Sheet for Patients: EntrepreneurPulse.com.au  Fact Sheet for Healthcare Providers: IncredibleEmployment.be  This test is not yet approved or cleared by the [place] FDA and has been authorized for detection and/or diagnosis of SARS-CoV-2 by FDA under an Emergency Use Authorization (EUA). This EUA will remain in effect (meaning this test can be used) for the duration of the COVID-19 declaration under Section 564(b)(1) of the Act, 21 U.S.C. section 360bbb-3(b)(1), unless the authorization is terminated or revoked.  Performed at [facility], [place] [address], [place], [place] [address]   Urine culture     Status: None   Collection Time: 02/12/21  3:00 AM   Specimen: Urine, Clean Catch  Result Value Ref Range Status    Specimen Description   Final    URINE, CLEAN CATCH Performed at [facility], [place] [address], [place], [place] [address]    Special Requests   Final    NONE Performed at [facility], [place] [address], [place], [place] [address]    Culture   Final    NO GROWTH Performed at [facility], [place] [address], [place], [place] [address]    Report Status 02/13/2021 FINAL  Final     Labs: Basic Metabolic Panel: Recent Labs  Lab 02/11/21 1301 02/11/21 1623 02/12/21 0030  NA 127* 124* 123*  K 5.7* 5.5* 5.2*  CL 90* 91* 89*  CO2 22 22 22   GLUCOSE 100* 120* 115*  BUN 40* 42* 43*  CREATININE 2.91* 2.73* 2.84*  CALCIUM 9.7 8.5* 8.3*  MG  --   --  2.3   Liver Function Tests: Recent Labs  Lab 02/11/21 1301 02/11/21 1623 02/12/21 0030  AST 1,405* 1,335* 1,203*  ALT 580* 521* 512*  ALKPHOS 1,054* 790* 718*  BILITOT 12.7* 11.4* 10.6*  PROT 6.5 6.1* 5.8*  ALBUMIN 2.1* 2.1* 2.1*   Recent Labs  Lab 02/11/21 1440 02/11/21 1623  LIPASE 21 20   No results for input(s): AMMONIA in the last 168 hours. CBC: Recent Labs  Lab 02/11/21 1301 02/11/21 1623  02/12/21 0030  WBC 24.0* 20.7* 17.6*  NEUTROABS 19.5* 18.0* 14.4*  HGB 11.3* 10.3* 10.0*  HCT 32.1* 30.5* 29.8*  MCV 80.3 81.6 82.5  PLT 303 256 226   Cardiac Enzymes: No results for input(s): CKTOTAL, CKMB, CKMBINDEX, TROPONINI in the last 168 hours. BNP: BNP (last 3 results) No results for input(s): BNP in the last 8760 hours.  ProBNP (last 3 results) No results for input(s): PROBNP in the last 8760 hours.  CBG: No results for input(s): GLUCAP in the last 168 hours.     Signed:  Domenic Polite MD.  [facility] 02/14/2021, 2:19 PM

## 2021-02-16 LAB — CULTURE, BLOOD (SINGLE)
Culture: NO GROWTH
Culture: NO GROWTH
Special Requests: ADEQUATE
Special Requests: ADEQUATE

## 2021-02-18 ENCOUNTER — Other Ambulatory Visit: Payer: Self-pay

## 2021-02-18 DIAGNOSIS — C787 Secondary malignant neoplasm of liver and intrahepatic bile duct: Secondary | ICD-10-CM

## 2021-02-18 MED ORDER — HYDROMORPHONE HCL 2 MG PO TABS: 2.0000 mg | ORAL_TABLET | ORAL | 0 refills | Status: AC | PRN

## 2021-02-18 NOTE — Telephone Encounter (Signed)
Send to PCP.

## 2021-02-18 NOTE — Telephone Encounter (Signed)
  LAST APPOINTMENT DATE: 01/25/2021   NEXT APPOINTMENT DATE:@Visit  date not found  MEDICATION:HYDROmorphone (DILAUDID) 2 MG tablet  PHARM[facility]Y:[facility] [address] [place]  Comments: Has two days left of medication  Please advise

## 2021-02-19 ENCOUNTER — Ambulatory Visit: Payer: Medicare Other

## 2021-02-20 ENCOUNTER — Ambulatory Visit: Payer: Medicare Other

## 2021-02-20 ENCOUNTER — Ambulatory Visit: Payer: Medicare Other | Admitting: Hematology

## 2021-02-20 NOTE — Progress Notes (Signed)
Received call from patient's wife Jerene Pitch.  Patient has a lot of questions regarding what his disease process is and what end of life looks like.  He would like to speak with Dr. Burr Medico.  I have arranged for Dr. Burr Medico to call them tomorrow 02/21/2021 around 11:20.  I explained to her that it may not be exactly at that time.  She verbalized an understanding.

## 2021-02-20 NOTE — Progress Notes (Signed)
[place]   [phone] [phone]   Clinic Follow up Note   Patient Care Team: [name], MD as PCP - General (Family Medicine) Jerline Pain, MD as PCP - Cardiology (Cardiology) Dorris Fetch Dorna Bloom, MD as Consulting Physician (Gastroenterology) Alda Berthold, DO as Consulting Physician (Neurology) Madelin Rear, [facility] as Pharmacist (Pharmacist) Truitt Merle, MD as Consulting Physician (Oncology)  Date of Service:  02/21/2021  I connected with Brandon [name] on 02/21/2021 at 11:20 AM EDT by telephone visit and verified that I am speaking with the correct person using two identifiers.  I discussed the limitations, risks, security and privacy concerns of performing an evaluation and management service by telephone and the availability of in person appointments. I also discussed with the patient that there may be a patient responsible charge related to this service. The patient expressed understanding and agreed to proceed.   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  Limited evaluation of the liver parenchyma. Multiple hepatic masses are  visualized. Additionally, the IVC demonstrates internal echoes concerning  for thrombus/mass. Recommend [place] protocol CT.  2.  Blood flow in the normal direction in the major hepatic blood vessels    01/17/2021 Imaging   CT A/P  Impression:  1.  Filling defect of a right lower lobe pulmonary vessel, incompletely visualized but likely representing pulmonary embolism. Adjacent small right pleural effusion. Recommend dedicated CT chest PE for further evaluation.   2.  Innumerable new hepatic masses concerning for metastatic disease. Biopsy recommended.  3.  Pancreatic tail lesion, which may represent primary pancreatic primary neoplasm versus metastasis.  4.  Paraesophageal lymphadenopathy concerning for nodal metastasis.    01/31/2021 Pathology Results   Liver Lesion FNA  Adenocarcinoma, see comment.   Comment: In the context of the patient's pancreatic mass, the morphology and immunophenotype are most consistent with a pancreaticobiliary primary.  The following immunohistochemistry was performed after review of the clinical history and morphology to further characterize the pathologic process. 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 He was last seen by me in the office on 02/11/21. I also saw him in  the hospital during his admission 02/11/21 through 02/13/21.  He notes his bowel movements have improved. He has increased his miralax and senokot. He also reports painful leg swelling. He denies nausea. His jaundice has been fluctuating recently.  All other systems were reviewed with the patient and are negative.  MEDICAL HISTORY:  Past Medical History:  Diagnosis Date   BPH (benign prostatic hyperplasia)    Cancer (HCC)    Hepatic   Coronary artery disease    Hyperlipidemia    Hypertension    Liver failure ([place])    Cirrhosis- alcoholic- Liver transplant 2017   Pneumonia    as a kid    SURGICAL  HISTORY: Past Surgical History:  Procedure Laterality Date   COLONOSCOPY W/ POLYPECTOMY     CORONARY ARTERY BYPASS GRAFT  07/2009   LIVER TRANSPLANT  2017   ORIF CLAVICULAR FRACTURE Left 04/29/2019   Procedure: OPEN REDUCTION INTERNAL FIXATION (ORIF) CLAVICULAR FRACTURE;  Surgeon: Altamese Bowles, MD;  Location: [place];  Service: Orthopedics;  Laterality: Left;    I have reviewed the social history and family history with the patient and they are unchanged from previous note.  ALLERGIES:  is allergic to nsaids.  MEDICATIONS:  Current Outpatient Medications  Medication Sig Dispense Refill   acetaminophen (TYLENOL) 500 MG tablet Take 1,000 mg by mouth every 6 (six) hours as needed for moderate pain.     gabapentin (NEURONTIN) 300 MG capsule Take 1 capsule (300 mg total) by mouth at bedtime. (Patient taking differently: Take 300 mg by mouth 2 (two) times daily.) 90 capsule 3   HYDROmorphone (DILAUDID) 2 MG tablet Take 1 tablet (2 mg total) by mouth every 4 (four) hours as needed for severe pain. 60 tablet 0   ondansetron (ZOFRAN) 8 MG tablet Take 1 tablet (8 mg total) by mouth 2 (two) times daily as needed (Nausea or vomiting). (Patient taking differently: Take 8 mg by mouth 2 (two) times daily as needed for nausea or vomiting (Nausea or vomiting).) 30 tablet 1   prochlorperazine (COMPAZINE) 10 MG tablet Take 1 tablet (10 mg total) by mouth every 6 (six) hours as needed (Nausea or vomiting). (Patient taking differently: Take 10 mg by mouth every 6 (six) hours as needed for nausea or vomiting (Nausea or vomiting).) 30 tablet 1   senna-docusate (SENOKOT-S) 8.6-50 MG tablet Take 1 tablet by mouth at bedtime as needed for mild constipation.     Syringe/Needle, Disp, (SYRINGE 3CC/25GX5/8") 25G X 5/8" 3 ML MISC Use to inject lovenox 50 each 0   tamsulosin (FLOMAX) 0.4 MG CAPS capsule TAKE ONE CAPSULE BY MOUTH DAILY (Patient taking differently: Take 0.4 mg by mouth daily.) 30 capsule 5   traMADol  (ULTRAM) 50 MG tablet Take 1 tablet (50 mg total) by mouth every 8 (eight) hours as needed for up to 90 doses. (Patient taking differently: Take 50 mg by mouth every 8 (eight) hours as needed for moderate pain.) 90 tablet 0   No current facility-administered medications for this visit.    PHYSICAL EXAMINATION: ECOG PERFORMANCE STATUS: 3 - Symptomatic, >50% confined to bed  No vitals taken today, Exam not performed today   LABORATORY DATA:  I have reviewed the data as listed CBC Latest Ref Rng & Units 02/12/2021 02/11/2021 02/11/2021  WBC 4.0 - 10.5 K/uL 17.6(H) 20.7(H) 24.0(H)  Hemoglobin 13.0 - 17.0 g/dL 10.0(L) 10.3(L) 11.3(L)  Hematocrit 39.0 - 52.0 % 29.8(L) 30.5(L) 32.1(L)  Platelets 150 - 400 K/uL 226 256 303  CMP Latest Ref Rng & Units 02/12/2021 02/11/2021 02/11/2021  Glucose 70 - 99 mg/dL 115(H) 120(H) 100(H)  BUN 8 - 23 mg/dL 43(H) 42(H) 40(H)  Creatinine 0.61 - 1.24 mg/dL 2.84(H) 2.73(H) 2.91(H)  Sodium 135 - 145 mmol/L 123(L) 124(L) 127(L)  Potassium 3.5 - 5.1 mmol/L 5.2(H) 5.5(H) 5.7(H)  Chloride 98 - 111 mmol/L 89(L) 91(L) 90(L)  CO2 22 - 32 mmol/L 22 22 22   Calcium 8.9 - 10.3 mg/dL 8.3(L) 8.5(L) 9.7  Total Protein 6.5 - 8.1 g/dL 5.8(L) 6.1(L) 6.5  Total Bilirubin 0.3 - 1.2 mg/dL 10.6(H) 11.4(H) 12.7(HH)  Alkaline Phos 38 - 126 U/L 718(H) 790(H) 1,054(H)  AST 15 - 41 U/L 1,203(H) 1,335(H) 1,405(HH)  ALT 0 - 44 U/L 512(H) 521(H) 580(HH)      RADIOGRAPHIC STUDIES: I have personally reviewed the radiological images as listed and agreed with the findings in the report. No results found.   ASSESSMENT & PLAN:  Brandon [name] is a 70 y.o. male with   1. symptom management: pain, constipation and leg edema  -we reviewed the management with pt, and his wife, hospice nurse Mendel Ryder was there too  -will order Lasix 20 mg daily for 5 days, to see if that helps his leg edema.  If not, hospice nurse Ria Comment plan to do leg wrapping -He is currently on Dilaudid and tramadol, will  increase tramadol to 100 mg as needed, he only takes Dilaudid once a day due to drowsiness -His constipation has improved, he is on multiple laxatives now -Mendel Ryder will check on him twice a week   2. 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 He presented with fatigue, weight loss, anorexia, and acute DVT and PE -02/07/21 CT A/P showed a pancreatic tail lesion, innumerable new hepatic masses, and paraesophageal lymphadenopathy.  -CA 19-9 on 01/18/21 was 376,283. Bilirubin increased from 0.9 on 5/12 and went up to 3.7 on 5/26.  -FNA of liver on 01/31/21 confirmed metastatic adenocarcinoma. The IHC studies supported upper GI primary, this is considered with pancreatic adenocarcinoma. -His cancer is stage IV and no longer curable and overall very poor prognosis, due to his rapid worsening of liver function and poor performance status, I recommend hospice     4. Goal of care, DNR    5. Acute DVT of right calf and PE, b/l leg edema  -confirmed on doppler 01/14/21 and CTA on 01/31/2021 -He was on Xarelto, right leg edema improved.  -He has developed bilateral leg/feet swelling and pain lately. I recommend lasix to try and improve the swelling. He can also wrap his legs in compression bandages.      PLAN:  -nurse Ria Comment will refill his tramadol and prescribe lasix for him. -he will try high-dose tramadol, he also has Dilaudid as needed for pain -Hospice nurse Ria Comment will see him twice a week -I will remain to be his MD when he is under hospice care, he knows to call me if he has questions.    No problem-specific Assessment & Plan notes found for this encounter.   No orders of the defined types were placed in this encounter.  All questions were answered. The patient knows to call the clinic with any problems, questions or concerns. No barriers to learning was detected. The total time spent in the appointment  was 15 minutes.     Truitt Merle, MD 02/21/2021   I, Wilburn Mylar, am acting as scribe for [employer]  Burr Medico, MD.   I have reviewed the above documentation for accuracy and completeness, and I agree with the above.

## 2021-02-21 ENCOUNTER — Encounter: Payer: Self-pay | Admitting: Hematology

## 2021-02-21 ENCOUNTER — Inpatient Hospital Stay (HOSPITAL_BASED_OUTPATIENT_CLINIC_OR_DEPARTMENT_OTHER): Payer: Medicare Other | Admitting: Hematology

## 2021-02-21 DIAGNOSIS — C787 Secondary malignant neoplasm of liver and intrahepatic bile duct: Secondary | ICD-10-CM

## 2021-02-21 DIAGNOSIS — C259 Malignant neoplasm of pancreas, unspecified: Secondary | ICD-10-CM

## 2021-02-22 ENCOUNTER — Other Ambulatory Visit: Payer: Self-pay | Admitting: Hematology

## 2021-02-26 ENCOUNTER — Telehealth: Payer: Self-pay | Admitting: *Deleted

## 2021-02-26 NOTE — Telephone Encounter (Signed)
Received vm call from [facility]/Hospice/RN stating that virtual call last week resulted in Pt receiving lasix 20 mg daily x 5 days.  Pt reports that it seems to help first couple of days but not as much last couple of days.  He reports that he had taken 40 mg in past per PCP-Dr Dimas Chyle (not sure why) but it helped & she wants to see if Dr Burr Medico would order 40 mg daily x 5-10 days to see if it would help swelling from thigh down.  If so, order to [facility].  Call back # is [phone].  Message routed to Dr Feng/Pod RN

## 2021-03-06 IMAGING — RF LEFT CLAVICLE - 2+ VIEWS
1 series · 2 of 2 positions shown · non-contrast
Comparison: April 22, 2011

CLINICAL DATA: 68-year-old male with a history of open reduction
internal fixation clavicle

EXAM:
LEFT CLAVICLE - 2+ VIEWS; DG C-ARM 1-60 MIN

[Series 1: run · 2 of 2 slices shown]
[im 1/2]
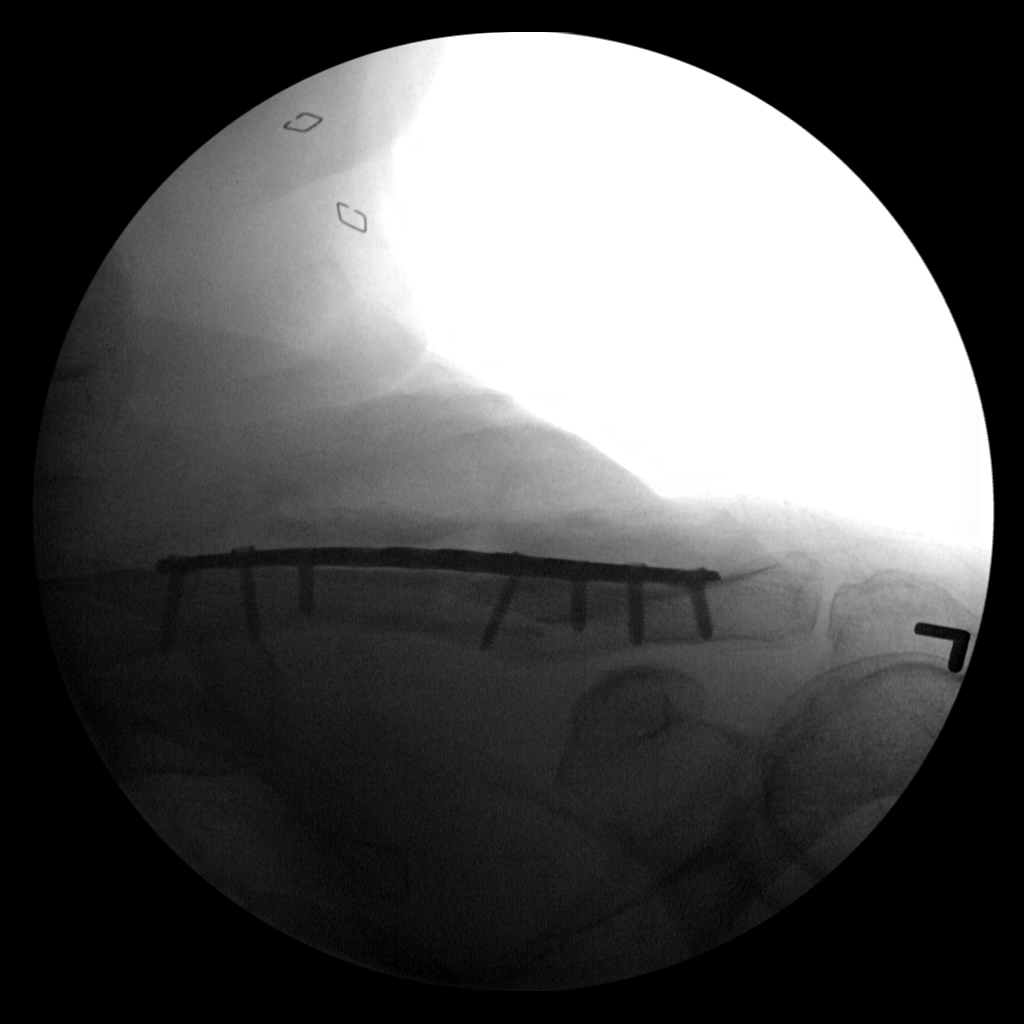
[im 2/2]
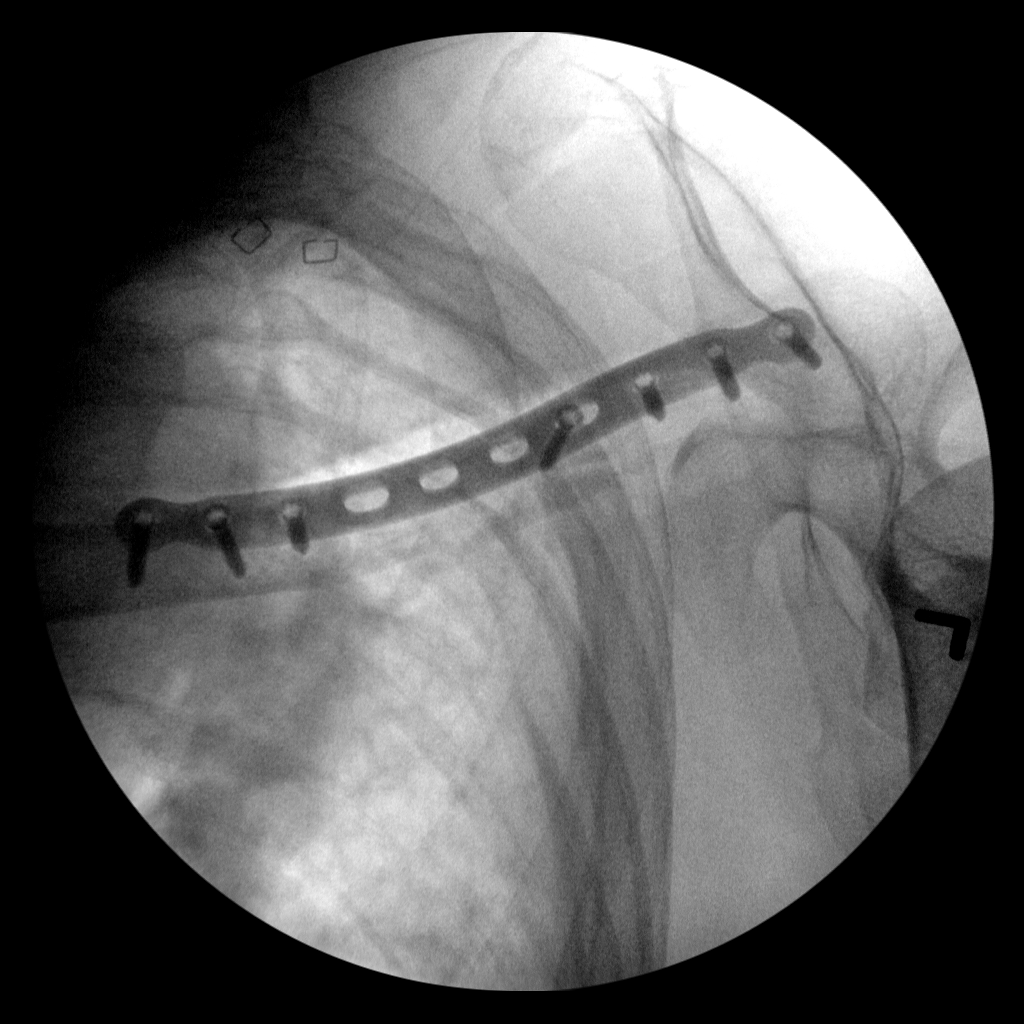

[2 of 2 positions shown; findings below may reference images not displayed]

FINDINGS: Limited intraoperative fluoroscopic spot images demonstrate plate
and screw fixation of the left clavicle with alignment restored.
IMPRESSION: Limited intraoperative fluoroscopic spot images demonstrate ORIF of
left clavicular fracture with restoration of alignment. Please refer
to the dictated operative report for full details of intraoperative
findings and procedure.

## 2021-03-06 IMAGING — RF DG C-ARM 1-60 MIN
1 series · 2 of 2 positions shown · non-contrast
Comparison: April 22, 2011

CLINICAL DATA: 68-year-old male with a history of open reduction
internal fixation clavicle

EXAM:
LEFT CLAVICLE - 2+ VIEWS; DG C-ARM 1-60 MIN

[Series 1: run · 2 of 2 slices shown]
[im 1/2]
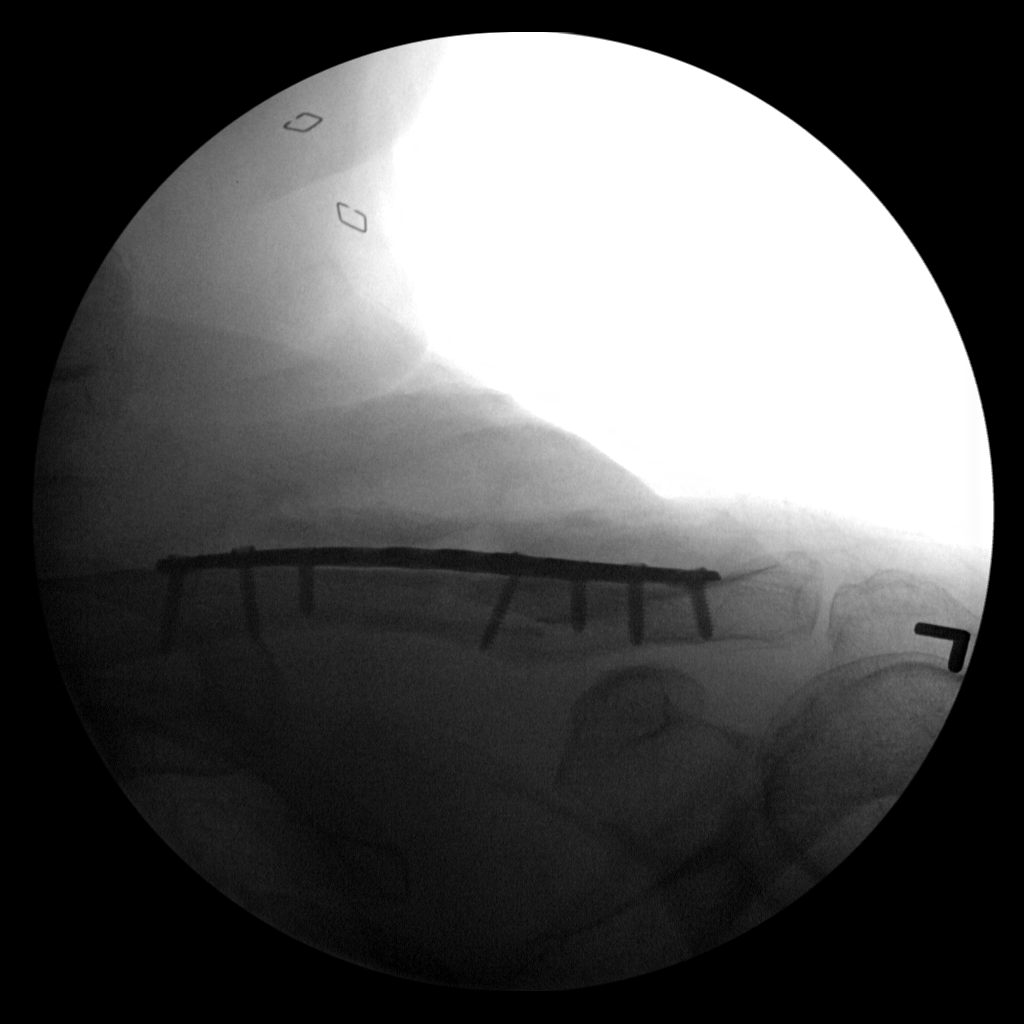
[im 2/2]
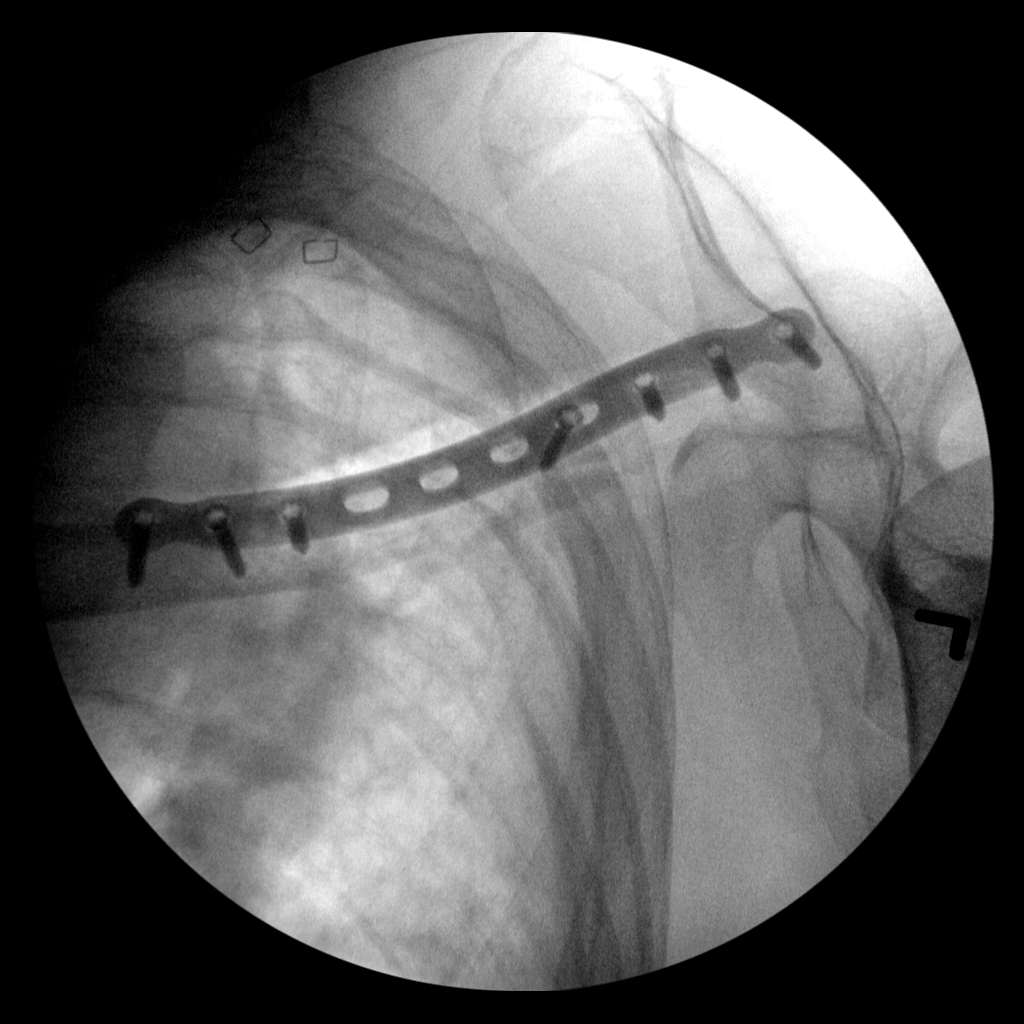

[2 of 2 positions shown; findings below may reference images not displayed]

FINDINGS: Limited intraoperative fluoroscopic spot images demonstrate plate
and screw fixation of the left clavicle with alignment restored.
IMPRESSION: Limited intraoperative fluoroscopic spot images demonstrate ORIF of
left clavicular fracture with restoration of alignment. Please refer
to the dictated operative report for full details of intraoperative
findings and procedure.

## 2021-03-13 ENCOUNTER — Other Ambulatory Visit: Payer: Self-pay | Admitting: *Deleted

## 2021-03-13 ENCOUNTER — Telehealth: Payer: Self-pay | Admitting: *Deleted

## 2021-03-13 MED ORDER — GABAPENTIN 300 MG PO CAPS: 300.0000 mg | ORAL_CAPSULE | Freq: Two times a day (BID) | ORAL | 3 refills | Status: AC

## 2021-03-13 NOTE — Telephone Encounter (Signed)
Mendel Ryder from hospice call stated patient need new Rx Gabapentin  Patient taking 300mg  BID  New Prescription send

## 2021-03-14 NOTE — Telephone Encounter (Signed)
Ok with me. Please place any necessary orders. 

## 2021-03-26 ENCOUNTER — Ambulatory Visit: Payer: Medicare Other | Admitting: Endocrinology

## 2021-04-02 ENCOUNTER — Other Ambulatory Visit: Payer: Self-pay | Admitting: Family Medicine

## 2021-04-08 DEATH — deceased
